# Patient Record
Sex: Female | Born: 1955 | Race: White | Hispanic: No | Marital: Married | Smoking: Former smoker
Health system: Southern US, Academic
[De-identification: ages and names within clinical notes are randomized; demographics above are authoritative.]

## PROBLEM LIST (undated history)

## (undated) DIAGNOSIS — J45909 Unspecified asthma, uncomplicated: Secondary | ICD-10-CM

## (undated) DIAGNOSIS — E785 Hyperlipidemia, unspecified: Secondary | ICD-10-CM

## (undated) DIAGNOSIS — E119 Type 2 diabetes mellitus without complications: Secondary | ICD-10-CM

## (undated) DIAGNOSIS — I10 Essential (primary) hypertension: Secondary | ICD-10-CM

## (undated) DIAGNOSIS — K760 Fatty (change of) liver, not elsewhere classified: Secondary | ICD-10-CM

## (undated) HISTORY — DX: Fatty (change of) liver, not elsewhere classified: K76.0

## (undated) HISTORY — DX: Hyperlipidemia, unspecified: E78.5

---
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
## 1992-12-15 ENCOUNTER — Emergency Department (HOSPITAL_COMMUNITY): Payer: Self-pay

## 2016-07-06 ENCOUNTER — Ambulatory Visit: Payer: No Typology Code available for payment source | Attending: Orthopaedic Surgery | Admitting: Orthopaedic Surgery

## 2016-07-06 ENCOUNTER — Ambulatory Visit (HOSPITAL_BASED_OUTPATIENT_CLINIC_OR_DEPARTMENT_OTHER): Payer: Self-pay | Admitting: Orthopaedic Surgery

## 2016-07-06 VITALS — BP 152/78 | HR 73 | Ht 63.0 in | Wt 195.3 lb

## 2016-07-06 DIAGNOSIS — I10 Essential (primary) hypertension: Secondary | ICD-10-CM | POA: Insufficient documentation

## 2016-07-06 DIAGNOSIS — M898X9 Other specified disorders of bone, unspecified site: Secondary | ICD-10-CM

## 2016-07-06 DIAGNOSIS — Z7984 Long term (current) use of oral hypoglycemic drugs: Secondary | ICD-10-CM | POA: Insufficient documentation

## 2016-07-06 DIAGNOSIS — Z79899 Other long term (current) drug therapy: Secondary | ICD-10-CM | POA: Insufficient documentation

## 2016-07-06 DIAGNOSIS — M7061 Trochanteric bursitis, right hip: Secondary | ICD-10-CM | POA: Insufficient documentation

## 2016-07-06 DIAGNOSIS — E1140 Type 2 diabetes mellitus with diabetic neuropathy, unspecified: Secondary | ICD-10-CM | POA: Insufficient documentation

## 2016-07-06 DIAGNOSIS — Z7982 Long term (current) use of aspirin: Secondary | ICD-10-CM | POA: Insufficient documentation

## 2016-07-06 DIAGNOSIS — Z809 Family history of malignant neoplasm, unspecified: Secondary | ICD-10-CM | POA: Insufficient documentation

## 2016-07-06 MED ORDER — DIAZEPAM 5 MG TABLET
5.0000 mg | ORAL_TABLET | Freq: Four times a day (QID) | ORAL | 0 refills | Status: DC | PRN
Start: 2016-07-06 — End: 2023-02-21

## 2016-07-06 NOTE — Addendum Note (Signed)
Addended by: Lona KettleSKIDMORE, Fedora Knisely L on: 07/06/2016 01:35 PM     Modules accepted: Orders

## 2016-07-07 NOTE — H&P (Signed)
PATIENT NAME: [name] [name], [name] [name]   HOSPITAL NUMBER:  [id number]  DATE OF SERVICE: 07/06/2016  DATE OF BIRTH:  01/09/1956    HISTORY AND PHYSICAL    CHIEF COMPLAINT:  Referral for evaluation of a right femur lesion.    HISTORY OF PRESENT ILLNESS:  [name] [name] is a 61 year old female here for evaluation of a lesion found at her right greater trochanter region.  She reports that this was identified for a workup of right lower extremity pain.  Given the finding of a lytic lesion which was read by radiology as a bone island and her strong family history of cancer, she was referred to our clinic for further evaluation.  She points to the lateral aspect of the thigh as primarily the location of pain just from the greater trochanter down to the lateral thigh.  It is worse with sleeping when she sleeps on her right side, as well as driving a car, extending her leg, and increasing her stride when walking.  It has been going on for a total of 4 months.  She does not report an injury associated with it.  It is gradually getting worse.  She denies any symptoms in the left lower extremity.  She has an associated numb patch just once that occurred yesterday for a brief 30-minute period.  It never occurred again and this was at the mid lateral aspect of the thigh.  She does have baseline diabetic neuropathy; recent A1c was 11.  She denies masses at any locations.  She denies any abnormal weight loss and no prior cancer history for herself.  She denies any bowel or bladder issues. No saddle anesthesia.  She currently, sitting in the room, does not have any pain at said location.  She has been to the chiropractor without alleviation.  She has not had any injections into her back or her hip.  The best thing to alleviate the pain is deep tissue massage to the thigh.    REVIEW OF SYSTEMS:  Positive for visual problems, hearing problems, heartburn. Mood, depression, and anxiety she answers yes to. All other systems are negative, other than  stated in HPI.    PAST MEDICAL HISTORY:  High blood pressure, diabetes, vertigo.    MEDICATIONS:  1. Invokamet.   2. Lotrel.   3. Prilosec.   4. Aspirin 81 mg daily.   5. Vitamin D.   6. Zyrtec.    PAST SURGICAL HISTORY:  C-section, tonsillectomy, left forearm D and C.    ALLERGIES:  1. DILAUDID.    2. FLU SHOT.    3. PENICILLIN for which she does not know the reaction to.    4. She is allergic to LATEX.    SOCIAL HISTORY:  She denies smoking or drinking alcohol.  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    FAMILY HISTORY:  Positive for cancer in several members of her family and also positive for bladder, balance issues, eye issues, diabetes, hypertension, and lung issues.    PHYSICAL EXAMINATION:  Blood pressure 150/78, pulse 73, weight 88.6 kg, height 5 feet 3 inches.   General: No acute distress.   HEENT:  Normocephalic, atraumatic.   Pulmonary:  Nonlabored breathing.    Cardiovascular:  Regular rate.   Abdomen: Nondistended.   Skin:  Warm and dry.   Psych: Her mood is appropriate for clinical situation.   Extremities: Evaluation of the right lower extremity does not show any abrasions, lacerations, or ecchymosis.  She is  most tender about the greater trochanter and at the distal insertion of the gluteus maximus.  She has good range of motion of her hip and is painless.  No radiation of pain into the groin.  She is able to hold a straight leg raise without pain.  She has 5/5 quad strength and 5/5 strength in tib ant, EHL, FHL, and gastroc soleus complex and has normal sensation in L3 to S1 nerve distributions, 0 beats clonus, and negative straight leg raise.  She walks with a normal gait.    IMAGING:  X-ray reviewed on [facility] with Dr. [name] show a sclerotic lesion in the greater trochanter well defined.  There may be some lucency surrounding this area, but it could be some overlap of the greater trochanter.  There are no fractures.  No cortical involvement.  It does have appearance of being a bone  island.    ASSESSMENT:  A 61 year old female with:   1. Right tochanteric bursitis.   2. 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    PLAN:  We discussed with the patient that we believe her pain is primarily due to trochanteric bursitis.  Given that she has the lesion, which does appear to be benign on imaging, though she has a strong family history, we would like to make sure that this is not anything malignant.  We are going to get an MRI of the right pelvis.  We did give her a script for Valium 5 mg to take 1 of those 3 minutes prior to the MRI and the second 1 in addition if she requires it at the time of the MRI.  We will see her back after the MRI is complete for review of the results and further development of the plan.  The patient agrees with the plan and all questions answered.        Lucianne LeiMark C Plumby, MD  Resident   [place] [facility]    Modesto CharonBrock Anthony Nadiya Pieratt, MD  Assistant Professor   [place] [facility]              DD:  07/06/2016 16:21:01  DT:  07/07/2016 08:07:30 KF  D#:  [id number]    I saw and examined the patient.  I reviewed the resident's note.  I agree with the findings and plan of care as documented in the resident's note.  Any exceptions/additions are edited/noted.    [place] CharonBrock Anthony Anastasia Tompson, MD

## 2016-07-18 ENCOUNTER — Telehealth (HOSPITAL_BASED_OUTPATIENT_CLINIC_OR_DEPARTMENT_OTHER): Payer: Self-pay | Admitting: Physician Assistant

## 2016-07-18 NOTE — Telephone Encounter (Signed)
Summary: RE: f/up to MRI     [name] [name] pt-    Pt is calling stating she has an MRI scheduled for 6.15.18 and is wanting to know if Dr. Mardella [name] could see her on 6.18.18?  She states she would have to stay [place] in [place] but is willing to do so.  Please advise if I can schedule pt.    Thanks.         Will attempt to reschedule for 07/25/16 at 9:15 am UTC  Lona KettleStacy L Jabarri Stefanelli, [place]  07/18/2016, 16:11

## 2016-07-21 ENCOUNTER — Telehealth (HOSPITAL_COMMUNITY): Payer: Self-pay | Admitting: Physician Assistant

## 2016-07-21 NOTE — Telephone Encounter (Signed)
Summary: RE: medication for MRI     [name] pt-    Pt is calling stating she thought she was given a script for 2 pills for her to take before her MRI tomorrow and she can not find the script.  She is asking if this can be called into her pharmacy below asap, so she can pick this up tonight.  Thanks.      Preferred Pharmacy   [facility] - [place] - [address]   [address] [place] [id number]   Phone: [phone] Fax: [phone]   Not a 24 hour pharmacy; exact hours not known          I called the patient and she admits she was able to find the prescription and she is taking it now to the pharmacy to have this filled in order to take for her MRI.  She thanked me for the call.  Lona KettleStacy L Iridessa Harrow, PA  07/21/2016, 16:25

## 2016-07-22 ENCOUNTER — Ambulatory Visit
Admission: RE | Admit: 2016-07-22 | Discharge: 2016-07-22 | Disposition: A | Discharge status: Home / Self Care | Payer: No Typology Code available for payment source | Source: Ambulatory Visit | Attending: Orthopaedic Surgery | Admitting: Orthopaedic Surgery

## 2016-07-22 DIAGNOSIS — S73191A Other sprain of right hip, initial encounter: Secondary | ICD-10-CM

## 2016-07-22 DIAGNOSIS — M7061 Trochanteric bursitis, right hip: Secondary | ICD-10-CM

## 2016-07-22 DIAGNOSIS — Z809 Family history of malignant neoplasm, unspecified: Secondary | ICD-10-CM

## 2016-07-22 DIAGNOSIS — M898X9 Other specified disorders of bone, unspecified site: Secondary | ICD-10-CM

## 2016-07-25 ENCOUNTER — Encounter: Payer: Self-pay | Admitting: Orthopaedic Surgery

## 2016-07-25 ENCOUNTER — Ambulatory Visit: Payer: No Typology Code available for payment source | Attending: Orthopaedic Surgery | Admitting: Orthopaedic Surgery

## 2016-07-25 VITALS — BP 123/64 | HR 78 | Temp 97.9°F | Ht 63.39 in | Wt 190.5 lb

## 2016-07-25 DIAGNOSIS — M7061 Trochanteric bursitis, right hip: Secondary | ICD-10-CM | POA: Insufficient documentation

## 2016-07-25 DIAGNOSIS — M7060 Trochanteric bursitis, unspecified hip: Secondary | ICD-10-CM

## 2016-07-25 DIAGNOSIS — M898X9 Other specified disorders of bone, unspecified site: Secondary | ICD-10-CM

## 2016-07-25 DIAGNOSIS — M898X5 Other specified disorders of bone, thigh: Secondary | ICD-10-CM | POA: Insufficient documentation

## 2016-07-25 NOTE — Progress Notes (Signed)
PATIENT NAME: Dallie DadHATFIELD, Liliann S  HOSPITAL NUMBER:  [id number]  DATE OF SERVICE: 07/25/2016  DATE OF BIRTH:  03-18-1955    PROGRESS NOTE    SUBJECTIVE:  Ms. [name] [name] is a 61 year old female who is here for followup of MRI of her right pelvis.  This was obtained due to concern for a lesion in the right greater trochanter region.  It was incidentally found due to right lower extremity pain.  It is suspicious for a bone infarct; however, given a few other abnormal findings on x-ray and a strong family history of cancer, MRI was obtained.  Regarding her right hip pain, we thought it was related to trochanteric bursitis given the location of the pain with laying on that side and tenderness to palpation at that location.  This pain has not gotten any better.  No worse.  It is irritating her and bothers her on a daily basis.    OBJECTIVE:  No acute distress.  Head and neck atraumatic mood is appropriate for the clinical situation.  Evaluation of the right lower extremity, there are no abrasions, lacerations or ecchymosis.  She does have tenderness to palpation at the greater trochanter that is reproduced just a little bit distally down the IT band in the vastus lateralis.  She is neurovascularly intact distally.    IMAGING:  MRI was reviewed on PACS with Dr. [name] and shows the lesion on the greater trochanter. It is dark on T1, dark on T2 well-circumscribed.  There is some enhancement at the greater troch at the location of the troch bursitis.      ASSESSMENT:  A 61 year old female with:  1. Bone island, right greater trochanter.  2. Right trochanteric bursitis.    PLAN:  We discussed with the patient that the lesion is benign.  There is nothing to do for it.  We will see her back in 3 months.  We will get an x-ray of the right hip just to monitor it.  For the trochanteric bursitis, we gave her physical therapy script to work on stretching the IT band and general lower extremity stretching.  We will see her back in 3  months to see how she is doing and get the x-ray.  The patient agrees with the plan and all questions answered.        Lucianne LeiMark C Plumby, MD  Resident   [facility]    [place] CharonBrock Anthony Kaeya Schiffer, MD  Assistant Professor   [place] [facility]              DD:  07/25/2016 09:49:05  DT:  07/25/2016 19:13:46 JB  D#:  [id number]    I saw and examined the patient.  I reviewed the resident's note.  I agree with the findings and plan of care as documented in the resident's note.  Any exceptions/additions are edited/noted.    [place] CharonBrock Anthony Floriene Jeschke, MD

## 2016-07-28 ENCOUNTER — Other Ambulatory Visit (HOSPITAL_BASED_OUTPATIENT_CLINIC_OR_DEPARTMENT_OTHER): Payer: Self-pay

## 2016-08-03 ENCOUNTER — Other Ambulatory Visit (INDEPENDENT_AMBULATORY_CARE_PROVIDER_SITE_OTHER): Payer: Self-pay | Admitting: Radiology

## 2016-08-03 ENCOUNTER — Encounter (INDEPENDENT_AMBULATORY_CARE_PROVIDER_SITE_OTHER): Payer: No Typology Code available for payment source | Admitting: Orthopaedic Surgery

## 2016-10-31 ENCOUNTER — Encounter (HOSPITAL_BASED_OUTPATIENT_CLINIC_OR_DEPARTMENT_OTHER): Payer: Self-pay | Admitting: Orthopaedic Surgery

## 2016-10-31 ENCOUNTER — Ambulatory Visit (HOSPITAL_BASED_OUTPATIENT_CLINIC_OR_DEPARTMENT_OTHER): Payer: Self-pay | Admitting: Orthopaedic Surgery

## 2016-10-31 DIAGNOSIS — M7060 Trochanteric bursitis, unspecified hip: Secondary | ICD-10-CM

## 2016-10-31 NOTE — Telephone Encounter (Signed)
-----   Message from Risa Grill sent at 10/31/2016  8:37 AM EDT -----  Mardella Layman pt    Pt is asking for a new PT order.    Fax: [phone] / attnShanda Bumps @ [employer]    thanks

## 2016-11-01 ENCOUNTER — Telehealth (INDEPENDENT_AMBULATORY_CARE_PROVIDER_SITE_OTHER): Payer: Self-pay | Admitting: Physician Assistant

## 2016-11-01 NOTE — Telephone Encounter (Signed)
Message from Fredderick Erb sent at 11/01/2016 12:57 PM EDT      Summary: RE: order for PT     Mardella Layman ptShanda Bumps from [facility] is requesting an order for PT to be faxed to her.  Pt has an appt today at 2:30 and pt will need to be rescheduled if she doesn't receive it.     Fax: [phone].    Thanks.                   Call History         Type Contact Phone User     11/01/2016 12:55 PM Phone (Incoming) [facility] (Other) [phone] Fredderick Erb         PT order dated 10/31/16 faxed to above number.  Nilda Calamity, PA-C  11/01/2016, 13:06

## 2016-11-28 ENCOUNTER — Ambulatory Visit (HOSPITAL_BASED_OUTPATIENT_CLINIC_OR_DEPARTMENT_OTHER): Payer: Self-pay | Admitting: Orthopaedic Surgery

## 2016-11-28 ENCOUNTER — Ambulatory Visit
Admission: RE | Admit: 2016-11-28 | Discharge: 2016-11-28 | Disposition: A | Discharge status: Home / Self Care | Payer: No Typology Code available for payment source | Source: Ambulatory Visit | Attending: Orthopaedic Surgery | Admitting: Orthopaedic Surgery

## 2016-11-28 ENCOUNTER — Ambulatory Visit (HOSPITAL_BASED_OUTPATIENT_CLINIC_OR_DEPARTMENT_OTHER): Payer: No Typology Code available for payment source | Admitting: Orthopaedic Surgery

## 2016-11-28 ENCOUNTER — Ambulatory Visit (HOSPITAL_BASED_OUTPATIENT_CLINIC_OR_DEPARTMENT_OTHER): Payer: No Typology Code available for payment source

## 2016-11-28 ENCOUNTER — Encounter (HOSPITAL_BASED_OUTPATIENT_CLINIC_OR_DEPARTMENT_OTHER): Payer: Self-pay

## 2016-11-28 VITALS — BP 142/62 | HR 63 | Temp 97.3°F | Ht 64.29 in | Wt 184.3 lb

## 2016-11-28 DIAGNOSIS — M7061 Trochanteric bursitis, right hip: Secondary | ICD-10-CM | POA: Insufficient documentation

## 2016-11-28 DIAGNOSIS — M899 Disorder of bone, unspecified: Secondary | ICD-10-CM | POA: Insufficient documentation

## 2016-11-28 DIAGNOSIS — M25551 Pain in right hip: Secondary | ICD-10-CM

## 2016-11-28 DIAGNOSIS — M898X9 Other specified disorders of bone, unspecified site: Secondary | ICD-10-CM

## 2016-11-28 NOTE — Addendum Note (Signed)
Addended by: [place] CharonLINDSEY, Aveline Daus ANTHONY on: 11/28/2016 09:31 AM     Modules accepted: Orders

## 2016-11-28 NOTE — Progress Notes (Signed)
PATIENT NAME: [name] [name], [name] [name]  HOSPITAL NUMBER:  [id number]  DATE OF SERVICE: 11/28/2016  DATE OF BIRTH:  [birth year]    PROGRESS NOTE    CHIEF COMPLAINT:  Follow up right bone lesion.    SUBJECTIVE:  This is a 61 year old female, who is here for followup of her right bone lesion.  We had diagnosed her with a bone island of her right greater trochanter last visit.  We also diagnosed her with some right trochanteric bursitis, as well as IT band irritation.  She has been doing PT for these issues.  It has helped some, but she continues to have pain in her back and down the lateral side of her leg.  She has no pain in her hip.  No paresthesias down the leg.  No numbness or tingling.    OBJECTIVE:  Well-appearing female, in no acute distress.  In regards to right lower extremity, she is neurovascularly intact distally.  She has negative straight leg raise.  She has good internal and external rotation of her hip.  This does not really cause her much pain.  She is not really tender over her trochanteric bursa.  She does have some low back tenderness. BCR foot. SILT distal foot. Able to dorsiflex and plantar flex foot.    IMAGING:  X-rays were obtained today and reviewed in PACS with Dr. [name].  Does show that this bone lesion is stable and appears to be a bone island.    ASSESSMENT:  This is a 61 year old female with a bone island of her right greater trochanter and some low back hamstring issues.    PLAN:  The lesion is stable and benign.  There is nothing to do for it.  We will have her follow up on an as needed basis.  In regards to her pain, we did give her a physical therapy script to work on core strengthening, back strengthening, and stretching and hamstring stretching to hopefully help out with some of her low back issues and her hamstring pain.  We will have the patient follow up on an as-needed basis.  The patient is agreeable with the plan.  All questions were answered.        Gray BernhardtLunden Ryan, MD      [place] CharonBrock  Anthony Ervine Witucki, MD  Assistant Professor   [facility]              DD:  11/28/2016 10:07:28  DT:  11/28/2016 16:41:21 LB  D#:  [id number]    I saw and examined the patient.  I reviewed the resident'[name] note.  I agree with the findings and plan of care as documented in the resident'[name] note.  Any exceptions/additions are edited/noted.    [place] CharonBrock Anthony Andreya Lacks, MD

## 2016-11-28 NOTE — Addendum Note (Signed)
Addended by: Gray BernhardtYAN, Emilyanne Mcgough on: 11/28/2016 09:06 AM     Modules accepted: Orders

## 2020-01-15 ENCOUNTER — Other Ambulatory Visit (HOSPITAL_COMMUNITY): Payer: Self-pay

## 2020-01-15 LAB — EXTERNAL COVID-19 MOLECULAR RESULT: External 2019-n-CoV/SARS-CoV-2: POSITIVE — AB

## 2020-10-15 ENCOUNTER — Ambulatory Visit (HOSPITAL_COMMUNITY)
Admission: RE | Admit: 2020-10-15 | Discharge: 2020-10-15 | Disposition: A | Discharge status: Home / Self Care | Payer: Self-pay | Source: Ambulatory Visit | Admitting: Radiology

## 2021-01-25 ENCOUNTER — Ambulatory Visit (HOSPITAL_COMMUNITY)
Admission: RE | Admit: 2021-01-25 | Discharge: 2021-01-25 | Disposition: A | Discharge status: Home / Self Care | Payer: Self-pay | Source: Ambulatory Visit | Admitting: Radiology

## 2021-02-16 ENCOUNTER — Inpatient Hospital Stay
Admission: RE | Admit: 2021-02-16 | Discharge: 2021-02-16 | Disposition: A | Discharge status: Home / Self Care | Payer: Medicare Other | Source: Ambulatory Visit | Attending: Surgery | Admitting: Surgery

## 2021-02-16 ENCOUNTER — Ambulatory Visit (HOSPITAL_BASED_OUTPATIENT_CLINIC_OR_DEPARTMENT_OTHER): Payer: Self-pay | Admitting: Surgery

## 2021-02-16 DIAGNOSIS — R928 Other abnormal and inconclusive findings on diagnostic imaging of breast: Secondary | ICD-10-CM

## 2021-02-23 ENCOUNTER — Other Ambulatory Visit: Payer: Self-pay

## 2021-02-23 ENCOUNTER — Encounter (HOSPITAL_BASED_OUTPATIENT_CLINIC_OR_DEPARTMENT_OTHER): Payer: Self-pay | Admitting: Surgery

## 2021-02-23 ENCOUNTER — Ambulatory Visit: Payer: Medicare Other | Attending: Surgery | Admitting: Surgery

## 2021-02-23 VITALS — BP 141/94 | HR 74 | Temp 96.3°F | Resp 18 | Ht 63.0 in | Wt 195.9 lb

## 2021-02-23 DIAGNOSIS — R079 Chest pain, unspecified: Secondary | ICD-10-CM | POA: Insufficient documentation

## 2021-02-23 DIAGNOSIS — Z6834 Body mass index (BMI) 34.0-34.9, adult: Secondary | ICD-10-CM

## 2021-02-23 DIAGNOSIS — Z803 Family history of malignant neoplasm of breast: Secondary | ICD-10-CM | POA: Insufficient documentation

## 2021-02-23 DIAGNOSIS — N644 Mastodynia: Secondary | ICD-10-CM | POA: Insufficient documentation

## 2021-02-23 DIAGNOSIS — R0789 Other chest pain: Secondary | ICD-10-CM | POA: Insufficient documentation

## 2021-02-23 MED ORDER — FLAXSEED ORAL POWDER
25.0000 g | Freq: Every day | ORAL | 1 refills | Status: DC
Start: 2021-02-23 — End: 2023-02-21

## 2021-02-23 NOTE — Cancer Center Note (Unsigned)
BREAST SURGICAL ONCOLOGY CONSULTATION (H&P)    NAME:  [name] [name]   DOB:  1955-07-17  MRN:  [id number]  Date of service:   02/23/21    PCP: [facility]  Address: [address] [place] [address]    Chief Complaint: pain, left breast greater than right    HPI: This is a very pleasant 66 y.o. woman who presents to the [facility] for evaluation today of her self-detected, bilateral breast pain.  She presents with her daughter who provides minimal history.  The patient does obtain regular screening mammograms about every 1-2 years. She denies symptoms prior to her most recent [facility] MGM in September 2022, specifically she reports, no pain, no skin changes, no contralateral breast masses, no nipple discharge.  She denies a personal history of breast cancer and denies prior radiation treatment to the chest.  She denies prior breast core biopsy and denies prior breast surgery.    She presents today for pain and discomfort in the bilateral breasts.  This began on the left side in November 2022 (two months after screening mammogram showing only bilateral nonsuspicious intramammary lymph nodes) when she first noticed discomfort.  Discomfort started mostly on the left side, then later on she noticed on the right side as well.    Burning and pain.  Pain described as pain-pain and sometimes tenderness to touch.  Upon further questioning, she describes the pain as moderate.  Dull ache.  Has pain right now.  Pain not present all the time. Sometimes has no pain for 1-2 days but then returns.  At the time of onset, pain was present all the time, so overall improving. Reports holding breast did not help much.  Pain was sometimes worse than others.  No pattern noticed, and she could not pinpoint anything that would be associated with it.  Sometimes during housecleaning and sometimes at rest.  Mostly pain lasts for hours, sometimes will hurt only 20 minutes and then stop.  Pain reaches from sternum across top of  breast into upper outer quadrant and into armpit area and up towards shoulder at times.  Does not radiate down arm. Does not radiate to nipple, although she had some of the burning sensation to the nipple but no shooting pains.  Burning sensation is now only rarely.      Has not tried any remedies other than occasional aleve for severe pain that interferred with sleeping.    Had a fall in 2005 with C5-C6 injury, received PT and was off of work for a year.  Occasionally gets a massage for intermittent neck pain.  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     Her diagnostic workup and biopsy details are below.     Breast imaging and pathology summary:  LAST SCR MGM with tomosynethesis: 10/15/2020, findings include scattered fibroglandular densities (breast tissue density B). Bilateral breast - no mammographic abnormality in either breast.  Benign focal asymmetric density identified in LEFT breast, apparently stable. BI-RADS 2.      01/25/2021 Left breast US - few ovoid masses in UOW up to 11 mm in diameter, c/w intramammary lymph nodes.  No masses or cysts appreciated.  BI-RADS 2.    01/25/2021 Right breast US - 8 x 29 mm isoechoic to hyperechoic mass in right axillary tail, suggestive of lymph  node.  Otherwise no masses or cysts.  BI-RADS 0.    Pertinent family cancer history (based on three-generation family history obtained today):  MATERNAL FAMILY HISTORY  Breast:  Yes: mother, dx 58, deceased 70; maternal aunt, dx 63's, deceased 33's. Mother had 3 sisters and 9 brothers.    Ovarian: No  Pancreatic: No  Prostate: No  Melanoma: No  Patient reports she had nine (9) uncles, most of whom had cancer but she is unaware of type or ages.     PATERNAL FAMILY HISTORY  Breast:  No  Ovarian: No  Pancreatic: No  Prostate: No  Melanoma: No    Ashkenazi Jewish ancestry: none  Prior genetic testing:  none  Patient's son diagnosed with melanoma-in-situ    Breast Cancer Risk Assessment:  Menarche: 33, G2P2, first live birth at 39  postmenopausal (natural at age 74)  Hormone replacement therapy: No  Prior radiation to the chest: No        ROS: 13-system review is reported by patient, please see scanned New Patient Questionnaire.  She admits to night sweats for 20 years, weight fluctuation (up and down) of about 20 lbs, vertigo, chronic shortness of breath, chronic bronchitis, likely sleep apnea (no evaluation), nausea, GERD, h/o stomach ulcers, h/o colonoscopy, arthritis, rheumatism, back pain, weakness in the arms or legs, diabetes, anxiety/depression, and difficulty sleeping.  Remainder of review is negative.     PMH:  has no past medical history on file.  PSHx: No past surgical history on file.  SocHx:  reports that she has never smoked. She has never used smokeless tobacco. Retired and divorced.  Smoked 1 ppd until March 1999.   Allergies: is allergic to dilaudid [hydromorphone] and penicillins.  Current Outpatient Medications   Medication Sig   . Amlodipine-Benazepril 10-40 mg Oral Capsule Take 1 Cap by mouth Once a day   . aspirin (ECOTRIN) 81 mg Oral Tablet, Delayed Release (E.C.) Take 81 mg by mouth Once a day   . canagliflozin-metformin (INVOKAMET XR) 50-1,000 mg Oral tablet, IR & ER, biphasic 24hr Take by mouth Once a day   . ciprofloxacin HCl (CIPRO) 500 mg Oral Tablet Take 500 mg by mouth Twice daily   . diazePAM (VALIUM) 5 mg Oral Tablet Take 1 Tab (5 mg total) by mouth Every 6 hours as needed for Anxiety for up to 1 dose Take one pill 30 minutes prior to MRI  Take second pill at time of MRI if needed   . loratadine (CLARITIN) 10 mg Oral Tablet Take 10 mg by mouth Once a day   . omeprazole (PRILOSEC) 40 mg Oral Capsule, Delayed Release(E.C.) Take 1 Capsule (40 mg total) by mouth Once a day   . OZEMPIC 0.25 mg or 0.5 mg(2 mg/1.5 mL) Subcutaneous Pen Injector    . SYNJARDY 12.5-1,000 mg Oral Tablet    .  TRULICITY 1.5 99991111 mL Subcutaneous Pen Injector      Physical Exam:  Vital signs: BP (!) 141/94   Pulse 74   Temp 35.7 C (96.3 F) (Thermal Scan)   Resp 18   Ht 1.6 m (5\' 3" )   Wt 88.9 kg (195 lb 14.4 oz)   SpO2 96%   BMI 34.70 kg/m   GEN: Awake, alert, and oriented.  No apparent distress.  Climbs on and off the examining table without assistance.    HEENT: Normocephalic, atraumatic. EOMI. She is wearing a mask.    CV:  RRR, pedal pulses are palpable. Lower extremity edema absent.  PULM: normal inspiratory effort.   GI: Soft, not tender to palpation, no hepatomegaly.    M/S: Range of motion of bilateral appears normal.  No obvious extremity retraction or unilateral swelling.  She does not require mobility assistance and is able to ambulate independently. She has no C-spine tenderness nor stepoffs.  She has mild TTP along the thoracic spine, more so along the paraspinous muscles bilaterally.  Her lower trapezius and latissimus muscles are tender extending into the rhomboid area.  The deltoid muscles are not tender.   PSYCH: Appearance is well-groomed and calm.  Judgement appears intact.  Affect is appropriate to context.      LYMPHATICS: Cervical and supraclavicular lymph node basins palpation without adenopathy bilaterally. Right axilla palpates with no palpable nodes or concern for adenopathy. Left axilla palpates with no palpable nodes or concern for adenopathy.  She is tender to palpation in the deeper axillary tissues, along the lateral and posterior aspect of the pectoralis, the latissimus, and serratus muscles.      BREAST EXAM: She is examined seated, supine, and with motion of the pectoralis major muscles bilaterally.  On gross inspection, she has mild asymmetry.  The left breast is larger than the right minimally.  She has flattened C cup volume and grade 2/3 ptosis.  No retraction, dimpling, or bulge noted.  The nipple-areolar complexes are normal without flaking or rash.  I am unable to elicit  drainage from the bilateral nipples.    On palpation of the left breast, there is no palpable abnormality in the breast parenchyma, and isolation off the pectoralis results in very minimal tenderness while palpating the parenchyma.       On palpation of the right breast, similarly there is no palpable abnormality in the breast parenchyma, and isolation off the pectoralis results in very minimal tenderness while palpating the parenchyma.       IMAGING:  Outside review 02/25/2021:  Review of outside mammogram and breast ultrasound  images performed on October 15, 2020 and January 25, 2021.  Please note that CAD is not available for the review of these outside images.  FILMS COMPARED:  No comparisons were made when reading this study.  FINDINGS:  Mammogram:  The breasts have scattered areas of fibroglandular density.    Left  Asymmetry: There are 2 asymmetries seen in the central region of the left breast in the posterior depth on the CC view.  No tomosynthesis images are available and there are no prior examinations available for comparison.  Spot compression is recommended.    Right  There is no evidence of suspicious masses, calcifications, or other abnormal findings.    Ultrasound:  Ultrasound images of both breast demonstrate no mass or discrete lesion.  There are normal-appearing lymph nodes in both axilla.    IMPRESSION:  Special Views: Spot Compression Mammography with Tomosynthesis is recommended for the left breast.  Follow up mammogram in 1 year is recommended for the right breast.  Any further work up should be based on clinical findings is recommended for both breasts.      BI-RADS ATLAS category (left): 0 - Incomplete: Needs Additional Imaging Evaluation  BI-RADS ATLAS category (right): 1 - Negative    Tyrer-Cuzick Risk estimate:  10-year risk 4.7% (population 3.6%)  Lifetime risk 9.7% (population 7.4%)    ASSESSMENT/PLAN:    ICD-10-CM    1. Left-sided chest wall pain  R07.89 Refer to Physical  Therapy at [facility]     AMB CONSULT/REFERRAL MASSAGE  THERAPY-EXT      2. Breast pain, left  N64.4       3. Breast pain, right  N64.4       4. Family history of breast cancer  Z80.3            This is a pleasant 66 y.o. woman with family history of postmenopausal breast cancer who presents for bilateral breast pain (mastalgia).  Clinical breast exam shows no findings of concern, and her diagnostic mammogram likewise showed no breast or axillary findings suggestive of malignancy.  She is at mildly elevated risk for breast cancer based on her maternal family history; her lifetime risk for breast cancer based on the Tyrer-Cuzick risk model does not meet the 20% threshold to consider adding breast MRI to her annual screening mammograms.  Clinical breast exams should be considered every 6-12 months along with annual bilateral screening mammograms as per NCCN guidelines.      I discussed with the patient that I suspect her breast pain is related more to her underlying chest wall musculature and not so much her breast parenchyma.  I recommend she try symptomatic therapy for her breast pain; she was encouraged to continue the measures already in place (supportive sports bra, ice or heat as is comfortable, sleeping in a sports bra, and OTC medications such as tylenol and ibuprofen).  I recommend adding oral flaxseed powder added to her diet.  I explained that flaxseed powder has been shown in small clinical trials to reduce breast pain significantly over 1-2 months and to not expect a rapid response.  I also reviewed that breast pain is uncommonly associated with breast cancer and so my suspicion for malignancy is very low.  I reviewed that mastalgia is often idiopathic, and often spontaneously resolves after 4-6 months.  I strongly encouraged her to pursue these options and return in 8 weeks for a repeat exam to ensure stability and assess response to symptomatic therapy.  She will be contacted and asked to  return sooner should our radiologists' review of the referring institution's imaging prompt the need for additional diagnostic workup.  Furthermore, I suspect an element of her pain is related to her history of neck trauma and possible shoulder issues, both of which can cause pain into the ipsilateral breast.  I recommend she consider physical therapy and I provided her a prescription/referral for both PT and massage therapy.    She and her daughter were given the opportunity to ask questions and voiced understanding of the above.  She is comfortable with the plan discussed.  She will be contacted to schedule followup diagnostic mammogram based on our radiologist's secondary review of her breast imaging.  Otherwise, she will return in about 8 weeks for followup exam, sooner if changes occur.      Greater than 50% of the face-to-face 30-minute visit was spent in counseling and coordination of care.      PLAN:    - patient to investigate maternal family history as she is comfortable   - Physical therapy for neck/shoulder pain as likely source of breast pain   - Massage therapy for same   - Return in 8 weeks for followup (short interval) visit    Domenick Gong, MD 02/23/2021 15:40  Assistant Professor, Division of Surgical Oncology (Breast Surgery)  [facility] Department of Surgery  Pager [phone], cell [phone]    10 minutes spent on independent review of imaging and reports, pathology results, and other provider notes prior to visit on  same day   30 minutes spent in face-to-face patient evaluation and counseling  0 minutes spent in communication with other cancer care physician team members  15 minutes spent in documentation on the day of this visit   Total time in counseling and coordination of care on day of visit: 55 minutes

## 2021-02-25 ENCOUNTER — Other Ambulatory Visit (HOSPITAL_COMMUNITY): Payer: Self-pay

## 2021-02-26 ENCOUNTER — Other Ambulatory Visit (HOSPITAL_BASED_OUTPATIENT_CLINIC_OR_DEPARTMENT_OTHER): Payer: Self-pay | Admitting: Surgery

## 2021-02-26 DIAGNOSIS — R928 Other abnormal and inconclusive findings on diagnostic imaging of breast: Secondary | ICD-10-CM

## 2021-03-02 ENCOUNTER — Ambulatory Visit (HOSPITAL_BASED_OUTPATIENT_CLINIC_OR_DEPARTMENT_OTHER): Payer: Self-pay | Admitting: Surgery

## 2021-04-20 ENCOUNTER — Ambulatory Visit (INDEPENDENT_AMBULATORY_CARE_PROVIDER_SITE_OTHER): Payer: Self-pay | Admitting: Rehabilitative and Restorative Service Providers"

## 2021-04-20 ENCOUNTER — Other Ambulatory Visit: Payer: Self-pay

## 2021-04-20 ENCOUNTER — Encounter (HOSPITAL_BASED_OUTPATIENT_CLINIC_OR_DEPARTMENT_OTHER): Payer: Self-pay | Admitting: Surgery

## 2021-04-20 ENCOUNTER — Inpatient Hospital Stay (HOSPITAL_BASED_OUTPATIENT_CLINIC_OR_DEPARTMENT_OTHER)
Admission: RE | Admit: 2021-04-20 | Discharge: 2021-04-20 | Disposition: A | Discharge status: Home / Self Care | Payer: Medicare Other | Source: Ambulatory Visit

## 2021-04-20 ENCOUNTER — Ambulatory Visit: Payer: Medicare Other | Attending: Surgery | Admitting: Surgery

## 2021-04-20 ENCOUNTER — Ambulatory Visit (HOSPITAL_BASED_OUTPATIENT_CLINIC_OR_DEPARTMENT_OTHER): Payer: Medicare Other | Admitting: Surgery

## 2021-04-20 VITALS — BP 148/56 | HR 68 | Temp 96.2°F | Resp 18 | Ht 63.0 in | Wt 190.7 lb

## 2021-04-20 DIAGNOSIS — N644 Mastodynia: Secondary | ICD-10-CM | POA: Insufficient documentation

## 2021-04-20 DIAGNOSIS — N6489 Other specified disorders of breast: Secondary | ICD-10-CM

## 2021-04-20 DIAGNOSIS — Z803 Family history of malignant neoplasm of breast: Secondary | ICD-10-CM | POA: Insufficient documentation

## 2021-04-20 DIAGNOSIS — R928 Other abnormal and inconclusive findings on diagnostic imaging of breast: Secondary | ICD-10-CM

## 2021-04-20 NOTE — Progress Notes (Signed)
BREAST CLINIC, [address] [place]  [place] [id number]  Operated by [facility]  History and Physical     Name: [name] [name] MRN:  [id number]   Date: 04/20/2021 Age: 66 y.o.       Chief Complaint: No chief complaint on file.    History of Present Illness   Ms. Treminio is a 66 y.o. year old female who comes to clinic 8 weeks after initial visit for bilateral mastalgia, left pain worse than right. From her prior clinic visit,     This is a very pleasant 65 y.o. woman who presents to the [facility] for evaluation today of her self-detected, bilateral breast pain.  She presents with her daughter who provides minimal history.  The patient does obtain regular screening mammograms about every 1-2 years. She denies symptoms prior to her most recent [facility] MGM in September 2022, specifically she reports, no pain, no skin changes, no contralateral breast masses, no nipple discharge.  She denies a personal history of breast cancer and denies prior radiation treatment to the chest.  She denies prior breast core biopsy and denies prior breast surgery.    She presents today for pain and discomfort in the bilateral breasts.  This began on the left side in November 2022 (two months after screening mammogram showing only bilateral nonsuspicious intramammary lymph nodes) when she first noticed discomfort.  Discomfort started mostly on the left side, then later on she noticed on the right side as well.    Burning and pain.  Pain described as pain-pain and sometimes tenderness to touch.  Upon further questioning, she describes the pain as moderate.  Dull ache.  Has pain right now.  Pain not present all the time. Sometimes has no pain for 1-2 days but then returns.  At the time of onset, pain was present all the time, so overall improving. Reports holding breast did not help much.  Pain was sometimes worse than others.  No pattern noticed, and she could not pinpoint  anything that would be associated with it.  Sometimes during housecleaning and sometimes at rest.  Mostly pain lasts for hours, sometimes will hurt only 20 minutes and then stop.  Pain reaches from sternum across top of breast into upper outer quadrant and into armpit area and up towards shoulder at times.  Does not radiate down arm. Does not radiate to nipple, although she had some of the burning sensation to the nipple but no shooting pains.  Burning sensation is now only rarely.      Has not tried any remedies other than occasional aleve for severe pain that interferred with sleeping.    Had a fall in 2005 with C5-C6 injury, received PT and was off of work for a year.  Occasionally gets a massage for intermittent neck pain.  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     Today, she reports that 3 weeks after her prior visit, her breast pain subsided. She reported migratory dull pain in varying locations. She also reported isolated right wrist swelling and erythema while out of town. She received a steroid shot which resolved those symptoms. She recently underwent cervical spine and lumbar spine XRays, which revealed some mild degenerative changes. She denies upper extremity neuropathy. She reports that massage has  helped alleviate pain.     Patient Active Problem List    Diagnosis   . Left-sided chest wall pain   . Breast pain, left   . Breast pain, right   . Family history of breast cancer       Current Outpatient Medications   Medication Sig   . Amlodipine-Benazepril 10-40 mg Oral Capsule Take 1 Cap by mouth Once a day   . aspirin (ECOTRIN) 81 mg Oral Tablet, Delayed Release (E.C.) Take 81 mg by mouth Once a day   . canagliflozin-metformin (INVOKAMET XR) 50-1,000 mg Oral tablet, IR & ER, biphasic 24hr Take by mouth Once a day   . diazePAM (VALIUM) 5 mg Oral Tablet Take 1 Tab (5 mg  total) by mouth Every 6 hours as needed for Anxiety for up to 1 dose Take one pill 30 minutes prior to MRI  Take second pill at time of MRI if needed   . Flaxseed Oral Powder Take 25 g by mouth Once a day   . loratadine (CLARITIN) 10 mg Oral Tablet Take 10 mg by mouth Once a day   . omeprazole (PRILOSEC) 40 mg Oral Capsule, Delayed Release(E.C.) Take 1 Capsule (40 mg total) by mouth Once a day   . SYNJARDY 12.5-1,000 mg Oral Tablet    . TRULICITY 1.5 99991111 mL Subcutaneous Pen Injector      Allergies   Allergen Reactions   . Dilaudid [Hydromorphone]    . Penicillins      Family Medical History:    None         Social History     Tobacco Use   . Smoking status: Never   . Smokeless tobacco: Never   Substance Use Topics   . Alcohol use: Not on file      Review of Systems  A 12 point review of systems was conducted and was negative except as listed in HPI    Examination:  BP (!) 148/56   Pulse 68   Temp 35.7 C (96.2 F) (Thermal Scan)   Resp 18   Ht 1.6 m (5\' 3" )   Wt 86.5 kg (190 lb 11.2 oz)   SpO2 94%   BMI 33.78 kg/m     General: Alert, oriented, no acute distress  HEENT: Normocephalic, atraumatic, cervical spine non-tender, full range of motion, no step-offs or deformities  CV: Regular rate and rhythm  Pulm: No accessory muscle use or dyspnea  LEFT breast: no masses, no dimpling, no skin changes, no adenopathy, no nipple inversion  RIGHT breast: palpable areas of normal breast tissue, no skin changes, adenopathy, or nipple inversion  MSK: Full ROM in upper and lower extremities    Data reviewed:  Left diagnostic mammogram:    FINDINGS:  The left breast has scattered areas of fibroglandular density.    Left  Asymmetry: There are 2 similar asymmetries seen in the central region of the left breast in the posterior depth on the CC view.  Prior films are now available for comparison, and these two asymmetries are stable back to at least 2019.    IMPRESSION:  Return to regular screening mammogram schedule is  recommended for the left breast.    BI-RADS ATLAS category (left): 2 - Benign  Assessment and Plan  Problem List Items Addressed This Visit    None    This is a 67 year old female presenting who had a 2 month history of bilateral mastalgia. Since her prior visit, her mastalgia has resolved.  She denies any other concerning breast symptoms. From her history and examination, primary breast pathology is unlikely. Her symptoms seem more consistent with musculoskeletal pain. She will discuss this further with her PCP who is recommending MRI per patient report. We recommend continuing annual screening mammograms and watching for any concerning breast changes.     Lucile Crater, MD     Breast surgery attending note  I was physically present and examined the patient with the resident present.  I have reviewed their note as documented above and concur with the assessment and plan as noted. Any exceptions/additions are edited/noted below.    Briefly, this is a pleasant, 66 y.o. woman returning for short interval followup of bilateral mastalgia.  She started oral flaxseed powder briefly.  She reports her breast pain is completely resolved; however, she has varying pains in multiple other areas of her body.  She underwent spine X-rays ordered by another physician which showed no fracture and possible stenosis; patient reports MRI is planned.  She denies new breast-related complaints and her breast imaging in December was negative.      Clinical breast examination shows normal fibroglandular tissue with no change since her prior exam, specifically, no skin lesions, palpable masses, nipple discharge, or pain on either side.       ICD-10-CM    1. Breast pain, left  N64.4       2. Breast pain, right  N64.4         This is a pleasant 66 y.o. woman returning for short interval followup.  She has a family history of postmenopausal breast cancer and presented in January 2023 for bilateral breast pain (mastalgia).  Repeat clinical  breast exam today shows no findings of concern, and her breast imaging in September and December likewise showed no breast or axillary findings suggestive of malignancy.  She is at mildly elevated risk for breast cancer based on her maternal family history; her lifetime risk for breast cancer based on the Tyrer-Cuzick risk model does not meet the 20% threshold to consider adding breast MRI to her annual screening mammograms.  Clinical breast exams should be considered every 6-12 months along with annual bilateral screening mammograms as per current NCCN guidelines.       She was given the opportunity to ask questions and these were answered to the best of my ability.  I recommend she followup with her primary care provider to address her other areas of pain and her spine workup.  She should resume annual bilateral screening mammograms and her next screen will be due in September 2023.  She may return to the breast center on a prn basis.  She agrees with this plan.     Domenick Gong, MD 04/21/2021 07:40  Assistant Professor, Division of Surgical Oncology (Breast Surgery)  [facility] Department of Surgery  Pager [phone], cell [phone]

## 2021-04-21 ENCOUNTER — Encounter (HOSPITAL_BASED_OUTPATIENT_CLINIC_OR_DEPARTMENT_OTHER): Payer: Self-pay | Admitting: Surgery

## 2021-04-30 IMAGING — MR MRI CERVICAL SPINE WITHOUT CONTRAST
4 of 5 series · 24 of 48 positions shown · IV contrast (gadolinium)
Comparison: None available.

﻿EXAM:  90888   MRI CERVICAL SPINE WITHOUT CONTRAST
INDICATION: Neck pain with bilateral upper extremity radiculopathy.
TECHNIQUE: Multiplanar multisequential MRI of the cervical spine was performed without gadolinium contrast.

[Series 5: T2 · sagittal · 3.0mm · 0.62mm/px · 8 of 15 slices shown (1 of 2)]
[im 1/15]
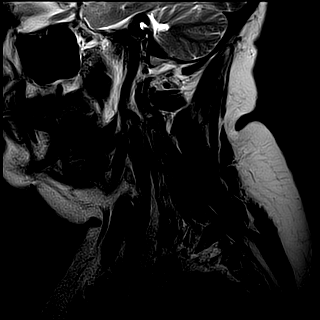
[im 3/15]
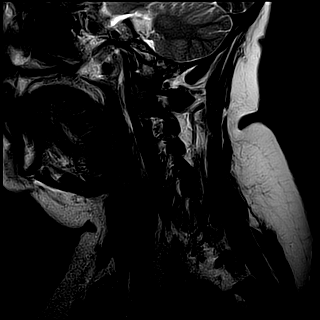
[im 5/15]
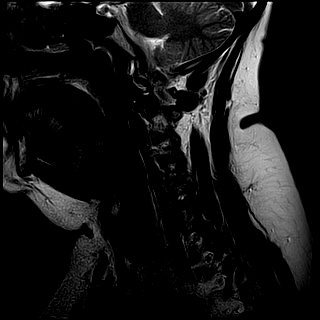
[im 7/15]
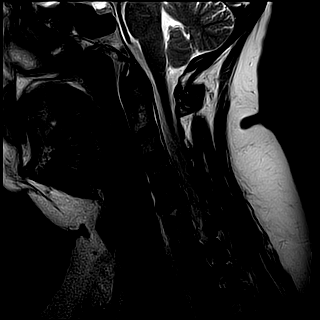
[im 9/15]
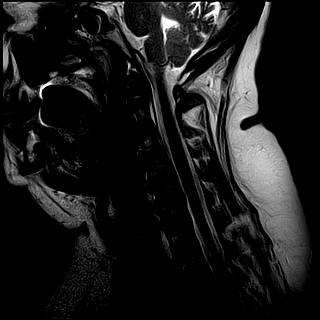
[im 11/15]
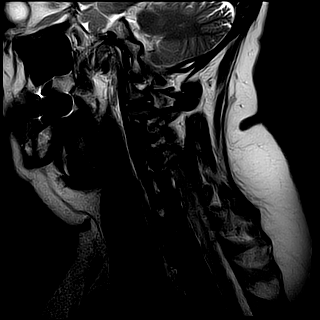
[im 13/15]
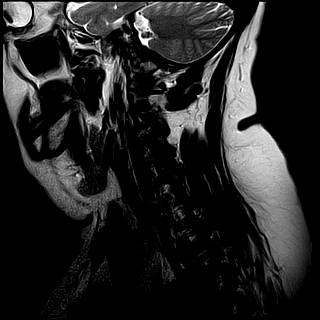
[im 15/15]
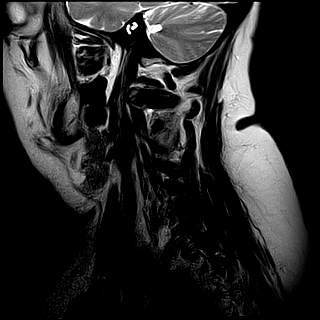

[Series 7: T1 · sagittal · 3.0mm · 0.47mm/px · 3 of 15 slices shown]
[im 2/15]
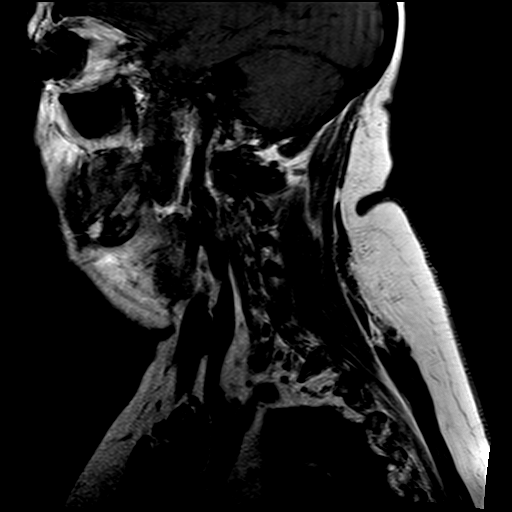
[im 8/15]
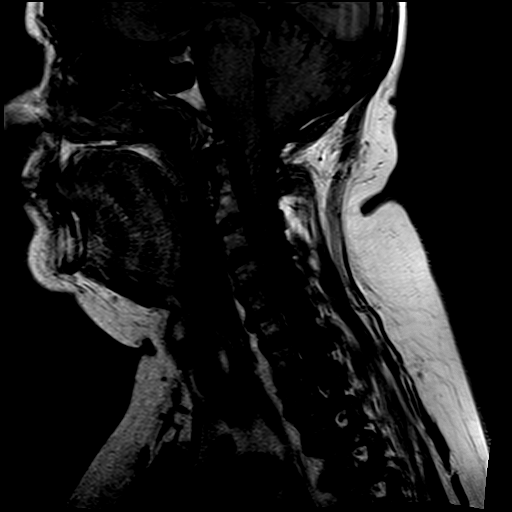
[im 13/15]
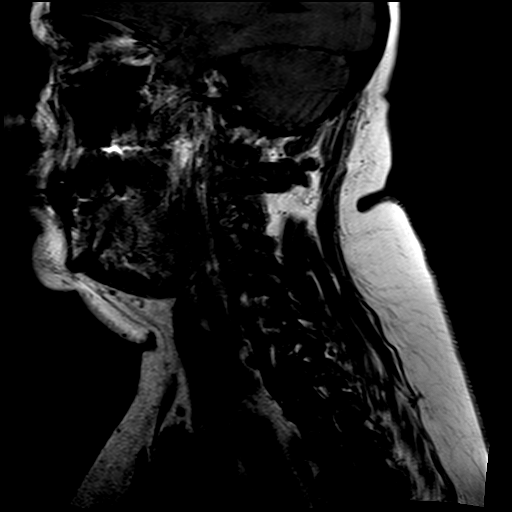

[Series 8: STIR · sagittal · 3.0mm · 0.39mm/px · 3 of 15 slices shown]
[im 2/15]
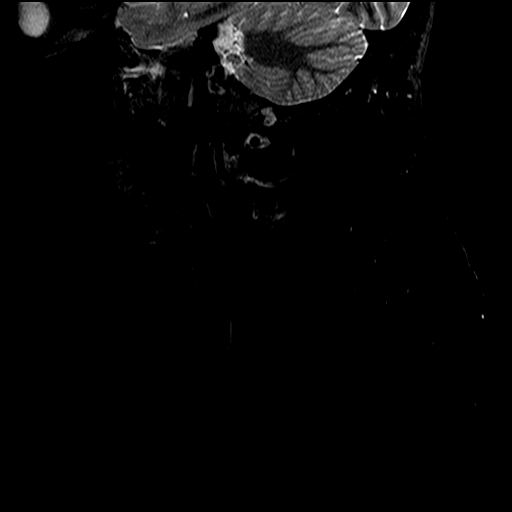
[im 8/15]
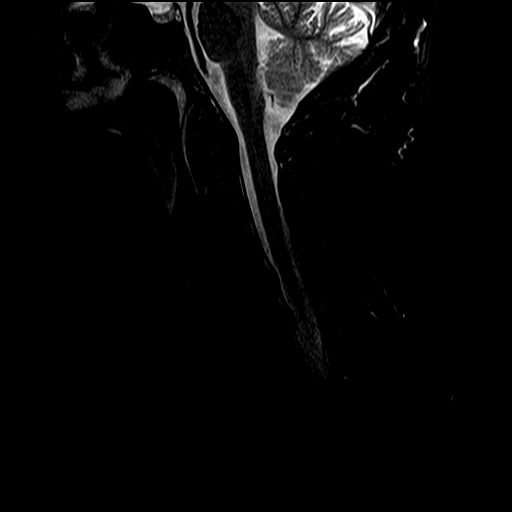
[im 13/15]
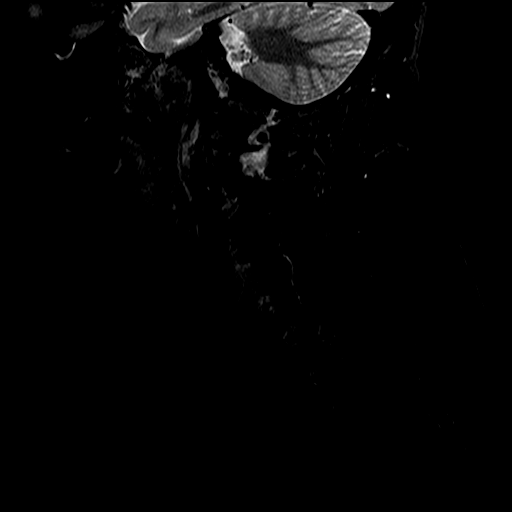

[Series 9: T2 · axial · 3.0mm · 0.39mm/px · z∈[-128,-54]mm · 10 of 18 slices shown (2 of 2)]
[im 1/18]
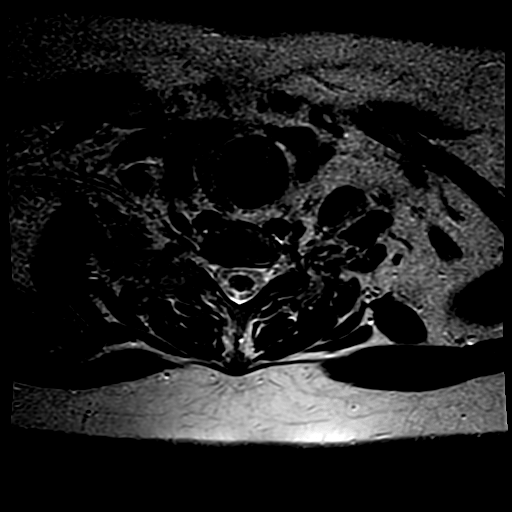
[im 2/18]
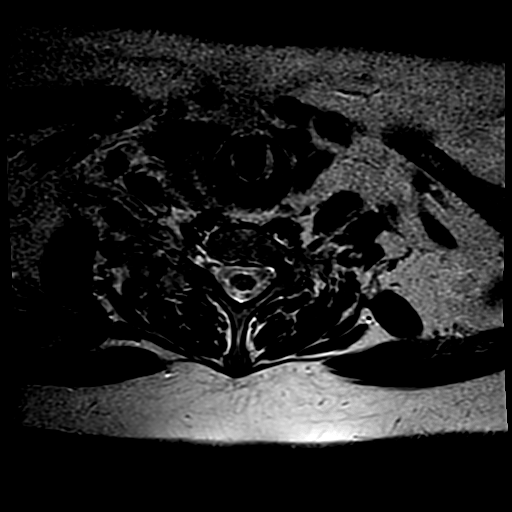
[im 4/18]
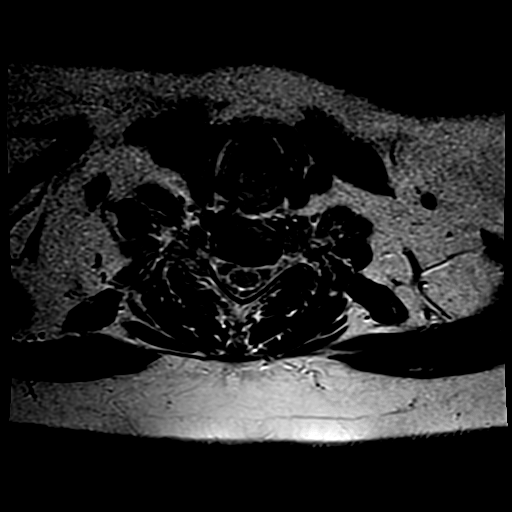
[im 6/18]
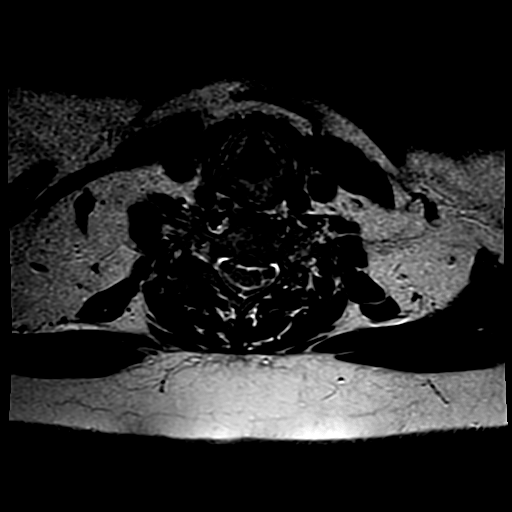
[im 7/18]
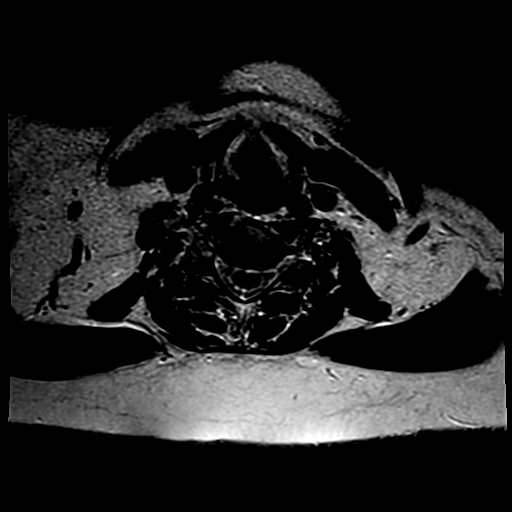
[im 9/18]
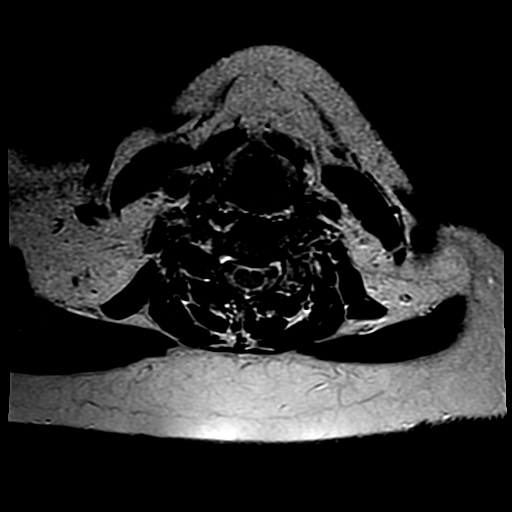
[im 11/18]
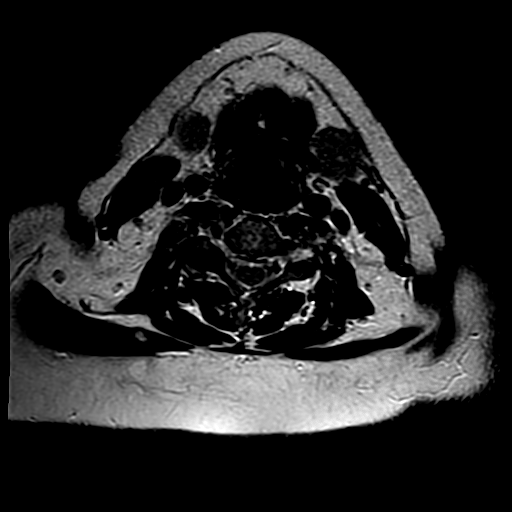
[im 12/18]
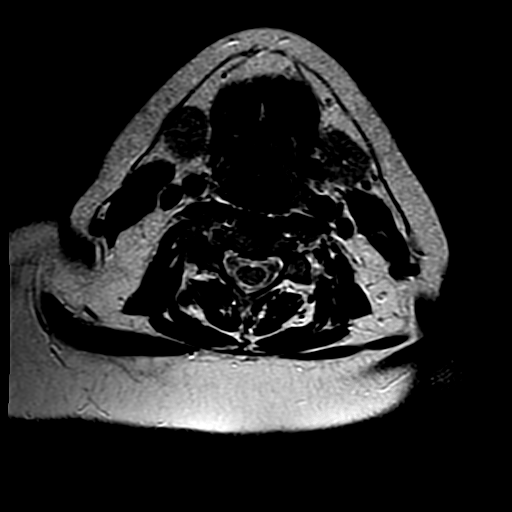
[im 14/18]
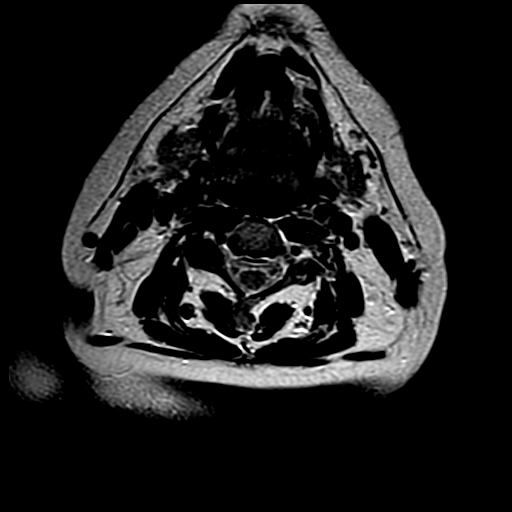
[im 16/18]
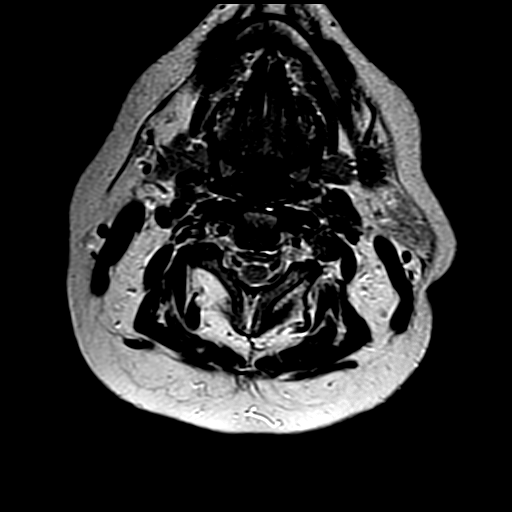

[24 of 48 positions shown; findings below may reference images not displayed]

FINDINGS: Straightening of cervical lordosis is likely positional. Bone marrow signal intensity is normal. There is no acute fracture or subluxation. Visualized spinal cord is normal in signal intensity without evidence of compression at any level. 

C2-3 and C3-4 levels are unremarkable. 

At C4-5 level, there is a minimal bulging annulus, minimally effacing the ventral CSF.  There is no significant neural foraminal stenosis. 

At C5-6 level, there is a small broad-based central disc bulge mildly effacing the ventral CSF. There is no significant neural foraminal stenosis. 

At C6-7 level, there is a small broad-based central disc bulge mildly effacing the ventral CSF. There is no significant neural foraminal stenosis. 

C7-T1 level and paraspinal soft tissues are also unremarkable.
IMPRESSION: No significant disc herniation, spinal canal or neural foraminal stenosis at any level.

## 2021-10-19 IMAGING — MR MRI LUMBAR SPINE WITHOUT CONTRAST
4 of 7 series · 26 of 48 positions shown · IV contrast (gadolinium)
Comparison: None available.

﻿EXAM:  [id number]   MRI LUMBAR SPINE WITHOUT CONTRAST
INDICATION: 66-year-old with persistent low back pain and radicular symptoms to the right hip.  No history of malignancy or back surgery.
TECHNIQUE: Multiplanar, multisequential MRI of the lumbar spine was performed without gadolinium contrast.

[Series 5: T2 · sagittal · 4.0mm · 0.94mm/px · 4 of 13 slices shown (1 of 4)]
[im 1/13]
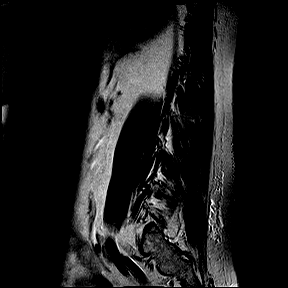
[im 5/13]
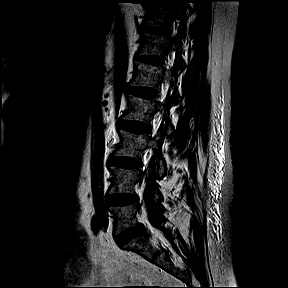
[im 9/13]
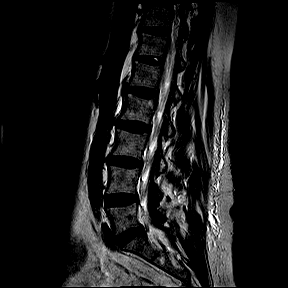
[im 13/13]
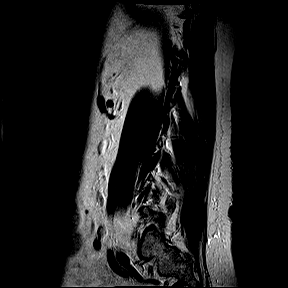

[Series 8: T2 · axial · 4.0mm · 0.52mm/px · z∈[-122,+89]mm · 9 of 23 slices shown (2 of 4)]
[im 1/23]
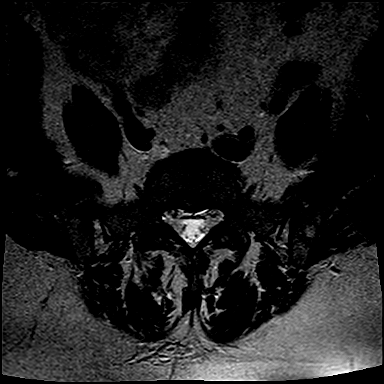
[im 3/23]
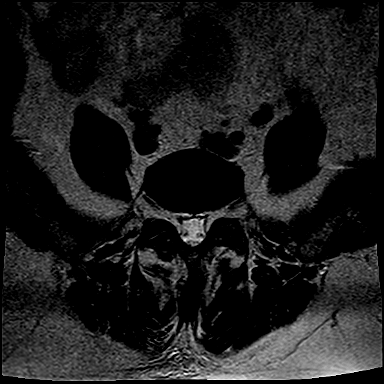
[im 6/23]
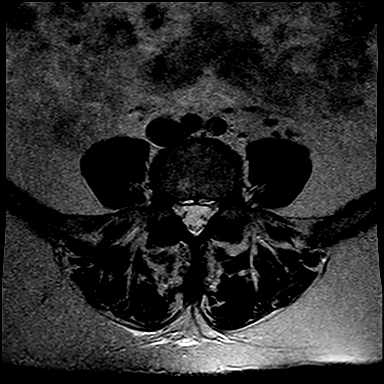
[im 9/23]
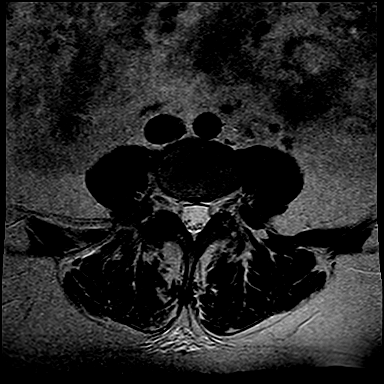
[im 12/23]
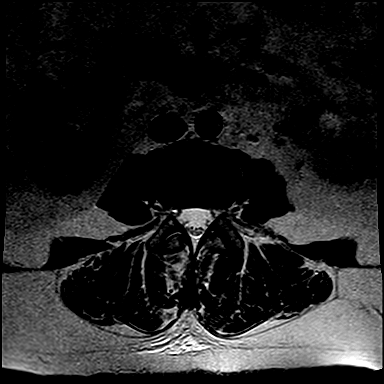
[im 14/23]
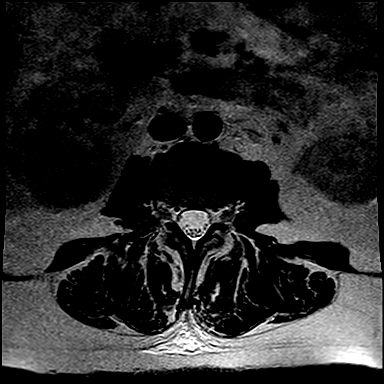
[im 17/23]
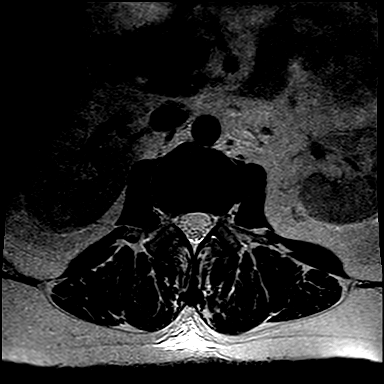
[im 20/23]
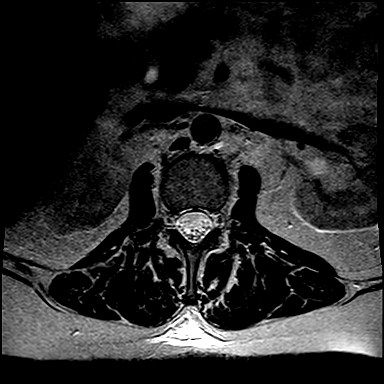
[im 23/23]
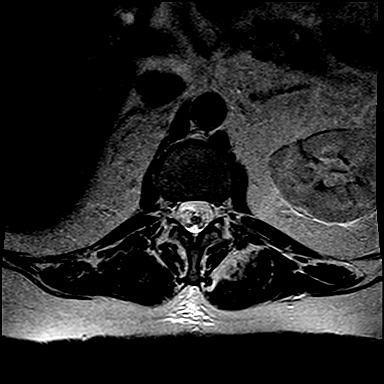

[Series 9: T2 · coronal · 6.0mm · 0.82mm/px · 8 of 20 slices shown (3 of 4)]
[im 1/20]
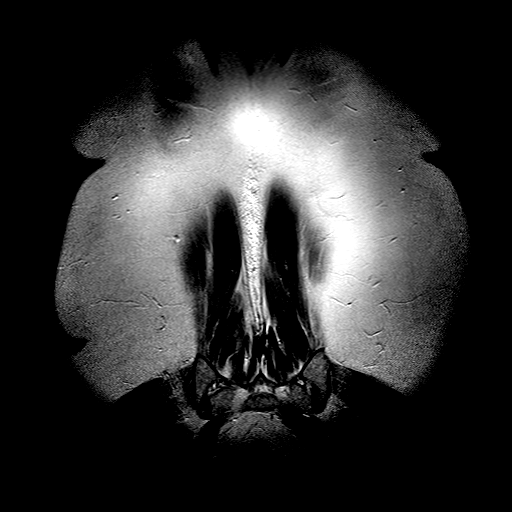
[im 3/20]
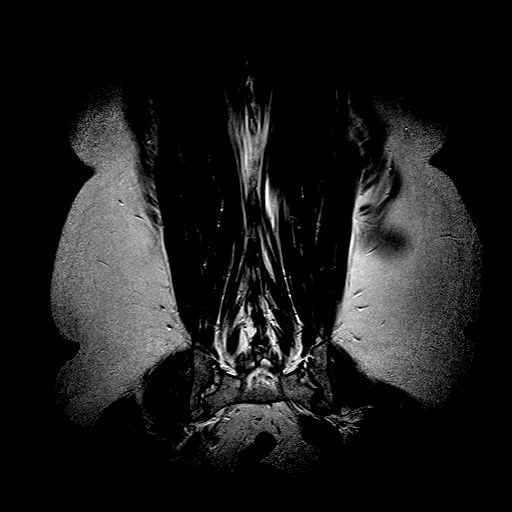
[im 6/20]
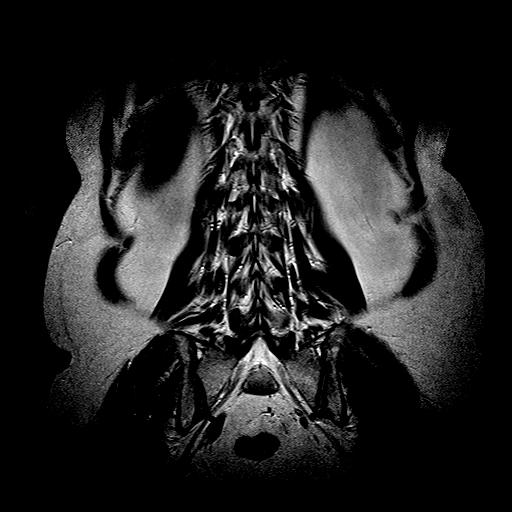
[im 9/20]
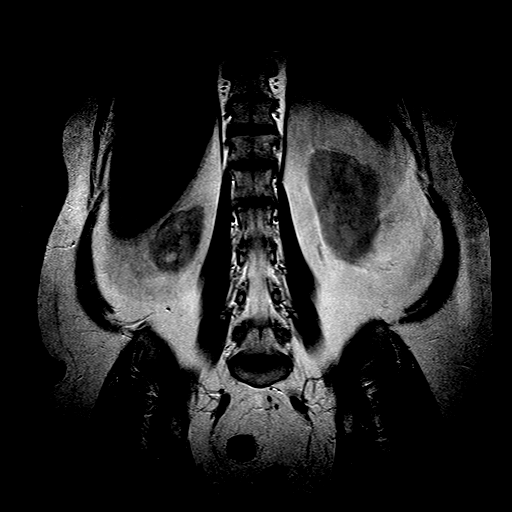
[im 11/20]
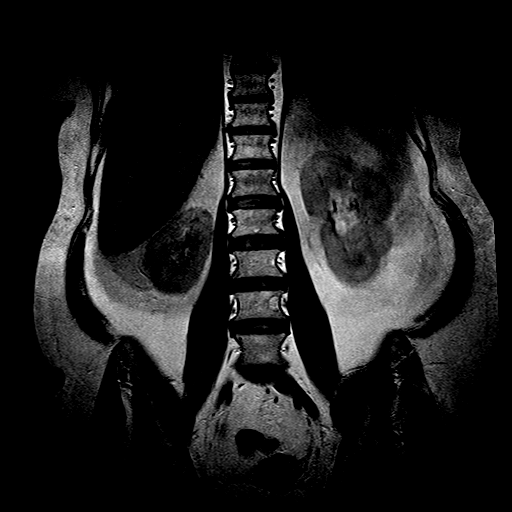
[im 14/20]
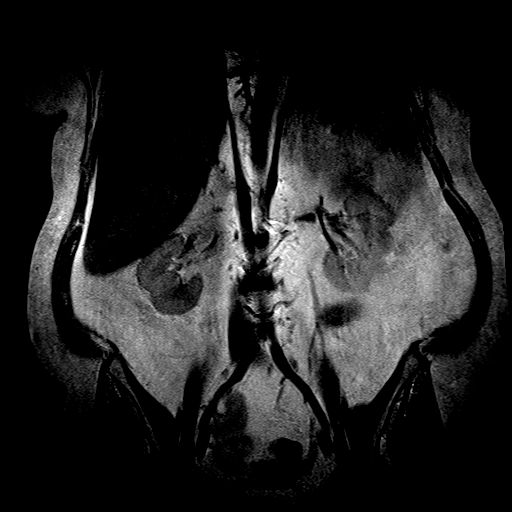
[im 17/20]
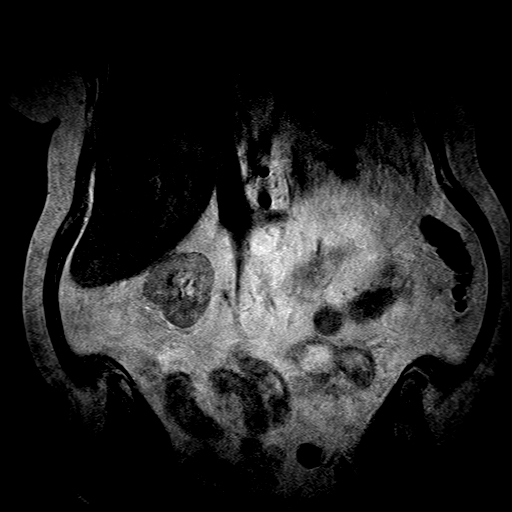
[im 20/20]
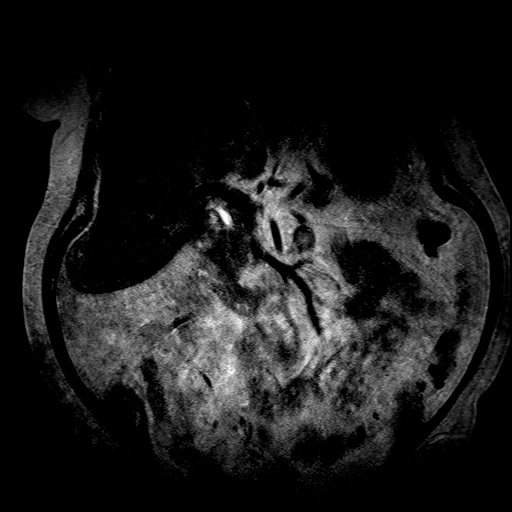

[Series 12: T2 · axial · 6.0mm · 0.82mm/px · z∈[-76,+36]mm · 5 of 20 slices shown (4 of 4)]
[im 1/20]
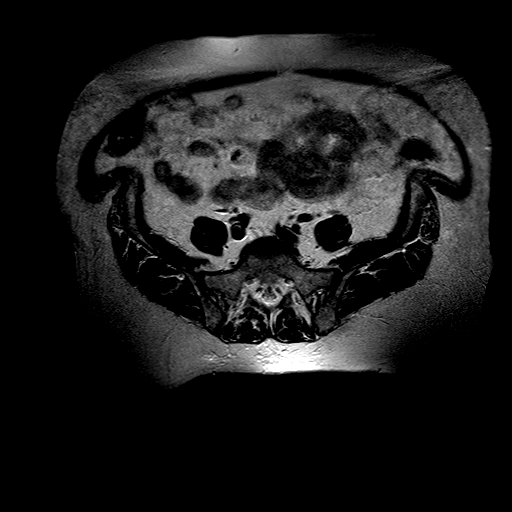
[im 3/20]
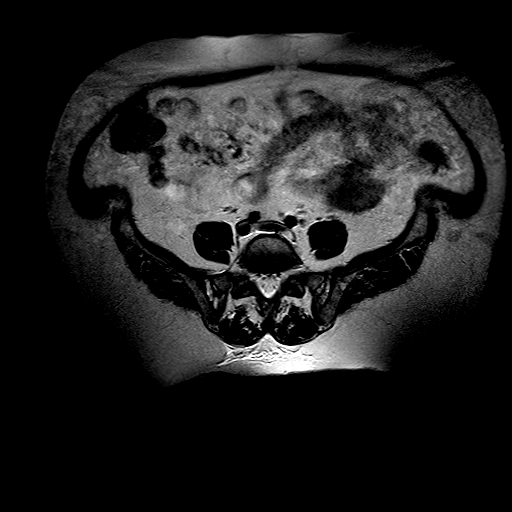
[im 6/20]
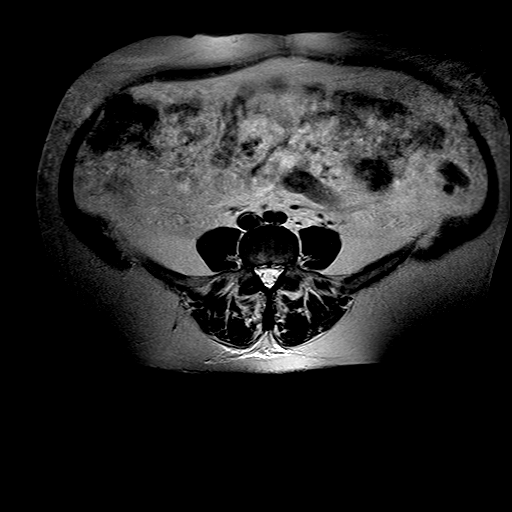
[im 11/20]
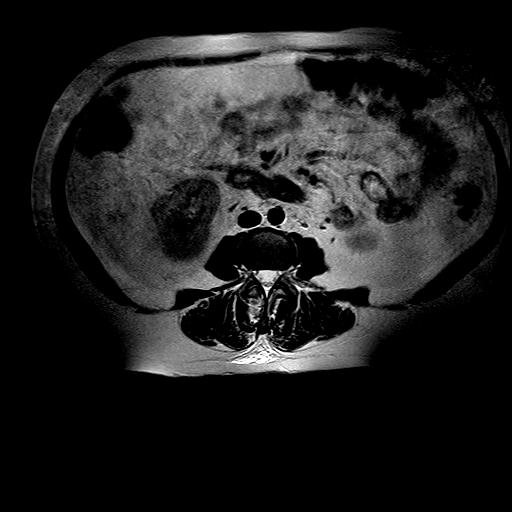
[im 17/20]
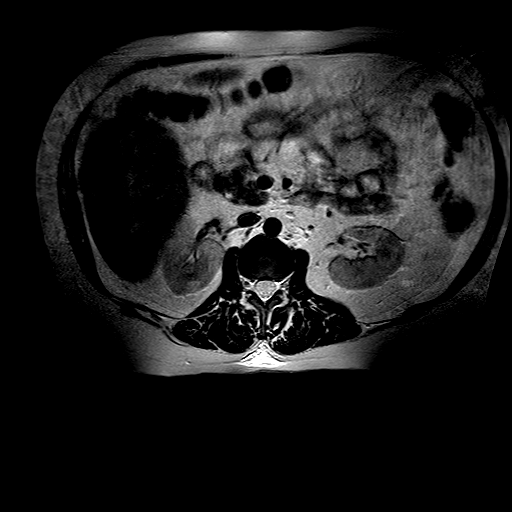

[26 of 48 positions shown; findings below may reference images not displayed]

FINDINGS: No acute or focal bone changes of lumbar vertebrae are seen. Lower cord and cauda equina are normal.

At L1-2 level, no focal disc lesions are seen. 

At L2-3 level, no focal disc lesions are seen.

At L3-4 level, minimal degenerative changes and facet arthropathy are causing minimal compromise of both lateral recess.  

At L4-5 level, bilateral facet arthropathy and degenerative disc changes are causing mild compromise of both lateral recess.

At L5-S1 level, no significant focal disc lesions are seen.

Paravertebral soft tissues show suggestion of hepatomegaly measuring 20 cm in craniocaudal span.
IMPRESSION: 1. No acute or focal bone changes of lumbar vertebrae.

2. Findings at each disc level are described above in detail.  

3. Hepatomegaly is suggested.

## 2022-07-18 ENCOUNTER — Other Ambulatory Visit (HOSPITAL_COMMUNITY): Payer: Self-pay

## 2022-07-18 DIAGNOSIS — Z1239 Encounter for other screening for malignant neoplasm of breast: Secondary | ICD-10-CM

## 2022-07-22 ENCOUNTER — Inpatient Hospital Stay
Admission: RE | Admit: 2022-07-22 | Discharge: 2022-07-22 | Disposition: A | Discharge status: Home / Self Care | Payer: Medicare Other | Source: Ambulatory Visit

## 2022-07-22 ENCOUNTER — Other Ambulatory Visit: Payer: Self-pay

## 2022-07-22 ENCOUNTER — Encounter (HOSPITAL_COMMUNITY): Payer: Self-pay

## 2022-07-22 DIAGNOSIS — Z1231 Encounter for screening mammogram for malignant neoplasm of breast: Secondary | ICD-10-CM | POA: Insufficient documentation

## 2022-07-22 DIAGNOSIS — Z1239 Encounter for other screening for malignant neoplasm of breast: Secondary | ICD-10-CM

## 2022-10-26 ENCOUNTER — Other Ambulatory Visit (HOSPITAL_COMMUNITY): Payer: Self-pay

## 2022-10-26 DIAGNOSIS — R109 Unspecified abdominal pain: Secondary | ICD-10-CM

## 2022-12-26 ENCOUNTER — Ambulatory Visit (HOSPITAL_COMMUNITY): Payer: Self-pay

## 2022-12-26 ENCOUNTER — Other Ambulatory Visit: Payer: Self-pay

## 2022-12-26 ENCOUNTER — Inpatient Hospital Stay
Admission: RE | Admit: 2022-12-26 | Discharge: 2022-12-26 | Disposition: A | Discharge status: Home / Self Care | Payer: Medicare Other | Source: Ambulatory Visit

## 2022-12-26 DIAGNOSIS — K838 Other specified diseases of biliary tract: Secondary | ICD-10-CM

## 2022-12-26 DIAGNOSIS — R109 Unspecified abdominal pain: Secondary | ICD-10-CM | POA: Insufficient documentation

## 2023-02-03 ENCOUNTER — Other Ambulatory Visit (HOSPITAL_COMMUNITY): Payer: Self-pay | Admitting: Nurse Practitioner

## 2023-02-03 ENCOUNTER — Ambulatory Visit
Admission: RE | Admit: 2023-02-03 | Discharge: 2023-02-03 | Disposition: A | Discharge status: Home / Self Care | Payer: Medicare Other | Source: Ambulatory Visit | Attending: Nurse Practitioner | Admitting: Nurse Practitioner

## 2023-02-03 ENCOUNTER — Other Ambulatory Visit: Payer: Self-pay

## 2023-02-03 DIAGNOSIS — R059 Cough, unspecified: Secondary | ICD-10-CM

## 2023-02-03 DIAGNOSIS — R0989 Other specified symptoms and signs involving the circulatory and respiratory systems: Secondary | ICD-10-CM | POA: Insufficient documentation

## 2023-02-20 ENCOUNTER — Emergency Department (HOSPITAL_COMMUNITY): Payer: Medicare Other

## 2023-02-20 ENCOUNTER — Other Ambulatory Visit: Payer: Self-pay

## 2023-02-20 ENCOUNTER — Encounter (HOSPITAL_COMMUNITY): Payer: Self-pay

## 2023-02-20 ENCOUNTER — Observation Stay
Admission: EM | Admit: 2023-02-20 | Discharge: 2023-02-23 | Disposition: A | Discharge status: Home / Self Care | Payer: Medicare Other | Attending: Internal Medicine | Admitting: Internal Medicine

## 2023-02-20 DIAGNOSIS — R0600 Dyspnea, unspecified: Secondary | ICD-10-CM

## 2023-02-20 DIAGNOSIS — I10 Essential (primary) hypertension: Secondary | ICD-10-CM | POA: Insufficient documentation

## 2023-02-20 DIAGNOSIS — Z792 Long term (current) use of antibiotics: Secondary | ICD-10-CM | POA: Insufficient documentation

## 2023-02-20 DIAGNOSIS — E119 Type 2 diabetes mellitus without complications: Secondary | ICD-10-CM | POA: Insufficient documentation

## 2023-02-20 DIAGNOSIS — J45909 Unspecified asthma, uncomplicated: Secondary | ICD-10-CM | POA: Insufficient documentation

## 2023-02-20 DIAGNOSIS — K760 Fatty (change of) liver, not elsewhere classified: Secondary | ICD-10-CM | POA: Insufficient documentation

## 2023-02-20 DIAGNOSIS — U071 COVID-19: Secondary | ICD-10-CM | POA: Insufficient documentation

## 2023-02-20 DIAGNOSIS — Z79899 Other long term (current) drug therapy: Secondary | ICD-10-CM | POA: Insufficient documentation

## 2023-02-20 DIAGNOSIS — Z7984 Long term (current) use of oral hypoglycemic drugs: Secondary | ICD-10-CM | POA: Insufficient documentation

## 2023-02-20 DIAGNOSIS — J219 Acute bronchiolitis, unspecified: Principal | ICD-10-CM | POA: Insufficient documentation

## 2023-02-20 DIAGNOSIS — A419 Sepsis, unspecified organism: Secondary | ICD-10-CM | POA: Diagnosis present

## 2023-02-20 DIAGNOSIS — R0602 Shortness of breath: Secondary | ICD-10-CM | POA: Insufficient documentation

## 2023-02-20 DIAGNOSIS — R0789 Other chest pain: Secondary | ICD-10-CM

## 2023-02-20 DIAGNOSIS — J9601 Acute respiratory failure with hypoxia: Secondary | ICD-10-CM | POA: Insufficient documentation

## 2023-02-20 DIAGNOSIS — Z7985 Long-term (current) use of injectable non-insulin antidiabetic drugs: Secondary | ICD-10-CM | POA: Insufficient documentation

## 2023-02-20 DIAGNOSIS — E785 Hyperlipidemia, unspecified: Secondary | ICD-10-CM | POA: Insufficient documentation

## 2023-02-20 DIAGNOSIS — E872 Acidosis, unspecified: Principal | ICD-10-CM | POA: Insufficient documentation

## 2023-02-20 DIAGNOSIS — R079 Chest pain, unspecified: Secondary | ICD-10-CM | POA: Diagnosis present

## 2023-02-20 DIAGNOSIS — Z7982 Long term (current) use of aspirin: Secondary | ICD-10-CM | POA: Insufficient documentation

## 2023-02-20 DIAGNOSIS — Z87891 Personal history of nicotine dependence: Secondary | ICD-10-CM | POA: Insufficient documentation

## 2023-02-20 LAB — COMPREHENSIVE METABOLIC PANEL, NON-FASTING
ALBUMIN/GLOBULIN RATIO: 1.2 (ref 0.8–1.4)
ALBUMIN: 4.1 g/dL (ref 3.5–5.7)
ALKALINE PHOSPHATASE: 83 U/L (ref 34–104)
ALT (SGPT): 17 U/L (ref 7–52)
ANION GAP: 11 mmol/L (ref 4–13)
AST (SGOT): 13 U/L (ref 13–39)
BILIRUBIN TOTAL: 0.2 mg/dL — ABNORMAL LOW (ref 0.3–1.0)
BUN/CREA RATIO: 22 (ref 6–22)
BUN: 18 mg/dL (ref 7–25)
CALCIUM, CORRECTED: 9.1 mg/dL (ref 8.9–10.8)
CALCIUM: 9.2 mg/dL (ref 8.6–10.3)
CHLORIDE: 102 mmol/L (ref 98–107)
CO2 TOTAL: 28 mmol/L (ref 21–31)
CREATININE: 0.83 mg/dL (ref 0.60–1.30)
ESTIMATED GFR: 77 mL/min/{1.73_m2} (ref >59)
GLOBULIN: 3.3 (ref 2.0–3.5)
GLUCOSE: 216 mg/dL — ABNORMAL HIGH (ref 74–109)
OSMOLALITY, CALCULATED: 290 mosm/kg (ref 270–290)
POTASSIUM: 3.9 mmol/L (ref 3.5–5.1)
PROTEIN TOTAL: 7.4 g/dL (ref 6.4–8.9)
SODIUM: 141 mmol/L (ref 136–145)

## 2023-02-20 LAB — LACTIC ACID - FIRST REFLEX
LACTIC ACID: 2.2 mmol/L (ref 0.5–2.2)
LACTIC ACID: 1.8 mmol/L (ref 0.5–2.2)
LACTIC ACID: 3.4 mmol/L — ABNORMAL HIGH (ref 0.5–2.2)

## 2023-02-20 LAB — ECG 12 LEAD
Atrial Rate: 66 {beats}/min
Calculated P Axis: 74 degrees
Calculated R Axis: 85 degrees
Calculated T Axis: 38 degrees
PR Interval: 162 ms
QRS Duration: 78 ms
QT Interval: 412 ms
QTC Calculation: 431 ms
Ventricular rate: 66 {beats}/min

## 2023-02-20 LAB — ARTERIAL BLOOD GAS/LACTATE
%FIO2 (ARTERIAL): 21 %
%FIO2 (ARTERIAL): 21 %
BASE EXCESS (ARTERIAL): 1.7 mmol/L (ref 0.0–2.0)
BASE EXCESS (ARTERIAL): 0.3 mmol/L (ref 0.0–2.0)
BICARBONATE (ARTERIAL): 25.8 mmol/L (ref 20.0–26.0)
BICARBONATE (ARTERIAL): 24.7 mmol/L (ref 20.0–26.0)
CARBOXYHEMOGLOBIN: 1.2 % (ref <=1.5)
CARBOXYHEMOGLOBIN: 1.4 % (ref <=1.5)
LACTATE: 2.6 mmol/L — ABNORMAL HIGH (ref <=2.0)
LACTATE: 3.9 mmol/L — ABNORMAL HIGH (ref <=2.0)
MET-HEMOGLOBIN: 0.4 % (ref <=2.0)
MET-HEMOGLOBIN: 0.5 % (ref <=2.0)
O2CT: 20.9 %
O2CT: 17.8 %
OXYHEMOGLOBIN: 93.1 % (ref 88.0–100.0)
OXYHEMOGLOBIN: 94.3 % (ref 88.0–100.0)
PCO2 (ARTERIAL): 38 mm[Hg] (ref 35–45)
PCO2 (ARTERIAL): 39 mm[Hg] (ref 35–45)
PH (ARTERIAL): 7.44 (ref 7.35–7.45)
PH (ARTERIAL): 7.41 (ref 7.35–7.45)
PO2 (ARTERIAL): 67 mm[Hg] — ABNORMAL LOW (ref 80–100)
PO2 (ARTERIAL): 75 mm[Hg] — ABNORMAL LOW (ref 80–100)

## 2023-02-20 LAB — CBC WITH DIFF
BASOPHIL #: 0.1 10*3/uL (ref 0.00–0.10)
BASOPHIL %: 1 % (ref 0–1)
EOSINOPHIL #: 0.1 10*3/uL (ref 0.00–0.50)
EOSINOPHIL %: 1 % (ref 1–7)
HCT: 43.2 % — ABNORMAL HIGH (ref 31.2–41.9)
HGB: 14.2 g/dL (ref 10.9–14.3)
LYMPHOCYTE #: 3.1 10*3/uL — ABNORMAL HIGH (ref 1.00–3.00)
LYMPHOCYTE %: 27 % (ref 16–44)
MCH: 28 pg (ref 24.7–32.8)
MCHC: 32.8 g/dL (ref 32.3–35.6)
MCV: 85.4 fL (ref 75.5–95.3)
MONOCYTE #: 0.8 10*3/uL (ref 0.30–1.00)
MONOCYTE %: 7 % (ref 5–13)
MPV: 8.7 fL (ref 7.9–10.8)
NEUTROPHIL #: 7.5 10*3/uL (ref 1.85–7.80)
NEUTROPHIL %: 65 % (ref 43–77)
PLATELETS: 314 10*3/uL (ref 140–440)
RBC: 5.06 10*6/uL — ABNORMAL HIGH (ref 3.63–4.92)
RDW: 14.6 % (ref 12.3–17.7)
WBC: 11.6 10*3/uL (ref 3.8–11.8)

## 2023-02-20 LAB — TROPONIN-I
TROPONIN I: 4 ng/L (ref <15)
TROPONIN I: 5 ng/L (ref <15)

## 2023-02-20 LAB — B-TYPE NATRIURETIC PEPTIDE (BNP),PLASMA: BNP: 28 pg/mL (ref 1–100)

## 2023-02-20 LAB — LACTIC ACID LEVEL W/ REFLEX FOR LEVEL >2.0: LACTIC ACID: 2.4 mmol/L — ABNORMAL HIGH (ref 0.5–2.2)

## 2023-02-20 MED ORDER — SODIUM CHLORIDE 0.9 % INTRAVENOUS PIGGYBACK
INJECTION | INTRAVENOUS | Status: AC
Start: 2023-02-20 — End: 2023-02-20
  Filled 2023-02-20: qty 100

## 2023-02-20 MED ORDER — SODIUM CHLORIDE 0.9 % (FLUSH) INJECTION SYRINGE
3.0000 mL | INJECTION | Freq: Three times a day (TID) | INTRAMUSCULAR | Status: DC
Start: 2023-02-20 — End: 2023-02-23
  Administered 2023-02-20: 0 mL
  Administered 2023-02-21: 3 mL
  Administered 2023-02-21: 0 mL
  Administered 2023-02-21 – 2023-02-22 (×3): 3 mL
  Administered 2023-02-22 – 2023-02-23 (×2): 0 mL
  Administered 2023-02-23: 3 mL

## 2023-02-20 MED ORDER — ELECTROLYTE-R BOLUS
30.0000 mL/kg | INJECTION | INTRAVENOUS | Status: AC
Start: 2023-02-20 — End: 2023-02-20
  Administered 2023-02-20: 0 mL via INTRAVENOUS
  Administered 2023-02-20: 1614 mL via INTRAVENOUS

## 2023-02-20 MED ORDER — IOHEXOL 350 MG IODINE/ML INTRAVENOUS SOLUTION
50.0000 mL | INTRAVENOUS | Status: AC
Start: 2023-02-20 — End: 2023-02-20
  Administered 2023-02-20: 100 mL via INTRAVENOUS

## 2023-02-20 MED ORDER — IPRATROPIUM 0.5 MG-ALBUTEROL 3 MG (2.5 MG BASE)/3 ML NEBULIZATION SOLN
3.0000 mL | INHALATION_SOLUTION | RESPIRATORY_TRACT | Status: AC
Start: 2023-02-20 — End: 2023-02-20
  Administered 2023-02-20: 3 mL via RESPIRATORY_TRACT

## 2023-02-20 MED ORDER — SODIUM CHLORIDE 0.9 % INTRAVENOUS PIGGYBACK
INJECTION | INTRAVENOUS | Status: AC
Start: 2023-02-20 — End: 2023-02-20
  Filled 2023-02-20: qty 50

## 2023-02-20 MED ORDER — CEFTRIAXONE 2 GRAM SOLUTION FOR INJECTION
INTRAMUSCULAR | Status: AC
Start: 2023-02-20 — End: 2023-02-20
  Filled 2023-02-20: qty 20

## 2023-02-20 MED ORDER — SODIUM CHLORIDE 0.9 % (FLUSH) INJECTION SYRINGE
3.0000 mL | INJECTION | INTRAMUSCULAR | Status: DC | PRN
Start: 2023-02-20 — End: 2023-02-23

## 2023-02-20 MED ORDER — SODIUM CHLORIDE 0.9 % INTRAVENOUS PIGGYBACK
2.0000 g | INTRAVENOUS | Status: DC
Start: 2023-02-20 — End: 2023-02-20
  Administered 2023-02-20: 0 g via INTRAVENOUS

## 2023-02-20 MED ORDER — DOXYCYCLINE HYCLATE 100 MG INTRAVENOUS POWDER FOR SOLUTION
INTRAVENOUS | Status: AC
Start: 2023-02-20 — End: 2023-02-20
  Filled 2023-02-20: qty 10

## 2023-02-20 MED ORDER — SODIUM CHLORIDE 0.9 % INTRAVENOUS PIGGYBACK
100.0000 mg | INTRAVENOUS | Status: AC
Start: 2023-02-20 — End: 2023-02-20
  Administered 2023-02-20: 0 mg via INTRAVENOUS
  Administered 2023-02-20: 100 mg via INTRAVENOUS

## 2023-02-20 NOTE — ED Provider Notes (Signed)
CHIEF COMPLAINT  Chief Complaint   Patient presents with    Shortness of Breath     HISTORY OF PRESENT ILLNESS  [name] [name], date of birth 1956/01/01, is a 68 y.o. female who presented to the Emergency Department complain of shortness a breath.  She has had has been seen by her primary care doctor with no improvement with the p.o. medications.  Worse with exertion.  She also describes some chest discomfort she says not a pain though.  Chest discomfort is not associated with the exertion.    PAST MEDICAL/SURGICAL/FAMILY/SOCIAL HISTORY  History reviewed. No pertinent past medical history.  {Medical History Negative:25850}  {Past Surgical History Negative:25851}    Family Medical History:    None       Social History     Socioeconomic History    Marital status: Married   Tobacco Use    Smoking status: Never    Smokeless tobacco: Never      ALLERGIES  Allergies   Allergen Reactions    Dilaudid [Hydromorphone]     Penicillins        PHYSICAL EXAM  VITAL SIGNS:  Filed Vitals:    02/20/23 1745 02/20/23 2145 02/20/23 2200   BP: (!) 158/66 (!) 161/62    Pulse: 78 75    Resp: 20 15    Temp: 37.1 C (98.8 F)     SpO2: 99% 99% 99%     GENERAL: PATIENT IS ALERT AND ORIENTED TO PERSON, PLACE, AND TIME.  IN NO DISTRESS  HEAD: NORMOCEPHALIC AND ATRAUMATIC.  EYES: PUPILS EQUALLY ROUND AND REACT TO LIGHT. EXTRAOCULAR MOVEMENTS INTACT.  EARS: GROSS HEARING INTACT. EXTERNAL EARS WITHIN NORMAL LIMITS.  THROAT: MOIST ORAL MUCOSA. NO ERYTHEMA OR EXUDATE OF THE PHARYNX.  NECK: SUPPLE. TRACHEA MIDLINE.  NO LYMPHADENOPATHY  CARDIOVASCULAR: REGULAR, RATE, AND RHYTHM. NO MURMUR.  LUNGS: CLEAR TO AUSCULTATION BILATERAL.  ABDOMEN: SOFT, NON-TENDER, NON-DISTENDED, AND BOWEL SOUNDS ARE PRESENT.  GENITOURINARY: DEFERRED.  RECTAL: DEFERRED.  EXTREMITIES: NO CYANOSIS, CLUBBING, OR EDEMA.  NO GROSS DEFORMITIES, MOVES ALL 4 EXTREMITIES  SKIN: WARM AND DRY.  NEUROLOGIC: CRANIAL NERVES II THROUGH XII ARE GROSSLY INTACT ALTHOUGH NOT  INDIVIDUALLY TESTED.  No gross motor deficits  PSYCHIATRIC: JUDGMENT AND INSIGHT ARE SEEMINGLY INTACT. MOOD AND AFFECT ARE APPROPRIATE FOR THE SITUATION.    PROCEDURES  ***  DIAGNOSTICS  Labs:  Labs listed below were reviewed and interpreted by me.  Results for orders placed or performed during the hospital encounter of 02/20/23   ARTERIAL BLOOD GAS/LACTATE   Result Value Ref Range    PH (ARTERIAL) 7.44 7.35 - 7.45    PCO2 (ARTERIAL) 38 35 - 45 mm/Hg    BICARBONATE (ARTERIAL) 25.8 20.0 - 26.0 mmol/L    BASE EXCESS (ARTERIAL) 1.7 0.0 - 2.0 mmol/L    MET-HEMOGLOBIN 0.4 <=2.0 %    LACTATE 2.6 (H) <=2.0 mmol/L    CARBOXYHEMOGLOBIN 1.2 <=1.5 %    O2CT 20.9 %    %FIO2 (ARTERIAL) 21 %    PO2 (ARTERIAL) 67 (L) 80 - 100 mm/Hg    OXYHEMOGLOBIN 93.1 88.0 - 100.0 %    ALLEN TEST Y     DRAW SITE RRAD    B-TYPE NATRIURETIC PEPTIDE   Result Value Ref Range    BNP 28 1 - 100 pg/mL   COMPREHENSIVE METABOLIC PANEL, NON-FASTING   Result Value Ref Range    SODIUM 141 136 - 145 mmol/L    POTASSIUM 3.9 3.5 - 5.1 mmol/L  CHLORIDE 102 98 - 107 mmol/L    CO2 TOTAL 28 21 - 31 mmol/L    ANION GAP 11 4 - 13 mmol/L    BUN 18 7 - 25 mg/dL    CREATININE 9.62 9.52 - 1.30 mg/dL    BUN/CREA RATIO 22 6 - 22    ESTIMATED GFR 77 >59 mL/min/1.53m^2    ALBUMIN 4.1 3.5 - 5.7 g/dL    CALCIUM 9.2 8.6 - 84.1 mg/dL    GLUCOSE 324 (H) 74 - 109 mg/dL    ALKALINE PHOSPHATASE 83 34 - 104 U/L    ALT (SGPT) 17 7 - 52 U/L    AST (SGOT) 13 13 - 39 U/L    BILIRUBIN TOTAL 0.2 (L) 0.3 - 1.0 mg/dL    PROTEIN TOTAL 7.4 6.4 - 8.9 g/dL    ALBUMIN/GLOBULIN RATIO 1.2 0.8 - 1.4    OSMOLALITY, CALCULATED 290 270 - 290 mOsm/kg    CALCIUM, CORRECTED 9.1 8.9 - 10.8 mg/dL    GLOBULIN 3.3 2.0 - 3.5   LACTIC ACID LEVEL W/ REFLEX FOR LEVEL >2.0   Result Value Ref Range    LACTIC ACID 2.4 (H) 0.5 - 2.2 mmol/L   CBC WITH DIFF   Result Value Ref Range    WBC 11.6 3.8 - 11.8 x10^3/uL    RBC 5.06 (H) 3.63 - 4.92 x10^6/uL    HGB 14.2 10.9 - 14.3 g/dL    HCT 40.1 (H) 02.7 - 41.9 %    MCV  85.4 75.5 - 95.3 fL    MCH 28.0 24.7 - 32.8 pg    MCHC 32.8 32.3 - 35.6 g/dL    RDW 25.3 66.4 - 40.3 %    PLATELETS 314 140 - 440 x10^3/uL    MPV 8.7 7.9 - 10.8 fL    NEUTROPHIL % 65 43 - 77 %    LYMPHOCYTE % 27 16 - 44 %    MONOCYTE % 7 5 - 13 %    EOSINOPHIL % 1 1 - 7 %    BASOPHIL % 1 0 - 1 %    NEUTROPHIL # 7.50 1.85 - 7.80 x10^3/uL    LYMPHOCYTE # 3.10 (H) 1.00 - 3.00 x10^3/uL    MONOCYTE # 0.80 0.30 - 1.00 x10^3/uL    EOSINOPHIL # 0.10 0.00 - 0.50 x10^3/uL    BASOPHIL # 0.10 0.00 - 0.10 x10^3/uL   LACTIC ACID - FIRST REFLEX   Result Value Ref Range    LACTIC ACID 2.2 0.5 - 2.2 mmol/L   LACTIC ACID - FIRST REFLEX   Result Value Ref Range    LACTIC ACID 1.8 0.5 - 2.2 mmol/L   ECG 12 LEAD   Result Value Ref Range    Ventricular rate 66 BPM    Atrial Rate 66 BPM    PR Interval 162 ms    QRS Duration 78 ms    QT Interval 412 ms    QTC Calculation 431 ms    Calculated P Axis 74 degrees    Calculated R Axis 85 degrees    Calculated T Axis 38 degrees     Radiology:  Results for orders placed or performed during the hospital encounter of 02/20/23   XR CHEST PA AND LATERAL     Status: None    Narrative    Bibi S Manganaro    RADIOLOGIST: Elisha Headland, MD    XR CHEST PA AND LATERAL performed on 02/20/2023 6:11 PM    CLINICAL HISTORY: sob.  shortness of breath; former smoker    TECHNIQUE: Frontal and lateral views of the chest.    COMPARISON:  12/05/2013    FINDINGS:    The heart size is normal.  The mediastinal contour is unremarkable.  The lungs are clear.   Degenerative changes are identified within the thoracic spine.          Impression    NO ACUTE FINDINGS.      Radiologist location ID: [id number]         ED COURSE/MEDICAL DECISION MAKING  Medications Administered in the ED   ipratropium-albuterol 0.5 mg-3 mg(2.5 mg base)/3 mL Solution for Nebulization (has no administration in time range)   NS flush syringe (0 mL Intracatheter Not Given 02/20/23 2200)   NS flush syringe (has no administration in time range)    electrolyte-R (NORMOSOL-R) bolus infusion 30 mL/kg (Ideal) = 1,614 mL (has no administration in time range)   cefTRIAXone (ROCEPHIN) 2 g in NS 50 mL IVPB minibag (has no administration in time range)   doxycycline hyclate 100 mg in NS 100 mL IVPB minibag (has no administration in time range)   iohexol (OMNIPAQUE 350) infusion (100 mL Intravenous Given 02/20/23 2215)          Medical Decision Making    CRITICAL CARE  ***  CLINICAL IMPRESSION  Clinical Impression   None     DISPOSITION  Data Unavailable       DISCHARGE MEDICATIONS  Current Discharge Medication List          //Doug Kinnie Scales M.D.   02/20/2023, 21:50   [facility]  Department of Emergency Medicine  [facility]    This note was partially generated using MModal Fluency Direct system, and there may be some incorrect words, spellings, and punctuation that were not noted in checking the note before saving.    -----

## 2023-02-20 NOTE — ED APP Handoff Note (Signed)
[facility] - Emergency Department  Emergency Department  Provider in Triage Note    Name: [name] [name]  Age: 68 y.o.  Gender: female     Subjective:   [name] [name] is a 68 y.o. female who presents with complaint of Shortness of Breath  .  Patient presents with increased shortness a breath.  Dyspnea is worse on exertion.  She has associated dry cough.  She has recently been diagnosed with both COVID and strep.  She is currently on an antibiotic for.  She does use albuterol via nebulizer and inhaler.  She does report improvement with use.  No history of congestive heart failure, chronic obstructive pulmonary disease.  She does have history of hypertension for the last 10 years well controlled.    Objective:   Filed Vitals:    02/20/23 1745   BP: (!) 158/66   Pulse: 78   Resp: 20   Temp: 37.1 C (98.8 F)   SpO2: 99%      Focused Physical Exam shows adult female upright alert appears short of breath.  TMs clear bilaterally.  No oropharyngeal erythema.  No cervical lymphadenopathy.  No sinus tenderness.  Heart regular rate and rhythm.  Lungs clear to auscultation bilaterally.    Assessment:  A medical screening exam was completed.  This patient is a 68 y.o. female with initial findings showing increased shortness breath cough.    Plan:  Please see initial orders and work-up below.  This is to be continued with full evaluation in the main Emergency Department.     No current facility-administered medications for this encounter.     Results for orders placed or performed during the hospital encounter of 02/20/23 (from the past 24 hour(s))   CBC/DIFF    Narrative    The following orders were created for panel order CBC/DIFF.  Procedure                               Abnormality         Status                     ---------                               -----------         ------                     CBC WITH IONG[[id number]]                                                                 Please  view results for these tests on the individual orders.        Senaida Lange, PA-C  02/20/2023, 17:45

## 2023-02-20 NOTE — ED Nurses Note (Signed)
Blood cultures drawn prior to antibiotic administration, per policy.

## 2023-02-20 NOTE — ED Triage Notes (Signed)
Increase sob recent covid and strept . Follow up with PCP refer to er .

## 2023-02-21 DIAGNOSIS — Z8616 Personal history of COVID-19: Secondary | ICD-10-CM

## 2023-02-21 DIAGNOSIS — K760 Fatty (change of) liver, not elsewhere classified: Secondary | ICD-10-CM

## 2023-02-21 DIAGNOSIS — Z7984 Long term (current) use of oral hypoglycemic drugs: Secondary | ICD-10-CM

## 2023-02-21 DIAGNOSIS — A419 Sepsis, unspecified organism: Secondary | ICD-10-CM

## 2023-02-21 DIAGNOSIS — I10 Essential (primary) hypertension: Secondary | ICD-10-CM

## 2023-02-21 DIAGNOSIS — E119 Type 2 diabetes mellitus without complications: Secondary | ICD-10-CM

## 2023-02-21 DIAGNOSIS — R195 Other fecal abnormalities: Secondary | ICD-10-CM

## 2023-02-21 DIAGNOSIS — J219 Acute bronchiolitis, unspecified: Principal | ICD-10-CM

## 2023-02-21 DIAGNOSIS — E785 Hyperlipidemia, unspecified: Secondary | ICD-10-CM

## 2023-02-21 LAB — EXTENDED RESPIRATORY VIRUS PANEL
ADENOVIRUS ARRAY: NOT DETECTED
BORDETELLA HOLMESII: NOT DETECTED
BORDETELLA PARAPERTUSSIS/BRONCHISEPTICA: NOT DETECTED
BORDETELLA PERTUSSIS ARRAY: NOT DETECTED
METAPNEUMOVIRUS ARRAY: NOT DETECTED
PARAINFLUENZA 1 ARRAY: NOT DETECTED
PARAINFLUENZA 2 ARRAY: NOT DETECTED
PARAINFLUENZA 3 ARRAY: NOT DETECTED
PARAINFLUENZA 4 ARRAY: NOT DETECTED
RHINOVIRUS ARRAY: NOT DETECTED

## 2023-02-21 LAB — CBC WITH DIFF
BASOPHIL #: 0 10*3/uL (ref 0.00–0.10)
BASOPHIL %: 0 % (ref 0–1)
EOSINOPHIL #: 0.1 10*3/uL (ref 0.00–0.50)
EOSINOPHIL %: 1 % (ref 1–7)
HCT: 40.9 % (ref 31.2–41.9)
HGB: 13.5 g/dL (ref 10.9–14.3)
LYMPHOCYTE #: 2 10*3/uL (ref 1.00–3.00)
LYMPHOCYTE %: 29 % (ref 16–44)
MCH: 28.2 pg (ref 24.7–32.8)
MCHC: 33.1 g/dL (ref 32.3–35.6)
MCV: 85.2 fL (ref 75.5–95.3)
MONOCYTE #: 0.6 10*3/uL (ref 0.30–1.00)
MONOCYTE %: 8 % (ref 5–13)
MPV: 8.6 fL (ref 7.9–10.8)
NEUTROPHIL #: 4.5 10*3/uL (ref 1.85–7.80)
NEUTROPHIL %: 62 % (ref 43–77)
PLATELETS: 280 10*3/uL (ref 140–440)
RBC: 4.79 10*6/uL (ref 3.63–4.92)
RDW: 14.7 % (ref 12.3–17.7)
WBC: 7.2 10*3/uL (ref 3.8–11.8)

## 2023-02-21 LAB — COMPREHENSIVE METABOLIC PANEL, NON-FASTING
ALBUMIN/GLOBULIN RATIO: 1.2 (ref 0.8–1.4)
ALBUMIN: 3.7 g/dL (ref 3.5–5.7)
ALKALINE PHOSPHATASE: 74 U/L (ref 34–104)
ALT (SGPT): 15 U/L (ref 7–52)
ANION GAP: 8 mmol/L (ref 4–13)
AST (SGOT): 13 U/L (ref 13–39)
BILIRUBIN TOTAL: 0.3 mg/dL (ref 0.3–1.0)
BUN/CREA RATIO: 23 — ABNORMAL HIGH (ref 6–22)
BUN: 14 mg/dL (ref 7–25)
CALCIUM, CORRECTED: 9.4 mg/dL (ref 8.9–10.8)
CALCIUM: 9.2 mg/dL (ref 8.6–10.3)
CHLORIDE: 103 mmol/L (ref 98–107)
CO2 TOTAL: 29 mmol/L (ref 21–31)
CREATININE: 0.62 mg/dL (ref 0.60–1.30)
ESTIMATED GFR: 98 mL/min/{1.73_m2} (ref >59)
GLOBULIN: 3 (ref 2.0–3.5)
GLUCOSE: 182 mg/dL — ABNORMAL HIGH (ref 74–109)
OSMOLALITY, CALCULATED: 285 mosm/kg (ref 270–290)
POTASSIUM: 4 mmol/L (ref 3.5–5.1)
PROTEIN TOTAL: 6.7 g/dL (ref 6.4–8.9)
SODIUM: 140 mmol/L (ref 136–145)

## 2023-02-21 LAB — URINALYSIS, MACROSCOPIC
BILIRUBIN: NEGATIVE mg/dL
BLOOD: NEGATIVE mg/dL
GLUCOSE: >1000 mg/dL — AB
KETONES: NEGATIVE mg/dL
LEUKOCYTES: NEGATIVE WBCs/uL
NITRITE: NEGATIVE
PH: 6.5 (ref 5.0–9.0)
PROTEIN: NEGATIVE mg/dL
SPECIFIC GRAVITY: 1.021 (ref 1.002–1.030)
UROBILINOGEN: NORMAL mg/dL

## 2023-02-21 LAB — GRAY TOP TUBE

## 2023-02-21 LAB — URINALYSIS, MICROSCOPIC
SQUAMOUS EPITHELIAL: <1 /[HPF] (ref <28)
WBCS: <1 /[HPF] (ref <6)

## 2023-02-21 LAB — POC BLOOD GLUCOSE (RESULTS)
GLUCOSE, POC: 148 mg/dL — ABNORMAL HIGH (ref 70–100)
GLUCOSE, POC: 169 mg/dL — ABNORMAL HIGH (ref 70–100)
GLUCOSE, POC: 204 mg/dL — ABNORMAL HIGH (ref 70–100)
GLUCOSE, POC: 192 mg/dL — ABNORMAL HIGH (ref 70–100)

## 2023-02-21 LAB — COVID-19, FLU A/B, RSV RAPID BY PCR
INFLUENZA VIRUS TYPE A: NOT DETECTED
INFLUENZA VIRUS TYPE B: NOT DETECTED
RESPIRATORY SYNCTIAL VIRUS (RSV): NOT DETECTED
SARS-CoV-2: DETECTED — AB

## 2023-02-21 LAB — MAGNESIUM
MAGNESIUM: 1.8 mg/dL — ABNORMAL LOW (ref 1.9–2.7)
MAGNESIUM: 1.7 mg/dL — ABNORMAL LOW (ref 1.9–2.7)

## 2023-02-21 LAB — TROPONIN-I: TROPONIN I: 5 ng/L (ref <15)

## 2023-02-21 LAB — GOLD TOP TUBE

## 2023-02-21 LAB — LACTIC ACID LEVEL W/ REFLEX FOR LEVEL >2.0: LACTIC ACID: 2 mmol/L (ref 0.5–2.2)

## 2023-02-21 LAB — LACTIC ACID - SECOND REFLEX: LACTIC ACID: 2.6 mmol/L — ABNORMAL HIGH (ref 0.5–2.2)

## 2023-02-21 LAB — BLUE TOP TUBE

## 2023-02-21 MED ORDER — SODIUM CHLORIDE 0.9 % INTRAVENOUS PIGGYBACK
INJECTION | INTRAVENOUS | Status: AC
Start: 2023-02-21 — End: 2023-02-21
  Filled 2023-02-21: qty 50

## 2023-02-21 MED ORDER — DOCUSATE SODIUM 100 MG CAPSULE
100.0000 mg | ORAL_CAPSULE | Freq: Two times a day (BID) | ORAL | Status: DC | PRN
Start: 2023-02-21 — End: 2023-02-23

## 2023-02-21 MED ORDER — FOLIC ACID 1 MG TABLET
ORAL_TABLET | ORAL | Status: AC
Start: 2023-02-21 — End: 2023-02-21
  Filled 2023-02-21: qty 1

## 2023-02-21 MED ORDER — SODIUM CHLORIDE 0.9 % INTRAVENOUS PIGGYBACK
2.0000 g | INTRAVENOUS | Status: DC
Start: 2023-02-21 — End: 2023-02-21
  Administered 2023-02-21: 2 g via INTRAVENOUS
  Administered 2023-02-21: 0 g via INTRAVENOUS

## 2023-02-21 MED ORDER — LISINOPRIL 20 MG TABLET
40.0000 mg | ORAL_TABLET | Freq: Every day | ORAL | Status: DC
Start: 2023-02-21 — End: 2023-02-23
  Administered 2023-02-21 – 2023-02-23 (×3): 40 mg via ORAL
  Filled 2023-02-21 (×2): qty 2

## 2023-02-21 MED ORDER — ONDANSETRON HCL (PF) 4 MG/2 ML INJECTION SOLUTION
4.0000 mg | Freq: Four times a day (QID) | INTRAMUSCULAR | Status: DC | PRN
Start: 2023-02-21 — End: 2023-02-23

## 2023-02-21 MED ORDER — INSULIN LISPRO 100 UNIT/ML SUB-Q - CHARGE BY DOSE
SUBCUTANEOUS | Status: AC
Start: 2023-02-21 — End: 2023-02-21
  Filled 2023-02-21: qty 3

## 2023-02-21 MED ORDER — GUAIFENESIN ER 600 MG TABLET, EXTENDED RELEASE 12 HR
EXTENDED_RELEASE_TABLET | ORAL | Status: AC
Start: 2023-02-21 — End: 2023-02-21
  Filled 2023-02-21: qty 1

## 2023-02-21 MED ORDER — ASPIRIN 81 MG CHEWABLE TABLET
81.0000 mg | CHEWABLE_TABLET | Freq: Every day | ORAL | Status: DC
Start: 2023-02-21 — End: 2023-02-23
  Administered 2023-02-21 – 2023-02-23 (×3): 81 mg via ORAL
  Filled 2023-02-21 (×2): qty 1

## 2023-02-21 MED ORDER — GLUCAGON 1 MG/ML SOLUTION FOR INJECTION
1.0000 mg | Freq: Once | INTRAMUSCULAR | Status: DC | PRN
Start: 2023-02-21 — End: 2023-02-23

## 2023-02-21 MED ORDER — AMLODIPINE 5 MG TABLET
10.0000 mg | ORAL_TABLET | Freq: Every day | ORAL | Status: DC
Start: 2023-02-21 — End: 2023-02-23
  Administered 2023-02-21 – 2023-02-23 (×3): 10 mg via ORAL
  Filled 2023-02-21 (×2): qty 2

## 2023-02-21 MED ORDER — ENOXAPARIN 40 MG/0.4 ML SUBCUTANEOUS SYRINGE
40.0000 mg | INJECTION | SUBCUTANEOUS | Status: DC
Start: 2023-02-21 — End: 2023-02-23
  Administered 2023-02-21 – 2023-02-23 (×3): 40 mg via SUBCUTANEOUS
  Filled 2023-02-21 (×2): qty 0.4

## 2023-02-21 MED ORDER — MAGNESIUM SULFATE 1 GRAM/100 ML IN DEXTROSE 5 % INTRAVENOUS PIGGYBACK
INJECTION | INTRAVENOUS | Status: AC
Start: 2023-02-21 — End: 2023-02-21
  Filled 2023-02-21: qty 100

## 2023-02-21 MED ORDER — DEXTROSE 50 % IN WATER (D50W) INTRAVENOUS SYRINGE
12.5000 g | INJECTION | INTRAVENOUS | Status: DC | PRN
Start: 2023-02-21 — End: 2023-02-23

## 2023-02-21 MED ORDER — MULTIVITAMIN WITH FOLIC ACID 400 MCG TABLET
ORAL_TABLET | ORAL | Status: AC
Start: 2023-02-21 — End: 2023-02-21
  Filled 2023-02-21: qty 1

## 2023-02-21 MED ORDER — CEFTRIAXONE 2 GRAM SOLUTION FOR INJECTION
INTRAMUSCULAR | Status: AC
Start: 2023-02-21 — End: 2023-02-21
  Filled 2023-02-21: qty 20

## 2023-02-21 MED ORDER — MAGNESIUM SULFATE 1 GRAM/100 ML IN DEXTROSE 5 % INTRAVENOUS PIGGYBACK
1.0000 g | INJECTION | Freq: Once | INTRAVENOUS | Status: AC
Start: 2023-02-21 — End: 2023-02-21
  Administered 2023-02-21: 0 g via INTRAVENOUS
  Administered 2023-02-21: 1 g via INTRAVENOUS

## 2023-02-21 MED ORDER — LEVOFLOXACIN 500 MG TABLET
ORAL_TABLET | ORAL | Status: AC
Start: 2023-02-21 — End: 2023-02-21
  Filled 2023-02-21: qty 1

## 2023-02-21 MED ORDER — ENOXAPARIN 40 MG/0.4 ML SUBCUTANEOUS SYRINGE
INJECTION | SUBCUTANEOUS | Status: AC
Start: 2023-02-21 — End: 2023-02-21
  Filled 2023-02-21: qty 0.4

## 2023-02-21 MED ORDER — FOLIC ACID 1 MG TABLET
1.0000 mg | ORAL_TABLET | Freq: Every day | ORAL | Status: DC
Start: 2023-02-21 — End: 2023-02-23
  Administered 2023-02-21 – 2023-02-23 (×3): 1 mg via ORAL
  Filled 2023-02-21 (×2): qty 1

## 2023-02-21 MED ORDER — INSULIN LISPRO 100 UNIT/ML SUB-Q - CHARGE BY DOSE
SUBCUTANEOUS | Status: AC
Start: 2023-02-21 — End: 2023-02-21
  Filled 2023-02-21: qty 5

## 2023-02-21 MED ORDER — ALUMINUM-MAG HYDROXIDE-SIMETHICONE 200 MG-200 MG-20 MG/5 ML ORAL SUSP
30.0000 mL | ORAL | Status: DC | PRN
Start: 2023-02-21 — End: 2023-02-23

## 2023-02-21 MED ORDER — DOXYCYCLINE HYCLATE 100 MG TABLET
100.0000 mg | ORAL_TABLET | Freq: Two times a day (BID) | ORAL | Status: DC
Start: 2023-02-21 — End: 2023-02-22
  Administered 2023-02-21 (×2): 100 mg via ORAL

## 2023-02-21 MED ORDER — HYDRALAZINE 10 MG TABLET
10.0000 mg | ORAL_TABLET | Freq: Four times a day (QID) | ORAL | Status: DC | PRN
Start: 2023-02-21 — End: 2023-02-21

## 2023-02-21 MED ORDER — ASPIRIN 81 MG CHEWABLE TABLET
CHEWABLE_TABLET | ORAL | Status: AC
Start: 2023-02-21 — End: 2023-02-21
  Filled 2023-02-21: qty 1

## 2023-02-21 MED ORDER — DOXYCYCLINE HYCLATE 100 MG TABLET
ORAL_TABLET | ORAL | Status: AC
Start: 2023-02-21 — End: 2023-02-21
  Filled 2023-02-21: qty 1

## 2023-02-21 MED ORDER — AMLODIPINE 10 MG-BENAZEPRIL 40 MG CAPSULE
1.0000 | ORAL_CAPSULE | Freq: Every day | ORAL | Status: DC
Start: 2023-02-21 — End: 2023-02-21

## 2023-02-21 MED ORDER — ALBUTEROL SULFATE 2.5 MG/3 ML (0.083 %) SOLUTION FOR NEBULIZATION
2.5000 mg | INHALATION_SOLUTION | RESPIRATORY_TRACT | Status: DC | PRN
Start: 2023-02-21 — End: 2023-02-22

## 2023-02-21 MED ORDER — AMLODIPINE 5 MG TABLET
ORAL_TABLET | ORAL | Status: AC
Start: 2023-02-21 — End: 2023-02-21
  Filled 2023-02-21: qty 2

## 2023-02-21 MED ORDER — PANTOPRAZOLE 40 MG TABLET,DELAYED RELEASE
40.0000 mg | DELAYED_RELEASE_TABLET | Freq: Every day | ORAL | Status: DC
Start: 2023-02-21 — End: 2023-02-23
  Administered 2023-02-21 – 2023-02-23 (×3): 40 mg via ORAL
  Filled 2023-02-21 (×2): qty 1

## 2023-02-21 MED ORDER — LISINOPRIL 20 MG TABLET
ORAL_TABLET | ORAL | Status: AC
Start: 2023-02-21 — End: 2023-02-21
  Filled 2023-02-21: qty 2

## 2023-02-21 MED ORDER — LEVOFLOXACIN 500 MG TABLET
750.0000 mg | ORAL_TABLET | ORAL | Status: DC
Start: 2023-02-21 — End: 2023-02-23
  Administered 2023-02-21 – 2023-02-22 (×2): 750 mg via ORAL
  Filled 2023-02-21: qty 1

## 2023-02-21 MED ORDER — GUAIFENESIN ER 600 MG TABLET, EXTENDED RELEASE 12 HR
600.0000 mg | EXTENDED_RELEASE_TABLET | Freq: Two times a day (BID) | ORAL | Status: DC
Start: 2023-02-21 — End: 2023-02-23
  Administered 2023-02-21 – 2023-02-23 (×5): 600 mg via ORAL
  Filled 2023-02-21 (×3): qty 1

## 2023-02-21 MED ORDER — INSULIN LISPRO 100 UNIT/ML SUB-Q SSIP VIAL
3.0000 [IU] | INJECTION | Freq: Four times a day (QID) | SUBCUTANEOUS | Status: DC
Start: 2023-02-21 — End: 2023-02-23
  Administered 2023-02-21: 5 [IU] via SUBCUTANEOUS
  Administered 2023-02-21: 3 [IU] via SUBCUTANEOUS
  Administered 2023-02-21 (×2): 0 [IU] via SUBCUTANEOUS
  Administered 2023-02-22: 11 [IU] via SUBCUTANEOUS
  Administered 2023-02-22: 3 [IU] via SUBCUTANEOUS
  Administered 2023-02-22: 11 [IU] via SUBCUTANEOUS
  Administered 2023-02-22: 3 [IU] via SUBCUTANEOUS
  Administered 2023-02-23: 5 [IU] via SUBCUTANEOUS
  Administered 2023-02-23: 11 [IU] via SUBCUTANEOUS
  Administered 2023-02-23: 3 [IU] via SUBCUTANEOUS
  Filled 2023-02-21: qty 5
  Filled 2023-02-21 (×3): qty 3
  Filled 2023-02-21 (×3): qty 11

## 2023-02-21 MED ORDER — PANTOPRAZOLE 40 MG TABLET,DELAYED RELEASE
DELAYED_RELEASE_TABLET | ORAL | Status: AC
Start: 2023-02-21 — End: 2023-02-21
  Filled 2023-02-21: qty 1

## 2023-02-21 MED ORDER — LEVOFLOXACIN 250 MG TABLET
ORAL_TABLET | ORAL | Status: AC
Start: 2023-02-21 — End: 2023-02-21
  Filled 2023-02-21: qty 1

## 2023-02-21 MED ORDER — DEXTROSE 40 % ORAL GEL
15.0000 g | ORAL | Status: DC | PRN
Start: 2023-02-21 — End: 2023-02-23

## 2023-02-21 MED ORDER — ACETAMINOPHEN 325 MG TABLET
ORAL_TABLET | ORAL | Status: AC
Start: 2023-02-21 — End: 2023-02-21
  Filled 2023-02-21: qty 2

## 2023-02-21 MED ORDER — ACETAMINOPHEN 325 MG TABLET
650.0000 mg | ORAL_TABLET | ORAL | Status: DC | PRN
Start: 2023-02-21 — End: 2023-02-23
  Administered 2023-02-22: 650 mg via ORAL
  Filled 2023-02-21: qty 2

## 2023-02-21 MED ORDER — MULTIVITAMIN WITH FOLIC ACID 400 MCG TABLET
1.0000 | ORAL_TABLET | Freq: Every day | ORAL | Status: DC
Start: 2023-02-21 — End: 2023-02-23
  Administered 2023-02-21 – 2023-02-23 (×3): 1 via ORAL
  Filled 2023-02-21 (×2): qty 1

## 2023-02-21 NOTE — ED Nurses Note (Signed)
Report called to Susa Raring at this time.

## 2023-02-21 NOTE — ED Nurses Note (Signed)
Report

## 2023-02-21 NOTE — Progress Notes (Signed)
[facility]               IP PROGRESS NOTE      Margary, Attalla  Date of Admission:  02/20/2023  Date of Birth:  [birth year]  Date of Service:  02/21/2023    Hospital Day:  LOS: 0 days     Subjective:   [name] [name] is a 68 y/o female who is being seen in f/u for suspected sepsis, acute hypoxic respiratory failure, bronchiolitis, DM .   Patient seen and examined in ER with hospitalist. Patient states she feels better this am.  States the O2 has helped greatly.    States she is tired of feeling bad.  Had strept pneumo last year and states it took a long time to get over it.       Vital Signs:  Temp (24hrs) Max:37.2 C (98.9 F)      Temperature: 37.2 C (98.9 F)  BP (Non-Invasive): (!) 148/60  MAP (Non-Invasive): 86 mmHG  Heart Rate: 64  Respiratory Rate: 12  SpO2: 98 %    Current Medications:  acetaminophen (TYLENOL) tablet, 650 mg, Oral, Q4H PRN  albuterol (PROVENTIL) 2.5 mg / 3 mL (0.083%) neb solution, 2.5 mg, Nebulization, Q4H PRN  aluminum-magnesium hydroxide-simethicone (MAG-AL PLUS) 200-200-20 mg per 5 mL oral liquid, 30 mL, Oral, Q4H PRN  amLODIPine (NORVASC) tablet, 10 mg, Oral, Daily   And  lisinopril (PRINIVIL) tablet, 40 mg, Oral, Daily  aspirin chewable tablet 81 mg, 81 mg, Oral, Daily  cefTRIAXone (ROCEPHIN) 2 g in NS 50 mL IVPB minibag, 2 g, Intravenous, Q24H  Correction/SSIP insulin lispro 100 units/mL injection, 3-14 Units, Subcutaneous, 4x/day AC  dextrose (GLUTOSE) 40% oral gel, 15 g, Oral, Q15 Min PRN  dextrose 50% (0.5 g/mL) injection - syringe, 12.5 g, Intravenous, Q15 Min PRN  docusate sodium (COLACE) capsule, 100 mg, Oral, 2x/day PRN  doxycycline tablet, 100 mg, Oral, 2x/day  enoxaparin PF (LOVENOX) 40 mg/0.4 mL SubQ injection, 40 mg, Subcutaneous, Q24H  folic acid (FOLVITE) tablet, 1 mg, Oral, Daily  glucagon (GLUCAGEN DIAGNOSTIC KIT) injection 1 mg, 1 mg, IntraMUSCULAR, Once PRN  guaiFENesin (MUCINEX) extended release tablet - for cough (expectorant), 600 mg, Oral,  2x/day  multivitamin (THERA) tablet, 1 Tablet, Oral, Daily  NS flush syringe, 3 mL, Intracatheter, Q8HRS  NS flush syringe, 3 mL, Intracatheter, Q1H PRN  ondansetron (ZOFRAN) 2 mg/mL injection, 4 mg, Intravenous, Q6H PRN  pantoprazole (PROTONIX) delayed release tablet, 40 mg, Oral, Daily        Current Orders:  Active Orders   Microbiology    RESPIRATORY CULTURE AND GRAM STAIN, AEROBIC - SPUTUM     Frequency: ONE TIME     Number of Occurrences: 1 Occurrences    SPUTUM SCREEN     Frequency: Once     Number of Occurrences: 1 Occurrences   Lab    BASIC METABOLIC PANEL, NON-FASTING     Frequency: 0530 - AM DRAW     Number of Occurrences: 1 Occurrences    CBC     Frequency: 0530 - AM DRAW     Number of Occurrences: 1 Occurrences    CBC/DIFF     Frequency: 0530 - AM DRAW     Number of Occurrences: 1 Occurrences    COMPREHENSIVE METABOLIC PANEL, NON-FASTING     Frequency: 0530 - AM DRAW     Number of Occurrences: 1 Occurrences    HEPATIC FUNCTION PANEL     Frequency: 0530 - AM DRAW  Number of Occurrences: 1 Occurrences    MAGNESIUM     Frequency: 0530 - AM DRAW     Number of Occurrences: 1 Occurrences    MAGNESIUM - AM ONCE     Frequency: 0530 - AM DRAW     Number of Occurrences: 1 Occurrences    PHOSPHORUS     Frequency: 0530 - AM DRAW     Number of Occurrences: 1 Occurrences    PT/INR     Frequency: 0530 - AM DRAW     Number of Occurrences: 1 Occurrences    PTT (PARTIAL THROMBOPLASTIN TIME)     Frequency: 0530 - AM DRAW     Number of Occurrences: 1 Occurrences   Diet    DIET DIABETIC Calorie amount: CC 1800; Do you want to initiate MNT Protocol? Yes     Frequency: All Meals     Number of Occurrences: 1 Occurrences   Nursing    ACTIVITY     Frequency: UNTIL DISCONTINUED     Number of Occurrences: Until Specified    APPLY SEQUENTIAL COMPRESSION DEVICE     Frequency: ONE TIME     Number of Occurrences: 1 Occurrences    CONTINUOUS CARDIAC MONITORING (ED USE ONLY)     Frequency: ONE TIME     Number of Occurrences: 1  Occurrences    HYPOGLYCEMIA MANAGEMENT - CONSCIOUS PATIENT W/DIET ORDER     Frequency: UNTIL DISCONTINUED     Number of Occurrences: Until Specified    HYPOGLYCEMIA MANAGEMENT - UNCONSCIOUS/ALTERED/NPO PATIENT     Frequency: UNTIL DISCONTINUED     Number of Occurrences: Until Specified    HYPOGLYCEMIA TREATMENT ALGORITHM     Frequency: UNTIL DISCONTINUED     Number of Occurrences: Until Specified    INTAKE AND OUTPUT Q4H     Frequency: Q4H     Number of Occurrences: Until Specified    MAINTAIN SEQUENTIAL COMPRESSION DEVICE     Frequency: CONTINUOUS     Number of Occurrences: Until Specified    MD TO NURSE:  IF YOU ANTICIPATE DIFFICULTY OR ARE UNBLE TO OBTAIN CULTURES WITHIN 30 MIN OF THIS ORDER, PLEASE CONTACT THE ORDERING PROVIDER FOR FURTHER DIRECTION.  TIMELY ANTIBIOTIC ADMINISTRATION IS IMPERATIVE TO TREATING A PATIENT WITH SEPSIS.     Frequency: UNTIL DISCONTINUED     Number of Occurrences: Until Specified    MD TO NURSE: IF UNABLE TO READILY OBTAIN ADEQUATE IV ACCESS WITHIN 15 MINUTES OF THIS ORDER, PLEASE NOTIFY PROVIDER IMMEDIATELY FOR FURTHER DIRECTION.     Frequency: UNTIL DISCONTINUED     Number of Occurrences: Until Specified    Notify MD Vital Signs     Frequency: PRN     Number of Occurrences: Until Specified    NURSE TO ENTER SECONDARY ORDER Other - (specify in comments) (CHEST PAIN AND/OR ARRYTHMIA)     Frequency: UNTIL DISCONTINUED     Number of Occurrences: Until Specified     Order Comments: Cardiac Evaluation      PT IS INTERMEDIATE RISK FOR VENOUS THROMBOEMBOLISM     Frequency: CONTINUOUS     Number of Occurrences: Until Specified    PULSE OXIMETRY CONTINUOUS     Frequency: CONTINUOUS     Number of Occurrences: Until Specified    PULSE OXIMETRY Q4H     Frequency: Q4H     Number of Occurrences: Until Specified    TELEMETRY MONITORING X 48H     Frequency: CONTINUOUS X 48 HRS     Number  of Occurrences: 48 Hours    VITAL SIGNS  Q2H     Frequency: Q2H     Number of Occurrences: 250 Occurrences     VITAL SIGNS  Q4H     Frequency: Q4H     Number of Occurrences: Until Specified    WEIGH PATIENT     Frequency: UPON ADMISSION     Number of Occurrences: 1 Occurrences    WEIGH PATIENT     Frequency: DAILY (0600)     Number of Occurrences: Until Specified    WEIGH PATIENT     Frequency: UPON ADMISSION     Number of Occurrences: 1 Occurrences   Code Status    FULL CODE: ATTEMPT RESUSCITATION / CPR     Frequency: CONTINUOUS     Number of Occurrences: Until Specified     Order Comments: Patient wishes for full ICU level care including advanced airway interventions / mechanical ventilation.     In the event of pulseless cardiac arrest, patient consents to ACLS (advanced cardiac life support) to attempt resuscitation.  IE - Consents to chest compressions, life support including intubation, mechanical ventilation, defibrillation/cardioversion as indicated.         Isolation    ENHANCED DROPLET ISOLATION     Frequency: CONTINUOUS     Number of Occurrences: Until Specified     Order Comments: Private room  Wear a fitted N95/MSA/CAPR when entering room  Wear gown, gloves, and eye protection as indicated  Prior to transport, notify receiving department of precautions  Prior to transport, ensure patient is wearing a mask and follows Respiratory Hygiene/Cough Etiquette         Respiratory Care    OXYGEN - NASAL CANNULA     Frequency: PRN     Number of Occurrences: Until Specified     Order Comments: For sats less than 88%      OXYGEN - NASAL CANNULA     Frequency: QHS PRN     Number of Occurrences: Until Specified     Order Comments: Flowrate should not exeed 6L/min except [facility] facility.          Point of Care Testing    PERFORM POC WHOLE BLOOD GLUCOSE     Frequency: TID AC & HS     Number of Occurrences: Until Specified   IV    INSERT & MAINTAIN PERIPHERAL IV ACCESS     Frequency: UNTIL DISCONTINUED     Number of Occurrences: Until Specified    PERIPHERAL IV DRESSING CHANGE     Frequency: PRN     Number of Occurrences: Until  Specified   Admission    PATIENT CLASS/LEVEL OF CARE DESIGNATION - PRN     Frequency: ONE TIME     Number of Occurrences: 1 Occurrences   Medications    acetaminophen (TYLENOL) tablet     Frequency: Q4H PRN     Dose: 650 mg     Route: Oral    albuterol (PROVENTIL) 2.5 mg / 3 mL (0.083%) neb solution     Frequency: Q4H PRN     Dose: 2.5 mg     Route: Nebulization    aluminum-magnesium hydroxide-simethicone (MAG-AL PLUS) 200-200-20 mg per 5 mL oral liquid     Frequency: Q4H PRN     Dose: 30 mL     Route: Oral    amLODIPine (NORVASC) tablet     Linked Order: And     Frequency: Daily     Dose: 10 mg  Route: Oral    aspirin chewable tablet 81 mg     Frequency: Daily     Dose: 81 mg     Route: Oral    cefTRIAXone (ROCEPHIN) 2 g in NS 50 mL IVPB minibag     Frequency: Q24H     Dose: 2 g     Route: Intravenous    Correction/SSIP insulin lispro 100 units/mL injection     Frequency: 4x/day AC     Dose: 3-14 Units     Route: Subcutaneous    dextrose (GLUTOSE) 40% oral gel     Frequency: Q15 Min PRN     Dose: 15 g     Route: Oral    dextrose 50% (0.5 g/mL) injection - syringe     Frequency: Q15 Min PRN     Dose: 12.5 g     Route: Intravenous    docusate sodium (COLACE) capsule     Frequency: 2x/day PRN     Dose: 100 mg     Route: Oral    doxycycline tablet     Frequency: 2x/day     Dose: 100 mg     Route: Oral    enoxaparin PF (LOVENOX) 40 mg/0.4 mL SubQ injection     Frequency: Q24H     Dose: 40 mg     Route: Subcutaneous    folic acid (FOLVITE) tablet     Frequency: Daily     Dose: 1 mg     Route: Oral    glucagon (GLUCAGEN DIAGNOSTIC KIT) injection 1 mg     Frequency: Once PRN     Dose: 1 mg     Route: IntraMUSCULAR    guaiFENesin (MUCINEX) extended release tablet - for cough (expectorant)     Frequency: 2x/day     Dose: 600 mg     Route: Oral    lisinopril (PRINIVIL) tablet     Linked Order: And     Frequency: Daily     Dose: 40 mg     Route: Oral    multivitamin (THERA) tablet     Frequency: Daily     Dose: 1 Tablet      Route: Oral    NS flush syringe     Frequency: Q8HRS     Dose: 3 mL     Route: Intracatheter    NS flush syringe     Frequency: Q1H PRN     Dose: 3 mL     Route: Intracatheter    ondansetron (ZOFRAN) 2 mg/mL injection     Frequency: Q6H PRN     Dose: 4 mg     Route: Intravenous    pantoprazole (PROTONIX) delayed release tablet     Frequency: Daily     Dose: 40 mg     Route: Oral        Review of Systems:  Focused review of system was completed. Refer to the HPI for ROS details.     Today's Physical Exam:  Physical Exam  Constitutional:       General: She is not in acute distress.     Appearance: She is not ill-appearing.   HENT:      Head: Normocephalic and atraumatic.   Cardiovascular:      Rate and Rhythm: Normal rate and regular rhythm.   Pulmonary:      Effort: Pulmonary effort is normal. No respiratory distress.   Abdominal:      General: Bowel sounds are normal. There is no  distension.      Palpations: Abdomen is soft.      Tenderness: There is no abdominal tenderness.   Musculoskeletal:      Right lower leg: No edema.      Left lower leg: No edema.   Skin:     General: Skin is warm.      Coloration: Skin is not jaundiced.   Neurological:      General: No focal deficit present.      Mental Status: She is alert and oriented to person, place, and time.   Psychiatric:         Mood and Affect: Mood normal.         Thought Content: Thought content normal.         Judgment: Judgment normal.        I/O:  I/O last 24 hours:    Intake/Output Summary (Last 24 hours) at 02/21/2023 1305  Last data filed at 02/21/2023 0849  Gross per 24 hour   Intake 1739 ml   Output --   Net 1739 ml     I/O current shift:  01/14 0700 - 01/14 1859  In: 25   Out: -   Labs:  Reviewed: I have reviewed all lab results.  Lab Results Today:    Results for orders placed or performed during the hospital encounter of 02/20/23 (from the past 24 hour(s))   B-TYPE NATRIURETIC PEPTIDE   Result Value Ref Range    BNP 28 1 - 100 pg/mL   COMPREHENSIVE  METABOLIC PANEL, NON-FASTING   Result Value Ref Range    SODIUM 141 136 - 145 mmol/L    POTASSIUM 3.9 3.5 - 5.1 mmol/L    CHLORIDE 102 98 - 107 mmol/L    CO2 TOTAL 28 21 - 31 mmol/L    ANION GAP 11 4 - 13 mmol/L    BUN 18 7 - 25 mg/dL    CREATININE 3.47 4.25 - 1.30 mg/dL    BUN/CREA RATIO 22 6 - 22    ESTIMATED GFR 77 >59 mL/min/1.79m^2    ALBUMIN 4.1 3.5 - 5.7 g/dL    CALCIUM 9.2 8.6 - 95.6 mg/dL    GLUCOSE 387 (H) 74 - 109 mg/dL    ALKALINE PHOSPHATASE 83 34 - 104 U/L    ALT (SGPT) 17 7 - 52 U/L    AST (SGOT) 13 13 - 39 U/L    BILIRUBIN TOTAL 0.2 (L) 0.3 - 1.0 mg/dL    PROTEIN TOTAL 7.4 6.4 - 8.9 g/dL    ALBUMIN/GLOBULIN RATIO 1.2 0.8 - 1.4    OSMOLALITY, CALCULATED 290 270 - 290 mOsm/kg    CALCIUM, CORRECTED 9.1 8.9 - 10.8 mg/dL    GLOBULIN 3.3 2.0 - 3.5   LACTIC ACID LEVEL W/ REFLEX FOR LEVEL >2.0   Result Value Ref Range    LACTIC ACID 2.4 (H) 0.5 - 2.2 mmol/L   CBC WITH DIFF   Result Value Ref Range    WBC 11.6 3.8 - 11.8 x10^3/uL    RBC 5.06 (H) 3.63 - 4.92 x10^6/uL    HGB 14.2 10.9 - 14.3 g/dL    HCT 56.4 (H) 33.2 - 41.9 %    MCV 85.4 75.5 - 95.3 fL    MCH 28.0 24.7 - 32.8 pg    MCHC 32.8 32.3 - 35.6 g/dL    RDW 95.1 88.4 - 16.6 %    PLATELETS 314 140 - 440 x10^3/uL    MPV 8.7 7.9 - 10.8  fL    NEUTROPHIL % 65 43 - 77 %    LYMPHOCYTE % 27 16 - 44 %    MONOCYTE % 7 5 - 13 %    EOSINOPHIL % 1 1 - 7 %    BASOPHIL % 1 0 - 1 %    NEUTROPHIL # 7.50 1.85 - 7.80 x10^3/uL    LYMPHOCYTE # 3.10 (H) 1.00 - 3.00 x10^3/uL    MONOCYTE # 0.80 0.30 - 1.00 x10^3/uL    EOSINOPHIL # 0.10 0.00 - 0.50 x10^3/uL    BASOPHIL # 0.10 0.00 - 0.10 x10^3/uL   TROPONIN NOW   Result Value Ref Range    TROPONIN I 4 <15 ng/L   ARTERIAL BLOOD GAS/LACTATE   Result Value Ref Range    PH (ARTERIAL) 7.44 7.35 - 7.45    PCO2 (ARTERIAL) 38 35 - 45 mm/Hg    BICARBONATE (ARTERIAL) 25.8 20.0 - 26.0 mmol/L    BASE EXCESS (ARTERIAL) 1.7 0.0 - 2.0 mmol/L    MET-HEMOGLOBIN 0.4 <=2.0 %    LACTATE 2.6 (H) <=2.0 mmol/L    CARBOXYHEMOGLOBIN 1.2 <=1.5 %    O2CT  20.9 %    %FIO2 (ARTERIAL) 21 %    PO2 (ARTERIAL) 67 (L) 80 - 100 mm/Hg    OXYHEMOGLOBIN 93.1 88.0 - 100.0 %    ALLEN TEST Y     DRAW SITE RRAD    LACTIC ACID - FIRST REFLEX   Result Value Ref Range    LACTIC ACID 2.2 0.5 - 2.2 mmol/L   ECG 12 LEAD   Result Value Ref Range    Ventricular rate 66 BPM    Atrial Rate 66 BPM    PR Interval 162 ms    QRS Duration 78 ms    QT Interval 412 ms    QTC Calculation 431 ms    Calculated P Axis 74 degrees    Calculated R Axis 85 degrees    Calculated T Axis 38 degrees   LACTIC ACID - FIRST REFLEX   Result Value Ref Range    LACTIC ACID 1.8 0.5 - 2.2 mmol/L   GOLD TOP TUBE   Result Value Ref Range    RAINBOW/EXTRA TUBE AUTO RESULT Yes    ARTERIAL BLOOD GAS/LACTATE   Result Value Ref Range    PH (ARTERIAL) 7.41 7.35 - 7.45    PCO2 (ARTERIAL) 39 35 - 45 mm/Hg    BICARBONATE (ARTERIAL) 24.7 20.0 - 26.0 mmol/L    BASE EXCESS (ARTERIAL) 0.3 0.0 - 2.0 mmol/L    MET-HEMOGLOBIN 0.5 <=2.0 %    LACTATE 3.9 (H) <=2.0 mmol/L    CARBOXYHEMOGLOBIN 1.4 <=1.5 %    O2CT 17.8 %    %FIO2 (ARTERIAL) 21 %    PO2 (ARTERIAL) 75 (L) 80 - 100 mm/Hg    OXYHEMOGLOBIN 94.3 88.0 - 100.0 %    ALLEN TEST Yes     DRAW SITE right radial    TROPONIN IN ONE HOUR   Result Value Ref Range    TROPONIN I 5 <15 ng/L   LACTIC ACID - FIRST REFLEX   Result Value Ref Range    LACTIC ACID 3.4 (H) 0.5 - 2.2 mmol/L   MAGNESIUM   Result Value Ref Range    MAGNESIUM 1.7 (L) 1.9 - 2.7 mg/dL   TROPONIN IN THREE HOURS   Result Value Ref Range    TROPONIN I 5 <15 ng/L   LACTIC ACID - SECOND REFLEX   Result Value Ref  Range    LACTIC ACID 2.6 (H) 0.5 - 2.2 mmol/L   MAGNESIUM - ONCE   Result Value Ref Range    MAGNESIUM 1.8 (L) 1.9 - 2.7 mg/dL   URINALYSIS, MACROSCOPIC   Result Value Ref Range    COLOR Yellow Colorless, Light Yellow, Yellow    APPEARANCE Clear Clear    SPECIFIC GRAVITY 1.021 1.002 - 1.030    PH 6.5 5.0 - 9.0    LEUKOCYTES Negative Negative, 100  WBCs/uL    NITRITE Negative Negative    PROTEIN Negative Negative, 10 ,  20  mg/dL    GLUCOSE >5784 (A) Negative, 30  mg/dL    KETONES Negative Negative, Trace mg/dL    BILIRUBIN Negative Negative, 0.5 mg/dL    BLOOD Negative Negative, 0.03 mg/dL    UROBILINOGEN Normal Normal mg/dL   URINALYSIS, MICROSCOPIC   Result Value Ref Range    RBCS      WBCS <1 <6 /hpf    SQUAMOUS EPITHELIAL <1 <28 /hpf   LACTIC ACID LEVEL W/ REFLEX FOR LEVEL >2.0   Result Value Ref Range    LACTIC ACID 2.0 0.5 - 2.2 mmol/L   COVID-19, FLU A/B, RSV RAPID BY PCR   Result Value Ref Range    SARS-CoV-2 Detected (A) Not Detected    INFLUENZA VIRUS TYPE A Not Detected Not Detected    INFLUENZA VIRUS TYPE B Not Detected Not Detected    RESPIRATORY SYNCTIAL VIRUS (RSV) Not Detected Not Detected   POC BLOOD GLUCOSE (RESULTS)   Result Value Ref Range    GLUCOSE, POC 148 (H) 70 - 100 mg/dl   COMPREHENSIVE METABOLIC PANEL, NON-FASTING   Result Value Ref Range    SODIUM 140 136 - 145 mmol/L    POTASSIUM 4.0 3.5 - 5.1 mmol/L    CHLORIDE 103 98 - 107 mmol/L    CO2 TOTAL 29 21 - 31 mmol/L    ANION GAP 8 4 - 13 mmol/L    BUN 14 7 - 25 mg/dL    CREATININE 6.96 2.95 - 1.30 mg/dL    BUN/CREA RATIO 23 (H) 6 - 22    ESTIMATED GFR 98 >59 mL/min/1.62m^2    ALBUMIN 3.7 3.5 - 5.7 g/dL    CALCIUM 9.2 8.6 - 28.4 mg/dL    GLUCOSE 132 (H) 74 - 109 mg/dL    ALKALINE PHOSPHATASE 74 34 - 104 U/L    ALT (SGPT) 15 7 - 52 U/L    AST (SGOT) 13 13 - 39 U/L    BILIRUBIN TOTAL 0.3 0.3 - 1.0 mg/dL    PROTEIN TOTAL 6.7 6.4 - 8.9 g/dL    ALBUMIN/GLOBULIN RATIO 1.2 0.8 - 1.4    OSMOLALITY, CALCULATED 285 270 - 290 mOsm/kg    CALCIUM, CORRECTED 9.4 8.9 - 10.8 mg/dL    GLOBULIN 3.0 2.0 - 3.5   CBC WITH DIFF   Result Value Ref Range    WBC 7.2 3.8 - 11.8 x10^3/uL    RBC 4.79 3.63 - 4.92 x10^6/uL    HGB 13.5 10.9 - 14.3 g/dL    HCT 44.0 10.2 - 72.5 %    MCV 85.2 75.5 - 95.3 fL    MCH 28.2 24.7 - 32.8 pg    MCHC 33.1 32.3 - 35.6 g/dL    RDW 36.6 44.0 - 34.7 %    PLATELETS 280 140 - 440 x10^3/uL    MPV 8.6 7.9 - 10.8 fL    NEUTROPHIL % 62 43 - 77 %  LYMPHOCYTE % 29 16 - 44 %    MONOCYTE % 8 5 - 13 %    EOSINOPHIL % 1 1 - 7 %    BASOPHIL % 0 0 - 1 %    NEUTROPHIL # 4.50 1.85 - 7.80 x10^3/uL    LYMPHOCYTE # 2.00 1.00 - 3.00 x10^3/uL    MONOCYTE # 0.60 0.30 - 1.00 x10^3/uL    EOSINOPHIL # 0.10 0.00 - 0.50 x10^3/uL    BASOPHIL # 0.00 0.00 - 0.10 x10^3/uL   BLUE TOP TUBE   Result Value Ref Range    RAINBOW/EXTRA TUBE AUTO RESULT Yes    GRAY TOP TUBE   Result Value Ref Range    RAINBOW/EXTRA TUBE AUTO RESULT Yes    RESPIRATORY VIRUS PANEL    Specimen: Nasopharyngeal Swab   Result Value Ref Range    RHINOVIRUS ARRAY Not Detected Not Detected    ADENOVIRUS ARRAY Not Detected Not Detected    METAPNEUMOVIRUS ARRAY Not Detected Not Detected    PARAINFLUENZA 1 ARRAY Not Detected Not Detected    PARAINFLUENZA 2 ARRAY Not Detected Not Detected    PARAINFLUENZA 3 ARRAY Not Detected Not Detected    PARAINFLUENZA 4 ARRAY Not Detected Not Detected    BORDETELLA PERTUSSIS ARRAY Not Detected Not Detected    BORDETELLA PARAPERTUSSIS/BRONCHISEPTICA Not Detected Not Detected, N/A    BORDETELLA HOLMESII Not Detected Not Detected   POC BLOOD GLUCOSE (RESULTS)   Result Value Ref Range    GLUCOSE, POC 169 (H) 70 - 100 mg/dl     Micro Results:   Hospital Encounter on 02/20/23 (from the past 24 hour(s))   RESPIRATORY VIRUS PANEL    Collection Time: 02/21/23 10:06 AM    Specimen: Nasopharyngeal Swab   Culture Result Status    RHINOVIRUS ARRAY Not Detected Final    ADENOVIRUS ARRAY Not Detected Final    METAPNEUMOVIRUS ARRAY Not Detected Final    PARAINFLUENZA 1 ARRAY Not Detected Final    PARAINFLUENZA 2 ARRAY Not Detected Final    PARAINFLUENZA 3 ARRAY Not Detected Final    PARAINFLUENZA 4 ARRAY Not Detected Final    BORDETELLA PERTUSSIS ARRAY Not Detected Final    BORDETELLA PARAPERTUSSIS/BRONCHISEPTICA Not Detected Final    BORDETELLA HOLMESII Not Detected Final   RESPIRATORY CULTURE AND GRAM STAIN, AEROBIC - SPUTUM    Collection Time: 02/21/23 11:43 AM    Specimen: Sputum; Other     Narrative    The following orders were created for panel order RESPIRATORY CULTURE AND GRAM STAIN, AEROBIC - SPUTUM.  Procedure                               Abnormality         Status                     ---------                               -----------         ------                     SPUTUM [id number]]  Please view results for these tests on the individual orders.     Images:   No results found.   CT ANGIO CHEST FOR PULMONARY EMBOLUS W IV CONTRAST   Final Result   GROUNDGLASS OPACITIES IN THE UPPER LOBES SUGGESTIVE OF AN INFECTIOUS BRONCHIOLITIS. NO EVIDENCE OF ACUTE PULMONARY EMBOLISM.      6 MM RIGHT UPPER LOBE PULMONARY NODULE.         One or more dose reduction techniques were used (e.g., Automated exposure control, adjustment of the mA and/or kV according to patient size, use of iterative reconstruction technique).         Radiologist location ID: [id number]         XR CHEST PA AND LATERAL   Final Result   NO ACUTE FINDINGS.         Radiologist location ID: [id number]              Problem List:  Active Hospital Problems   (*Primary Problem)    Diagnosis    *Sepsis (CMS HCC)       Nutrition:    DIET DIABETIC Calorie amount: CC 1800; Do you want to initiate MNT Protocol? Yes    Additional clinical characteristics related to nutrition:    - monitor for weight changes   - monitor intake and output    - monitor bowel functions                   Assessment/ Plan:   Suspected sepsis ruled out   Acute hypoxic respiratory failure  Bronchiolitis   -Patient was dx with COVID two weeks ago, her COVID 19 pcr is still positive   -CT of the chest showed groundglass opacities in the upper lobes suggestive of an infectious bronchiolitis, NO PE , 6mm right upper lobe pulmonary nodule  -empirically treat with IV rocephin and doxycycline  -neb treatments as needed.   -mucinex   -will get sputum culture   -will get strep and legionella antigen as well  as mycoplasma antibodies   -titrate O2 to maintain O2 sat 89-92%    DM  -SSI     HTN  -continue home medications    -further treatment adjustments will be made based off clinical course.  See orders for further details.  -Hospitalist personally evaluated and examined the patient in conjunction with the MLP and agree with the assessments, treatment plan and disposition of the patient as recorded by the [facility].     DVT/PE Prophylaxis: Enoxaparin    Ruben Reason, PA-C

## 2023-02-21 NOTE — ED Nurses Note (Signed)
Upon coming into patient's room to take BP, patient reported to this nurse that she was "tired of waiting" and that she went ahead and took her lopressor 25 that she had. Provider contacted, see new orders.

## 2023-02-21 NOTE — ED Nurses Note (Signed)
Patient reports a headache that is "throbbing" on the crown of her head. Patient reports experiencing similar headaches when her BP is elevated. Patient expressed concerns about not taking her metoprolol twice today. Patient asked if she could take her medicine that she has with her. This nurse asked the patient to not take her medicine and explained that I would contact the provider as there could be a reason she has not had it. Patient verbalized understanding.

## 2023-02-21 NOTE — H&P (Signed)
[place] MEDICINE   [facility]     HOSPITALIST H&P    [name], [name], 68 y.o. female Date of Service: 02/21/2023       Date of Birth: Oct 15, 1955  Medical Record Number: [id number]  Date of Admission:  02/20/2023  Inpatient Admission Date:    Attending: Linus Orn, DO   Service: PRN HOSPITALIST 1  PCP: Sheralyn Boatman, NP ED13/ED13  Code: No Order  LOS 0        CHIEF COMPLAINT:   Shortness of Breath    HPI:   LAURRIE [name] 68 y.o. female  with multiple medical problems including diabetes liver disease reactive airway  Patient presents on January 13th to the [facility] ER with shortness of Breath.    Patient and daughter at bedside give history.  Patient lives with her son his fiancee, and her grandson.  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  Multiple people at home have been sick over the past month or so, and 2 weeks ago the patient was diagnosed with both strep and COVID.  She noted that she was sleeping with grandson who had flu but she was negative.  She had a fever for a couple of days and then went to her primary care doctor and was given Augmentin.  Her primary care doctor saw her after Christmas, and noted that she was not getting any better and was switched to clindamycin instead.  Over the weekend she notes that she was not feeling herself thought we had, had a headache as well.  Patient denies frank diuresis but has had episodes where she feels flushed.She also notes a pressure-like soreness or ache that is in the center of her chest bilateral back that is constant in nature and does not really ever completely go away, and has felt this for the past week  and  Patient states that she has been not feeling well for quite some time, some symptoms have been chronic some symptoms have been more noticeable mostly over the past several days..  She has been having increased shortness of breath over the past week. The shortness of breath is  general at baseline and not significantly changed with exertion.  Cough nonproductive, no vomiting and diarrhea, patient has "white coat stools".  No other significant complaints or concerns.  He denies blood in urine or stool, denies dysuria.  She saw her primary care doctor who told her that not much else could be done for her and she needs to go to the ER so she came.  Patient is not normally on oxygen but is on 2 L nasal cannula here in the ER.  CT found to be negative for PE but positive for ground-glass opacities and suggestive of infectious bronchiolitis.                                                ROS: - : Other than ROS in the HPI, all other systems were negative.      HPI Information Obtained from: patient and daughter    ED MEDICATIONS:   Medications Administered in the ED   NS flush syringe (0 mL Intracatheter Not Given 02/20/23 2200)   NS flush syringe (has no administration in time range)   ipratropium-albuterol 0.5 mg-3 mg(2.5 mg base)/3 mL Solution for  Nebulization (3 mL Nebulization Given 02/20/23 2231)   electrolyte-R (NORMOSOL-R) bolus infusion 30 mL/kg (Ideal) = 1,614 mL (0 mL Intravenous Stopped 02/20/23 2256)   doxycycline hyclate 100 mg in NS 100 mL IVPB minibag (0 mg Intravenous Stopped 02/20/23 2326)   iohexol (OMNIPAQUE 350) infusion (100 mL Intravenous Given 02/20/23 2215)          MEDICAL HISTORY:     History reviewed. No pertinent past medical history.  No past surgical history on file.    Social History     Tobacco Use    Smoking status: Never    Smokeless tobacco: Never      reports that she has never smoked. She has never used smokeless tobacco.    E-Cigarettes/Vaping       Family Medical History:    None          --      ALLERGIES:    Allergies   Allergen Reactions    Dilaudid [Hydromorphone]     Penicillins          MEDICATIONS  Prior to Admission medications    Medication Sig Start Date End Date Taking? Authorizing Provider   Amlodipine-Benazepril 10-40 mg Oral Capsule Take 1 Cap by  mouth Once a day    Provider, Historical   aspirin (ECOTRIN) 81 mg Oral Tablet, Delayed Release (E.C.) Take 81 mg by mouth Once a day    Provider, Historical   canagliflozin-metformin (INVOKAMET XR) 50-1,000 mg Oral tablet, IR & ER, biphasic 24hr Take by mouth Once a day    Provider, Historical   diazePAM (VALIUM) 5 mg Oral Tablet Take 1 Tab (5 mg total) by mouth Every 6 hours as needed for Anxiety for up to 1 dose Take one pill 30 minutes prior to MRI  Take second pill at time of MRI if needed 07/06/16   Plumby, Veverly Fells, MD   Flaxseed Oral Powder Take 25 g by mouth Once a day 02/23/21   Lowella Curb, MD   loratadine (CLARITIN) 10 mg Oral Tablet Take 10 mg by mouth Once a day    Provider, Historical   omeprazole (PRILOSEC) 40 mg Oral Capsule, Delayed Release(E.C.) Take 1 Capsule (40 mg total) by mouth Once a day    Provider, Historical   SYNJARDY 12.5-1,000 mg Oral Tablet  02/09/21   Provider, Historical   TRULICITY 1.5 mg/0.5 mL Subcutaneous Pen Injector  10/30/20   Provider, Historical     Medications Prior to Admission       Prescriptions    Amlodipine-Benazepril 10-40 mg Oral Capsule    Take 1 Cap by mouth Once a day    aspirin (ECOTRIN) 81 mg Oral Tablet, Delayed Release (E.C.)    Take 81 mg by mouth Once a day    canagliflozin-metformin (INVOKAMET XR) 50-1,000 mg Oral tablet, IR & ER, biphasic 24hr    Take by mouth Once a day    diazePAM (VALIUM) 5 mg Oral Tablet    Take 1 Tab (5 mg total) by mouth Every 6 hours as needed for Anxiety for up to 1 dose Take one pill 30 minutes prior to MRI  Take second pill at time of MRI if needed    Flaxseed Oral Powder    Take 25 g by mouth Once a day    loratadine (CLARITIN) 10 mg Oral Tablet    Take 10 mg by mouth Once a day    omeprazole (PRILOSEC) 40 mg Oral Capsule, Delayed Release(E.C.)  Take 1 Capsule (40 mg total) by mouth Once a day    SYNJARDY 12.5-1,000 mg Oral Tablet    TRULICITY 1.5 mg/0.5 mL Subcutaneous Pen Injector               She will only take some of her  medications on a regular basis.          PHYSICAL EXAM:  Filed Vitals:    02/20/23 1745 02/20/23 2145 02/20/23 2200 02/21/23 0043   BP: (!) 158/66 (!) 161/62  (!) 145/68   Pulse: 78 75  71   Resp: 20 15  20    Temp: 37.1 C (98.8 F)      SpO2: 99% 99% 99% 99%           Vitals:  BP (!) 145/68   Pulse 71   Temp 37.1 C (98.8 F)   Resp 20   Ht 1.615 m (5' 3.6")   Wt 84.8 kg (187 lb)   SpO2 99%   BMI 32.50 kg/m   General:  appears chronically ill and acutely ill  Eyes:  Pupils equal and round.   HENT:  Head atraumatic and normocephalic  Neck:  supple, symmetrical, trachea midline  Lungs:  Breathing nonlabored  Cardiovascular:  regular rate and rhythm on the monitor  Abdomen:  non-distended nontender on light and deep palpation of the epigastrium and the abdominal quadrants.  Extremities:  no edema, redness or tenderness in the calves or thighs  Skin:  Skin warm and dry  Neurologic:  Grossly normal. Alert and oriented x3  Psychiatric:  Normal affect, behavior, memory, thought content, judgement, and speech.           DIAGNOSTIC STUDIES:     Recent Labs     02/20/23  1804   WBC 11.6   HGB 14.2   HCT 43.2*   PLTCNT 314     Recent Labs     02/20/23  1804   PMNS 65   LYMPHOCYTES 27   MONOCYTES 7   EOSINOPHIL 1   BASOPHILS 1  0.10   PMNABS 7.50   LYMPHSABS 3.10*   EOSABS 0.10   MONOSABS 0.80     Recent Labs     02/20/23  1804   SODIUM 141   POTASSIUM 3.9   CHLORIDE 102   CO2 28   BUN 18   CREATININE 0.83   GLUCOSENF 216*   ANIONGAP 11   BUNCRRATIO 22   GFR 77     Recent Labs     02/20/23  1804   AST 13   ALT 17   ALKPHOS 83   TOTBILIRUBIN 0.2*   TOTALPROTEIN 7.4   ALBUMIN 4.1     No results for input(s): "PREALBUMIN", "LIPASE", "UROBILINOGEN", "GAMMAGT", "LDH", "AMYLASE", "AMMONIA" in the last 72 hours.  No results for input(s): "PROTHROMTME", "INR", "APTT", "DDIMERQUANT", "FIBRINOGEN" in the last 72 hours.  No results for input(s): "CREAPROINFLA", "ESR" in the last 72 hours.        Results for orders placed or  performed during the hospital encounter of 02/20/23 (from the past 24 hour(s))   B-TYPE NATRIURETIC PEPTIDE   Result Value Ref Range    BNP 28 1 - 100 pg/mL   COMPREHENSIVE METABOLIC PANEL, NON-FASTING   Result Value Ref Range    SODIUM 141 136 - 145 mmol/L    POTASSIUM 3.9 3.5 - 5.1 mmol/L    CHLORIDE 102 98 - 107 mmol/L    CO2 TOTAL 28 21 -  31 mmol/L    ANION GAP 11 4 - 13 mmol/L    BUN 18 7 - 25 mg/dL    CREATININE 0.98 1.19 - 1.30 mg/dL    BUN/CREA RATIO 22 6 - 22    ESTIMATED GFR 77 >59 mL/min/1.54m^2    ALBUMIN 4.1 3.5 - 5.7 g/dL    CALCIUM 9.2 8.6 - 14.7 mg/dL    GLUCOSE 829 (H) 74 - 109 mg/dL    ALKALINE PHOSPHATASE 83 34 - 104 U/L    ALT (SGPT) 17 7 - 52 U/L    AST (SGOT) 13 13 - 39 U/L    BILIRUBIN TOTAL 0.2 (L) 0.3 - 1.0 mg/dL    PROTEIN TOTAL 7.4 6.4 - 8.9 g/dL    ALBUMIN/GLOBULIN RATIO 1.2 0.8 - 1.4    OSMOLALITY, CALCULATED 290 270 - 290 mOsm/kg    CALCIUM, CORRECTED 9.1 8.9 - 10.8 mg/dL    GLOBULIN 3.3 2.0 - 3.5   LACTIC ACID LEVEL W/ REFLEX FOR LEVEL >2.0   Result Value Ref Range    LACTIC ACID 2.4 (H) 0.5 - 2.2 mmol/L   CBC WITH DIFF   Result Value Ref Range    WBC 11.6 3.8 - 11.8 x10^3/uL    RBC 5.06 (H) 3.63 - 4.92 x10^6/uL    HGB 14.2 10.9 - 14.3 g/dL    HCT 56.2 (H) 13.0 - 41.9 %    MCV 85.4 75.5 - 95.3 fL    MCH 28.0 24.7 - 32.8 pg    MCHC 32.8 32.3 - 35.6 g/dL    RDW 86.5 78.4 - 69.6 %    PLATELETS 314 140 - 440 x10^3/uL    MPV 8.7 7.9 - 10.8 fL    NEUTROPHIL % 65 43 - 77 %    LYMPHOCYTE % 27 16 - 44 %    MONOCYTE % 7 5 - 13 %    EOSINOPHIL % 1 1 - 7 %    BASOPHIL % 1 0 - 1 %    NEUTROPHIL # 7.50 1.85 - 7.80 x10^3/uL    LYMPHOCYTE # 3.10 (H) 1.00 - 3.00 x10^3/uL    MONOCYTE # 0.80 0.30 - 1.00 x10^3/uL    EOSINOPHIL # 0.10 0.00 - 0.50 x10^3/uL    BASOPHIL # 0.10 0.00 - 0.10 x10^3/uL   TROPONIN NOW   Result Value Ref Range    TROPONIN I 4 <15 ng/L   ARTERIAL BLOOD GAS/LACTATE   Result Value Ref Range    PH (ARTERIAL) 7.44 7.35 - 7.45    PCO2 (ARTERIAL) 38 35 - 45 mm/Hg    BICARBONATE (ARTERIAL)  25.8 20.0 - 26.0 mmol/L    BASE EXCESS (ARTERIAL) 1.7 0.0 - 2.0 mmol/L    MET-HEMOGLOBIN 0.4 <=2.0 %    LACTATE 2.6 (H) <=2.0 mmol/L    CARBOXYHEMOGLOBIN 1.2 <=1.5 %    O2CT 20.9 %    %FIO2 (ARTERIAL) 21 %    PO2 (ARTERIAL) 67 (L) 80 - 100 mm/Hg    OXYHEMOGLOBIN 93.1 88.0 - 100.0 %    ALLEN TEST Y     DRAW SITE RRAD    LACTIC ACID - FIRST REFLEX   Result Value Ref Range    LACTIC ACID 2.2 0.5 - 2.2 mmol/L   ECG 12 LEAD   Result Value Ref Range    Ventricular rate 66 BPM    Atrial Rate 66 BPM    PR Interval 162 ms    QRS Duration 78 ms    QT Interval  412 ms    QTC Calculation 431 ms    Calculated P Axis 74 degrees    Calculated R Axis 85 degrees    Calculated T Axis 38 degrees   LACTIC ACID - FIRST REFLEX   Result Value Ref Range    LACTIC ACID 1.8 0.5 - 2.2 mmol/L   ARTERIAL BLOOD GAS/LACTATE   Result Value Ref Range    PH (ARTERIAL) 7.41 7.35 - 7.45    PCO2 (ARTERIAL) 39 35 - 45 mm/Hg    BICARBONATE (ARTERIAL) 24.7 20.0 - 26.0 mmol/L    BASE EXCESS (ARTERIAL) 0.3 0.0 - 2.0 mmol/L    MET-HEMOGLOBIN 0.5 <=2.0 %    LACTATE 3.9 (H) <=2.0 mmol/L    CARBOXYHEMOGLOBIN 1.4 <=1.5 %    O2CT 17.8 %    %FIO2 (ARTERIAL) 21 %    PO2 (ARTERIAL) 75 (L) 80 - 100 mm/Hg    OXYHEMOGLOBIN 94.3 88.0 - 100.0 %    ALLEN TEST Yes     DRAW SITE right radial    TROPONIN IN ONE HOUR   Result Value Ref Range    TROPONIN I 5 <15 ng/L   LACTIC ACID - FIRST REFLEX   Result Value Ref Range    LACTIC ACID 3.4 (H) 0.5 - 2.2 mmol/L                  ECG Interpretation:  Bradycardia on telemetry temporarily to the 49 stain for prolonged period of time in the 50s.          Imaging Studies:    Results for orders placed or performed during the hospital encounter of 02/20/23   XR CHEST PA AND LATERAL     Status: None    Narrative    Laurabeth S Ferrera    RADIOLOGIST: Elisha Headland, MD    XR CHEST PA AND LATERAL performed on 02/20/2023 6:11 PM    CLINICAL HISTORY: sob.  shortness of breath; former smoker    TECHNIQUE: Frontal and lateral views of the  chest.    COMPARISON:  12/05/2013    FINDINGS:    The heart size is normal.  The mediastinal contour is unremarkable.  The lungs are clear.   Degenerative changes are identified within the thoracic spine.          Impression    NO ACUTE FINDINGS.      Radiologist location ID: [id number]     CT ANGIO CHEST FOR PULMONARY EMBOLUS W IV CONTRAST     Status: None    Narrative    Mikea S Haskett    RADIOLOGIST: Truman Hayward, MD    CT ANGIO CHEST FOR PULMONARY EMBOLUS W IV CONTRAST performed on 02/20/2023 10:14 PM    CLINICAL HISTORY: Dyspnea on exertion.  Dyspnea on exertion, recent COVID    TECHNIQUE: CTA imaging of the chest with intravenous contrast.  3D reconstructions.  CONTRAST:  100 ml's of Omnipaque 350    COMPARISON: None.         FINDINGS:  Hardware:  None.    Lymph nodes:   No mediastinal, hilar, or axillary lymphadenopathy.    Heart:  Normal heart size.  No pericardial effusion.        RV/LV Diameter Ratio: N/A    Thoracic Aorta:  No thoracic aortic aneurysm or dissection.    Pulmonary Vessels:  No evidence of acute pulmonary emboli through the major subsegmental branches.     Most Proximal Level Of Embolus (if embolus  present): N/A    Lungs and Airways:  There are scattered groundglass opacities in the upper lobes most notably in the left upper lobe on image 45. This favors an infectious bronchiolitis.    6 mm right upper lobe nodule seen on image 76.    Pleura: No pleural effusion.  No pneumothorax.    Upper Abdomen: Visualized portions of the upper abdominal viscera are unremarkable. There is fatty infiltration of the liver.    Bones: Degenerative changes of the thoracic spine.        Impression    GROUNDGLASS OPACITIES IN THE UPPER LOBES SUGGESTIVE OF AN INFECTIOUS BRONCHIOLITIS. NO EVIDENCE OF ACUTE PULMONARY EMBOLISM.    6 MM RIGHT UPPER LOBE PULMONARY NODULE.      One or more dose reduction techniques were used (e.g., Automated exposure control, adjustment of the mA and/or kV according to  patient size, use of iterative reconstruction technique).      Radiologist location ID: [id number]                      ASSESSMENT AND PLAN:  Mildreth, Schenck, 68 y.o. female      IPRX  No outpatient medications have been marked as taking for the 02/20/23 encounter [facility] Encounter).      NS flush syringe, 3 mL, Intracatheter, Q8HRS  NS flush syringe, 3 mL, Intracatheter, Q1H PRN           Problem List  Active Hospital Problems   (*Primary Problem)    Diagnosis    *Sepsis (CMS HCC)     ---------------------      ---------------  Patient Active Problem List   Diagnosis    Left-sided chest wall pain    Breast pain, left    Breast pain, right    Family history of breast cancer    Sepsis (CMS HCC)            AP  Sepsis - patient has signs of sepsis, including lactic acidosis as house the plan for.  Unclear etiology, consideration given to sepsis from pulmonary infection, versus from liver source, versus pharmacologic source.  Patient given fluids in the ER, and this improved.  Continue to monitor for signs of sepsis, but patient may be improving and doing better, enough to go own sometime tomorrow.   Bronchiolitis - patient has what appears to be infectious bronchiolitis on CT imaging, this is likely related to her recent COVID 19 diagnosis.  We will place patient on nebulizer treatments and continue antibiotic regimen, to complete on the 19th.  If patient does not show enough improvement may consider also place the patient on steroids or inhaled glucocorticoids.  Does have a history of reactive airway disease and allergies for which she has taken Claritin in the past, currently takes Zyrtec intermittently.  The bronchiolitis is likely the source of her aching chest pain, ACLS less likely, and troponins are negative  Diabetes - hold some of patient's home oral medications, place patient on sliding scale.  Hypertension -  monitor patient and continue her on her home medication  Hyperlipidemia - patient had reaction  to previous statins does not take them currently.  Has fatty liver disease.  Troponins are negative in the ER.  Abnormal stool - patient complain of pale colored stools over the past 6 months intermittently, but has shown up again in the past few weeks.  Suggested outpatient follow-up, may do so stool testing while patient is here if stool produced  is ill colored, we will check amylase lipase also             DIET: No diet orders on file diabetic diet  DVT PROPHYLAXIS:  Lovenox  CODE/DNR STATUS:  Full code  No Order         Disposition:    placed in observation estimated stay less or more than 48 hours based on this to obtain full medical treatment.   The patient is currently acutely ill requiring treatment on the medical floor for sepsis shortness of Breath.   Patient will be closely evaluated monitor and remove be adjusted accordingly.          Phineas Inches, MD     [place] MEDICINE HOSPITALIST     [facility]     Contents of the document, in whole or in part, are completed utilizing voice dictation technology, please forgive any typographical errors that may exist.

## 2023-02-22 DIAGNOSIS — R911 Solitary pulmonary nodule: Secondary | ICD-10-CM

## 2023-02-22 DIAGNOSIS — U071 COVID-19: Secondary | ICD-10-CM

## 2023-02-22 DIAGNOSIS — I499 Cardiac arrhythmia, unspecified: Secondary | ICD-10-CM

## 2023-02-22 DIAGNOSIS — J9601 Acute respiratory failure with hypoxia: Secondary | ICD-10-CM

## 2023-02-22 DIAGNOSIS — R001 Bradycardia, unspecified: Secondary | ICD-10-CM

## 2023-02-22 LAB — CBC WITH DIFF
BASOPHIL #: 0 10*3/uL (ref 0.00–0.10)
BASOPHIL %: 0 % (ref 0–1)
EOSINOPHIL #: 0.1 10*3/uL (ref 0.00–0.50)
EOSINOPHIL %: 2 % (ref 1–7)
HCT: 40.5 % (ref 31.2–41.9)
HGB: 13.4 g/dL (ref 10.9–14.3)
LYMPHOCYTE #: 2.5 10*3/uL (ref 1.00–3.00)
LYMPHOCYTE %: 29 % (ref 16–44)
MCH: 28.3 pg (ref 24.7–32.8)
MCHC: 33.1 g/dL (ref 32.3–35.6)
MCV: 85.4 fL (ref 75.5–95.3)
MONOCYTE #: 0.7 10*3/uL (ref 0.30–1.00)
MONOCYTE %: 9 % (ref 5–13)
MPV: 8.6 fL (ref 7.9–10.8)
NEUTROPHIL #: 5.2 10*3/uL (ref 1.85–7.80)
NEUTROPHIL %: 60 % (ref 43–77)
PLATELETS: 273 10*3/uL (ref 140–440)
RBC: 4.74 10*6/uL (ref 3.63–4.92)
RDW: 15 % (ref 12.3–17.7)
WBC: 8.5 10*3/uL (ref 3.8–11.8)

## 2023-02-22 LAB — ECG 12 LEAD
Atrial Rate: 58 {beats}/min
Calculated P Axis: 71 degrees
Calculated R Axis: 89 degrees
Calculated T Axis: 22 degrees
PR Interval: 158 ms
QRS Duration: 72 ms
QT Interval: 428 ms
QTC Calculation: 420 ms
Ventricular rate: 58 {beats}/min

## 2023-02-22 LAB — MAGNESIUM: MAGNESIUM: 2 mg/dL (ref 1.9–2.7)

## 2023-02-22 LAB — COMPREHENSIVE METABOLIC PANEL, NON-FASTING
ALBUMIN/GLOBULIN RATIO: 1.3 (ref 0.8–1.4)
ALBUMIN: 3.8 g/dL (ref 3.5–5.7)
ALKALINE PHOSPHATASE: 80 U/L (ref 34–104)
ALT (SGPT): 16 U/L (ref 7–52)
ANION GAP: 10 mmol/L (ref 4–13)
AST (SGOT): 13 U/L (ref 13–39)
BILIRUBIN TOTAL: 0.2 mg/dL — ABNORMAL LOW (ref 0.3–1.0)
BUN/CREA RATIO: 29 — ABNORMAL HIGH (ref 6–22)
BUN: 19 mg/dL (ref 7–25)
CALCIUM, CORRECTED: 9.4 mg/dL (ref 8.9–10.8)
CALCIUM: 9.2 mg/dL (ref 8.6–10.3)
CHLORIDE: 103 mmol/L (ref 98–107)
CO2 TOTAL: 24 mmol/L (ref 21–31)
CREATININE: 0.66 mg/dL (ref 0.60–1.30)
ESTIMATED GFR: 96 mL/min/{1.73_m2} (ref >59)
GLOBULIN: 3 (ref 2.0–3.5)
GLUCOSE: 275 mg/dL — ABNORMAL HIGH (ref 74–109)
OSMOLALITY, CALCULATED: 286 mosm/kg (ref 270–290)
POTASSIUM: 4.2 mmol/L (ref 3.5–5.1)
PROTEIN TOTAL: 6.8 g/dL (ref 6.4–8.9)
SODIUM: 137 mmol/L (ref 136–145)

## 2023-02-22 LAB — LEGIONELLA URINE ANTIGEN: LEGIONELLA ANTIGEN: NEGATIVE

## 2023-02-22 LAB — BILIRUBIN, CONJUGATED (DIRECT): BILIRUBIN DIRECT: 0.1 md/dL (ref 0.03–0.18)

## 2023-02-22 LAB — POC BLOOD GLUCOSE (RESULTS)
GLUCOSE, POC: 176 mg/dL — ABNORMAL HIGH (ref 70–100)
GLUCOSE, POC: 303 mg/dL — ABNORMAL HIGH (ref 70–100)
GLUCOSE, POC: 197 mg/dL — ABNORMAL HIGH (ref 70–100)
GLUCOSE, POC: 322 mg/dL — ABNORMAL HIGH (ref 70–100)

## 2023-02-22 LAB — PT/INR
INR: 0.97 (ref 0.84–1.10)
PROTHROMBIN TIME: 11 s (ref 9.8–12.7)

## 2023-02-22 LAB — STREPTOCOCCUS PNEUMONIAE ANTIGEN,URINE: S.PNEUMONIA ANTIGEN: NEGATIVE

## 2023-02-22 LAB — PTT (PARTIAL THROMBOPLASTIN TIME): APTT: 32.5 s (ref 25.0–38.0)

## 2023-02-22 LAB — PHOSPHORUS: PHOSPHORUS: 4 mg/dL (ref 3.7–7.2)

## 2023-02-22 MED ORDER — ALBUTEROL SULFATE 2.5 MG/3 ML (0.083 %) SOLUTION FOR NEBULIZATION
2.5000 mg | INHALATION_SOLUTION | Freq: Four times a day (QID) | RESPIRATORY_TRACT | Status: DC
Start: 2023-02-22 — End: 2023-02-23
  Administered 2023-02-22 – 2023-02-23 (×6): 2.5 mg via RESPIRATORY_TRACT

## 2023-02-22 MED ORDER — CYCLOBENZAPRINE 10 MG TABLET
10.0000 mg | ORAL_TABLET | Freq: Three times a day (TID) | ORAL | Status: DC | PRN
Start: 2023-02-22 — End: 2023-02-23
  Administered 2023-02-22: 10 mg via ORAL
  Filled 2023-02-22: qty 1

## 2023-02-22 MED ORDER — METOPROLOL TARTRATE 25 MG TABLET
25.0000 mg | ORAL_TABLET | Freq: Two times a day (BID) | ORAL | Status: DC
Start: 2023-02-22 — End: 2023-02-23

## 2023-02-22 MED ORDER — FLUTICASONE PROPIONATE 50 MCG/ACTUATION NASAL SPRAY,SUSPENSION
2.0000 | Freq: Every day | NASAL | Status: DC
Start: 2023-02-22 — End: 2023-02-23
  Administered 2023-02-22 – 2023-02-23 (×2): 2 via NASAL
  Filled 2023-02-22: qty 32

## 2023-02-22 MED ORDER — LORATADINE 10 MG TABLET
10.0000 mg | ORAL_TABLET | Freq: Every day | ORAL | Status: DC | PRN
Start: 2023-02-22 — End: 2023-02-23

## 2023-02-22 MED ORDER — BUDESONIDE-FORMOTEROL HFA 160 MCG-4.5 MCG/ACTUATION AEROSOL INHALER
2.0000 | INHALATION_SPRAY | Freq: Two times a day (BID) | RESPIRATORY_TRACT | Status: DC
Start: 2023-02-22 — End: 2023-02-23
  Administered 2023-02-22 – 2023-02-23 (×2): 2 via RESPIRATORY_TRACT

## 2023-02-22 NOTE — Progress Notes (Signed)
[facility]               IP PROGRESS NOTE      [name] [name], [name] [name]  Date of Admission:  02/20/2023  Date of Birth:  10-03-1955  Date of Service:  02/22/2023    Hospital Day:  LOS: 0 days     Subjective:   [name] [name] is a 68 y/o female who is being seen in f/u for suspected sepsis, acute hypoxic respiratory failure, bronchiolitis, DM .   Patient seen and examined in ER with hospitalist. Patient states she feels better this am.  States the O2 has helped greatly.    States she is tired of feeling bad.  Had strept pneumo last year and states it took a long time to get over it.     02/22/2023 Patient seen and examined at bedside with hosptialist.  Patient is currently saturating mid 90's on RA.  States she still feels winded when she moves around.   No incentive spirometry in room.   No other complaints voiced.       Vital Signs:  Temp (24hrs) Max:36.8 C (98.3 F)      Temperature: 36.7 C (98.1 F)  BP (Non-Invasive): 137/68  MAP (Non-Invasive): 89 mmHG  Heart Rate: 79  Respiratory Rate: 18  SpO2: 97 %    Current Medications:  acetaminophen (TYLENOL) tablet, 650 mg, Oral, Q4H PRN  albuterol (PROVENTIL) 2.5 mg / 3 mL (0.083%) neb solution, 2.5 mg, Nebulization, 4x/day  aluminum-magnesium hydroxide-simethicone (MAG-AL PLUS) 200-200-20 mg per 5 mL oral liquid, 30 mL, Oral, Q4H PRN  amLODIPine (NORVASC) tablet, 10 mg, Oral, Daily   And  lisinopril (PRINIVIL) tablet, 40 mg, Oral, Daily  aspirin chewable tablet 81 mg, 81 mg, Oral, Daily  budesonide-formoterol (SYMBICORT) 160 mcg-4.5 mcg per inhalation oral inhaler - "Respiratory to administer", 2 Puff, Inhalation, 2X/day  Correction/SSIP insulin lispro 100 units/mL injection, 3-14 Units, Subcutaneous, 4x/day AC  cyclobenzaprine (FLEXERIL) tablet, 10 mg, Oral, 3x/day PRN  dextrose (GLUTOSE) 40% oral gel, 15 g, Oral, Q15 Min PRN  dextrose 50% (0.5 g/mL) injection - syringe, 12.5 g, Intravenous, Q15 Min PRN  docusate sodium (COLACE) capsule, 100 mg, Oral,  2x/day PRN  enoxaparin PF (LOVENOX) 40 mg/0.4 mL SubQ injection, 40 mg, Subcutaneous, Q24H  fluticasone (FLONASE) 50 mcg per spray nasal spray, 2 Spray, Each Nostril, Daily  folic acid (FOLVITE) tablet, 1 mg, Oral, Daily  glucagon (GLUCAGEN DIAGNOSTIC KIT) injection 1 mg, 1 mg, IntraMUSCULAR, Once PRN  guaiFENesin (MUCINEX) extended release tablet - for cough (expectorant), 600 mg, Oral, 2x/day  levoFLOXacin (LEVAQUIN) tablet 750 mg, 750 mg, Oral, Q24H  loratadine (CLARITIN) tablet, 10 mg, Oral, Daily PRN  [Held by provider] metoprolol tartrate (LOPRESSOR) tablet, 25 mg, Oral, 2x/day  multivitamin (THERA) tablet, 1 Tablet, Oral, Daily  NS flush syringe, 3 mL, Intracatheter, Q8HRS  NS flush syringe, 3 mL, Intracatheter, Q1H PRN  ondansetron (ZOFRAN) 2 mg/mL injection, 4 mg, Intravenous, Q6H PRN  pantoprazole (PROTONIX) delayed release tablet, 40 mg, Oral, Daily        Current Orders:  Active Orders   Microbiology    RESPIRATORY CULTURE AND GRAM STAIN, AEROBIC - SPUTUM     Frequency: ONE TIME     Number of Occurrences: 1 Occurrences    SPUTUM SCREEN     Frequency: Once     Number of Occurrences: 1 Occurrences   Diet    DIET DIABETIC Calorie amount: CC 1800; Do you want to initiate MNT Protocol?  Yes     Frequency: All Meals     Number of Occurrences: 1 Occurrences   Nursing    ACTIVITY     Frequency: UNTIL DISCONTINUED     Number of Occurrences: Until Specified    APPLY SEQUENTIAL COMPRESSION DEVICE     Frequency: ONE TIME     Number of Occurrences: 1 Occurrences    CONTINUOUS CARDIAC MONITORING (ED USE ONLY)     Frequency: ONE TIME     Number of Occurrences: 1 Occurrences    HYPOGLYCEMIA MANAGEMENT - CONSCIOUS PATIENT W/DIET ORDER     Frequency: UNTIL DISCONTINUED     Number of Occurrences: Until Specified    HYPOGLYCEMIA MANAGEMENT - UNCONSCIOUS/ALTERED/NPO PATIENT     Frequency: UNTIL DISCONTINUED     Number of Occurrences: Until Specified    HYPOGLYCEMIA TREATMENT ALGORITHM     Frequency: UNTIL DISCONTINUED      Number of Occurrences: Until Specified    INCENTIVE SPIROMETRY NURSING     Frequency: Q1H WA     Number of Occurrences: 250 Occurrences    INTAKE AND OUTPUT Q4H     Frequency: Q4H     Number of Occurrences: Until Specified    MAINTAIN SEQUENTIAL COMPRESSION DEVICE     Frequency: CONTINUOUS     Number of Occurrences: Until Specified    MD TO NURSE:  IF YOU ANTICIPATE DIFFICULTY OR ARE UNBLE TO OBTAIN CULTURES WITHIN 30 MIN OF THIS ORDER, PLEASE CONTACT THE ORDERING PROVIDER FOR FURTHER DIRECTION.  TIMELY ANTIBIOTIC ADMINISTRATION IS IMPERATIVE TO TREATING A PATIENT WITH SEPSIS.     Frequency: UNTIL DISCONTINUED     Number of Occurrences: Until Specified    MD TO NURSE: IF UNABLE TO READILY OBTAIN ADEQUATE IV ACCESS WITHIN 15 MINUTES OF THIS ORDER, PLEASE NOTIFY PROVIDER IMMEDIATELY FOR FURTHER DIRECTION.     Frequency: UNTIL DISCONTINUED     Number of Occurrences: Until Specified    Notify MD Vital Signs     Frequency: PRN     Number of Occurrences: Until Specified    NURSE TO ENTER SECONDARY ORDER Other - (specify in comments) (CHEST PAIN AND/OR ARRYTHMIA)     Frequency: UNTIL DISCONTINUED     Number of Occurrences: Until Specified     Order Comments: Cardiac Evaluation      PT IS INTERMEDIATE RISK FOR VENOUS THROMBOEMBOLISM     Frequency: CONTINUOUS     Number of Occurrences: Until Specified    PULSE OXIMETRY CONTINUOUS     Frequency: CONTINUOUS     Number of Occurrences: Until Specified    TELEMETRY MONITORING X 48H     Frequency: CONTINUOUS X 48 HRS     Number of Occurrences: 48 Hours    VITAL SIGNS  Q4H     Frequency: Q4H     Number of Occurrences: Until Specified    WEIGH PATIENT     Frequency: UPON ADMISSION     Number of Occurrences: 1 Occurrences    WEIGH PATIENT     Frequency: DAILY (0600)     Number of Occurrences: Until Specified    WEIGH PATIENT     Frequency: UPON ADMISSION     Number of Occurrences: 1 Occurrences   Code Status    FULL CODE: ATTEMPT RESUSCITATION / CPR     Frequency: CONTINUOUS      Number of Occurrences: Until Specified     Order Comments: Patient wishes for full ICU level care including advanced airway interventions / mechanical ventilation.  In the event of pulseless cardiac arrest, patient consents to ACLS (advanced cardiac life support) to attempt resuscitation.  IE - Consents to chest compressions, life support including intubation, mechanical ventilation, defibrillation/cardioversion as indicated.         Isolation    ENHANCED DROPLET ISOLATION     Frequency: CONTINUOUS     Number of Occurrences: Until Specified     Order Comments: Private room  Wear a fitted N95/MSA/CAPR when entering room  Wear gown, gloves, and eye protection as indicated  Prior to transport, notify receiving department of precautions  Prior to transport, ensure patient is wearing a mask and follows Respiratory Hygiene/Cough Etiquette         Respiratory Care    OXYGEN - NASAL CANNULA     Frequency: PRN     Number of Occurrences: Until Specified     Order Comments: For sats less than 88%      OXYGEN - NASAL CANNULA     Frequency: QHS PRN     Number of Occurrences: Until Specified     Order Comments: Flowrate should not exeed 6L/min except [facility] facility.          Point of Care Testing    PERFORM POC WHOLE BLOOD GLUCOSE     Frequency: TID AC & HS     Number of Occurrences: Until Specified   IV    INSERT & MAINTAIN PERIPHERAL IV ACCESS     Frequency: UNTIL DISCONTINUED     Number of Occurrences: Until Specified    PERIPHERAL IV DRESSING CHANGE     Frequency: PRN     Number of Occurrences: Until Specified   Medications    acetaminophen (TYLENOL) tablet     Frequency: Q4H PRN     Dose: 650 mg     Route: Oral    albuterol (PROVENTIL) 2.5 mg / 3 mL (0.083%) neb solution     Frequency: 4x/day     Dose: 2.5 mg     Route: Nebulization    aluminum-magnesium hydroxide-simethicone (MAG-AL PLUS) 200-200-20 mg per 5 mL oral liquid     Frequency: Q4H PRN     Dose: 30 mL     Route: Oral    amLODIPine (NORVASC) tablet     Linked  Order: And     Frequency: Daily     Dose: 10 mg     Route: Oral    aspirin chewable tablet 81 mg     Frequency: Daily     Dose: 81 mg     Route: Oral    budesonide-formoterol (SYMBICORT) 160 mcg-4.5 mcg per inhalation oral inhaler - "Respiratory to administer"     Frequency: 2X/day     Dose: 2 Puff     Route: Inhalation    Correction/SSIP insulin lispro 100 units/mL injection     Frequency: 4x/day AC     Dose: 3-14 Units     Route: Subcutaneous    cyclobenzaprine (FLEXERIL) tablet     Frequency: 3x/day PRN     Dose: 10 mg     Route: Oral    dextrose (GLUTOSE) 40% oral gel     Frequency: Q15 Min PRN     Dose: 15 g     Route: Oral    dextrose 50% (0.5 g/mL) injection - syringe     Frequency: Q15 Min PRN     Dose: 12.5 g     Route: Intravenous    docusate sodium (COLACE) capsule  Frequency: 2x/day PRN     Dose: 100 mg     Route: Oral    enoxaparin PF (LOVENOX) 40 mg/0.4 mL SubQ injection     Frequency: Q24H     Dose: 40 mg     Route: Subcutaneous    fluticasone (FLONASE) 50 mcg per spray nasal spray     Frequency: Daily     Dose: 2 Spray     Route: Each Nostril    folic acid (FOLVITE) tablet     Frequency: Daily     Dose: 1 mg     Route: Oral    glucagon (GLUCAGEN DIAGNOSTIC KIT) injection 1 mg     Frequency: Once PRN     Dose: 1 mg     Route: IntraMUSCULAR    guaiFENesin (MUCINEX) extended release tablet - for cough (expectorant)     Frequency: 2x/day     Dose: 600 mg     Route: Oral    levoFLOXacin (LEVAQUIN) tablet 750 mg     Frequency: Q24H     Dose: 750 mg     Route: Oral    lisinopril (PRINIVIL) tablet     Linked Order: And     Frequency: Daily     Dose: 40 mg     Route: Oral    loratadine (CLARITIN) tablet     Frequency: Daily PRN     Dose: 10 mg     Route: Oral    metoprolol tartrate (LOPRESSOR) tablet     Frequency: 2x/day     Dose: 25 mg     Route: Oral    multivitamin (THERA) tablet     Frequency: Daily     Dose: 1 Tablet     Route: Oral    NS flush syringe     Frequency: Q8HRS     Dose: 3 mL     Route:  Intracatheter    NS flush syringe     Frequency: Q1H PRN     Dose: 3 mL     Route: Intracatheter    ondansetron (ZOFRAN) 2 mg/mL injection     Frequency: Q6H PRN     Dose: 4 mg     Route: Intravenous    pantoprazole (PROTONIX) delayed release tablet     Frequency: Daily     Dose: 40 mg     Route: Oral        Review of Systems:  Focused review of system was completed. Refer to the HPI for ROS details.     Today's Physical Exam:  Physical Exam  Constitutional:       General: She is not in acute distress.     Appearance: She is not ill-appearing.   HENT:      Head: Normocephalic and atraumatic.   Cardiovascular:      Rate and Rhythm: Normal rate and regular rhythm.   Pulmonary:      Effort: Pulmonary effort is normal. No respiratory distress.   Abdominal:      General: Bowel sounds are normal. There is no distension.      Palpations: Abdomen is soft.      Tenderness: There is no abdominal tenderness.   Musculoskeletal:      Right lower leg: No edema.      Left lower leg: No edema.   Skin:     General: Skin is warm.      Coloration: Skin is not jaundiced.   Neurological:      General: No  focal deficit present.      Mental Status: She is alert and oriented to person, place, and time.   Psychiatric:         Mood and Affect: Mood normal.         Thought Content: Thought content normal.         Judgment: Judgment normal.        I/O:  I/O last 24 hours:    Intake/Output Summary (Last 24 hours) at 02/22/2023 1705  Last data filed at 02/22/2023 1630  Gross per 24 hour   Intake 800 ml   Output --   Net 800 ml     I/O current shift:  01/15 0700 - 01/15 1859  In: 480 [P.O.:480]  Out: -   Labs:  Reviewed: I have reviewed all lab results.  Lab Results Today:    Results for orders placed or performed during the hospital encounter of 02/20/23 (from the past 24 hour(s))   POC BLOOD GLUCOSE (RESULTS)   Result Value Ref Range    GLUCOSE, POC 204 (H) 70 - 100 mg/dl   POC BLOOD GLUCOSE (RESULTS)   Result Value Ref Range    GLUCOSE, POC 192  (H) 70 - 100 mg/dl   ECG 12 LEAD   Result Value Ref Range    Ventricular rate 58 BPM    Atrial Rate 58 BPM    PR Interval 158 ms    QRS Duration 72 ms    QT Interval 428 ms    QTC Calculation 420 ms    Calculated P Axis 71 degrees    Calculated R Axis 89 degrees    Calculated T Axis 22 degrees   MAGNESIUM   Result Value Ref Range    MAGNESIUM 2.0 1.9 - 2.7 mg/dL   PHOSPHORUS   Result Value Ref Range    PHOSPHORUS 4.0 3.7 - 7.2 mg/dL   PTT (PARTIAL THROMBOPLASTIN TIME)   Result Value Ref Range    APTT 32.5 25.0 - 38.0 seconds   PT/INR   Result Value Ref Range    PROTHROMBIN TIME 11.0 9.8 - 12.7 seconds    INR 0.97 0.84 - 1.10   COMPREHENSIVE METABOLIC PANEL, NON-FASTING   Result Value Ref Range    SODIUM 137 136 - 145 mmol/L    POTASSIUM 4.2 3.5 - 5.1 mmol/L    CHLORIDE 103 98 - 107 mmol/L    CO2 TOTAL 24 21 - 31 mmol/L    ANION GAP 10 4 - 13 mmol/L    BUN 19 7 - 25 mg/dL    CREATININE 2.84 1.32 - 1.30 mg/dL    BUN/CREA RATIO 29 (H) 6 - 22    ESTIMATED GFR 96 >59 mL/min/1.23m^2    ALBUMIN 3.8 3.5 - 5.7 g/dL    CALCIUM 9.2 8.6 - 44.0 mg/dL    GLUCOSE 102 (H) 74 - 109 mg/dL    ALKALINE PHOSPHATASE 80 34 - 104 U/L    ALT (SGPT) 16 7 - 52 U/L    AST (SGOT) 13 13 - 39 U/L    BILIRUBIN TOTAL 0.2 (L) 0.3 - 1.0 mg/dL    PROTEIN TOTAL 6.8 6.4 - 8.9 g/dL    ALBUMIN/GLOBULIN RATIO 1.3 0.8 - 1.4    OSMOLALITY, CALCULATED 286 270 - 290 mOsm/kg    CALCIUM, CORRECTED 9.4 8.9 - 10.8 mg/dL    GLOBULIN 3.0 2.0 - 3.5   BILIRUBIN, CONJUGATED (DIRECT)   Result Value Ref Range    BILIRUBIN DIRECT  0.10 0.03 - 0.18 md/dL   CBC WITH DIFF   Result Value Ref Range    WBC 8.5 3.8 - 11.8 x10^3/uL    RBC 4.74 3.63 - 4.92 x10^6/uL    HGB 13.4 10.9 - 14.3 g/dL    HCT 56.2 13.0 - 86.5 %    MCV 85.4 75.5 - 95.3 fL    MCH 28.3 24.7 - 32.8 pg    MCHC 33.1 32.3 - 35.6 g/dL    RDW 78.4 69.6 - 29.5 %    PLATELETS 273 140 - 440 x10^3/uL    MPV 8.6 7.9 - 10.8 fL    NEUTROPHIL % 60 43 - 77 %    LYMPHOCYTE % 29 16 - 44 %    MONOCYTE % 9 5 - 13 %    EOSINOPHIL  % 2 1 - 7 %    BASOPHIL % 0 0 - 1 %    NEUTROPHIL # 5.20 1.85 - 7.80 x10^3/uL    LYMPHOCYTE # 2.50 1.00 - 3.00 x10^3/uL    MONOCYTE # 0.70 0.30 - 1.00 x10^3/uL    EOSINOPHIL # 0.10 0.00 - 0.50 x10^3/uL    BASOPHIL # 0.00 0.00 - 0.10 x10^3/uL   POC BLOOD GLUCOSE (RESULTS)   Result Value Ref Range    GLUCOSE, POC 176 (H) 70 - 100 mg/dl   POC BLOOD GLUCOSE (RESULTS)   Result Value Ref Range    GLUCOSE, POC 303 (H) 70 - 100 mg/dl   POC BLOOD GLUCOSE (RESULTS)   Result Value Ref Range    GLUCOSE, POC 197 (H) 70 - 100 mg/dl     Micro Results:   No results found for any visits on 02/20/23 (from the past 24 hour(s)).    Images:   CT ANGIO CHEST FOR PULMONARY EMBOLUS W IV CONTRAST    Result Date: 02/20/2023  Impression GROUNDGLASS OPACITIES IN THE UPPER LOBES SUGGESTIVE OF AN INFECTIOUS BRONCHIOLITIS. NO EVIDENCE OF ACUTE PULMONARY EMBOLISM. 6 MM RIGHT UPPER LOBE PULMONARY NODULE. One or more dose reduction techniques were used (e.g., Automated exposure control, adjustment of the mA and/or kV according to patient size, use of iterative reconstruction technique). Radiologist location ID: [id number]     XR CHEST PA AND LATERAL    Result Date: 02/20/2023  Impression NO ACUTE FINDINGS. Radiologist location ID: [id number]     CT ANGIO CHEST FOR PULMONARY EMBOLUS W IV CONTRAST   Final Result   GROUNDGLASS OPACITIES IN THE UPPER LOBES SUGGESTIVE OF AN INFECTIOUS BRONCHIOLITIS. NO EVIDENCE OF ACUTE PULMONARY EMBOLISM.      6 MM RIGHT UPPER LOBE PULMONARY NODULE.         One or more dose reduction techniques were used (e.g., Automated exposure control, adjustment of the mA and/or kV according to patient size, use of iterative reconstruction technique).         Radiologist location ID: [id number]         XR CHEST PA AND LATERAL   Final Result   NO ACUTE FINDINGS.         Radiologist location ID: [id number]              Problem List:  Active Hospital Problems   (*Primary Problem)    Diagnosis    *Sepsis (CMS HCC)    Chest pain        Nutrition:    DIET DIABETIC Calorie amount: CC 1800; Do you want to initiate MNT Protocol? Yes    Additional clinical characteristics related to nutrition:    -  monitor for weight changes   - monitor intake and output    - monitor bowel functions                   Assessment/ Plan:   Suspected sepsis ruled out   Acute hypoxic respiratory failure  Bronchiolitis   -Patient was dx with COVID two weeks ago, her COVID 19 pcr is still positive   -CT of the chest showed groundglass opacities in the upper lobes suggestive of an infectious bronchiolitis, NO PE , 6mm right upper lobe pulmonary nodule  -empirically treat with IV rocephin and doxycycline  -neb treatments scheduled .   -mucinex   -respiratory panel negative   -strep and legionella antigen negative :  mycoplasma antibodies pending   -titrate O2 to maintain O2 sat 89-92%    DM  -SSI     HTN  -continue home medications    -further treatment adjustments will be made based off clinical course.  See orders for further details.  -Hospitalist personally evaluated and examined the patient in conjunction with the MLP and agree with the assessments, treatment plan and disposition of the patient as recorded by the [facility].     DVT/PE Prophylaxis: Enoxaparin    Ruben Reason, PA-C

## 2023-02-22 NOTE — Care Plan (Signed)
Problem: Adult Inpatient Plan of Care  Goal: Plan of Care Review  Outcome: Ongoing (see interventions/notes)  Goal: Patient-Specific Goal (Individualized)  Outcome: Ongoing (see interventions/notes)  Flowsheets (Taken 02/21/2023 2221)  Individualized Care Needs: Monitor for signs of sepsis, blood glucose, labs, vitals  Anxieties, Fears or Concerns: "No"  Goal: Absence of Hospital-Acquired Illness or Injury  Outcome: Ongoing (see interventions/notes)  Intervention: Prevent and Manage VTE (Venous Thromboembolism) Risk  Recent Flowsheet Documentation  Taken 02/21/2023 2230 by Birder Robson, RN  VTE Prevention/Management: ambulation promoted  Goal: Optimal Comfort and Wellbeing  Outcome: Ongoing (see interventions/notes)  Goal: Rounds/Family Conference  Outcome: Ongoing (see interventions/notes)     Problem: Sepsis/Septic Shock  Goal: Optimal Coping  Outcome: Ongoing (see interventions/notes)  Goal: Blood Glucose Level Within Target Range  Outcome: Ongoing (see interventions/notes)  Goal: Absence of Infection Signs and Symptoms  Outcome: Ongoing (see interventions/notes

## 2023-02-23 DIAGNOSIS — Z7985 Long-term (current) use of injectable non-insulin antidiabetic drugs: Secondary | ICD-10-CM

## 2023-02-23 LAB — STREPTOCOCCUS PNEUMONIAE ANTIGEN,URINE: S.PNEUMONIA ANTIGEN: NEGATIVE

## 2023-02-23 LAB — POC BLOOD GLUCOSE (RESULTS)
GLUCOSE, POC: 209 mg/dL — ABNORMAL HIGH (ref 70–100)
GLUCOSE, POC: 334 mg/dL — ABNORMAL HIGH (ref 70–100)

## 2023-02-23 LAB — LEGIONELLA URINE ANTIGEN: LEGIONELLA ANTIGEN: NEGATIVE

## 2023-02-23 MED ORDER — BENZONATATE 100 MG CAPSULE
100.0000 mg | ORAL_CAPSULE | Freq: Three times a day (TID) | ORAL | 0 refills | Status: AC | PRN
Start: 2023-02-23 — End: 2023-03-02

## 2023-02-23 MED ORDER — INSULIN GLARGINE 100 UNITS/ML SUBQ - CHARGE BY DOSE
10.0000 [IU] | Freq: Every evening | SUBCUTANEOUS | Status: DC
Start: 2023-02-23 — End: 2023-02-23

## 2023-02-23 MED ORDER — LEVOFLOXACIN 750 MG TABLET
750.0000 mg | ORAL_TABLET | ORAL | 0 refills | Status: AC
Start: 2023-02-23 — End: 2023-02-25

## 2023-02-23 MED ORDER — PREDNISONE 20 MG TABLET
40.0000 mg | ORAL_TABLET | Freq: Every morning | ORAL | 0 refills | Status: DC
Start: 2023-02-24 — End: 2023-03-03

## 2023-02-23 MED ORDER — PREDNISONE 20 MG TABLET
40.0000 mg | ORAL_TABLET | Freq: Every morning | ORAL | Status: DC
Start: 2023-02-23 — End: 2023-02-23
  Administered 2023-02-23: 40 mg via ORAL
  Filled 2023-02-23: qty 2

## 2023-02-23 MED ORDER — ASPIRIN 81 MG TABLET,DELAYED RELEASE
81.0000 mg | DELAYED_RELEASE_TABLET | Freq: Every day | ORAL | Status: AC
Start: 2023-02-23 — End: ?

## 2023-02-23 MED ORDER — BENZONATATE 100 MG CAPSULE
100.0000 mg | ORAL_CAPSULE | Freq: Three times a day (TID) | ORAL | Status: DC | PRN
Start: 2023-02-23 — End: 2023-02-23

## 2023-02-23 NOTE — Care Plan (Signed)
Problem: Adult Inpatient Plan of Care  Goal: Plan of Care Review  Outcome: Ongoing (see interventions/notes)  Goal: Patient-Specific Goal (Individualized)  Outcome: Ongoing (see interventions/notes)  Flowsheets (Taken 02/22/2023 2100)  Individualized Care Needs: Monitor vs & labs, monitor blood glucose, respiratory status  Anxieties, Fears or Concerns: None voiced at this time  Goal: Absence of Hospital-Acquired Illness or Injury  Outcome: Ongoing (see interventions/notes)  Intervention: Identify and Manage Fall Risk  Recent Flowsheet Documentation  Taken 02/22/2023 2100 by Loleta Books, RN  Safety Promotion/Fall Prevention:   activity supervised   fall prevention program maintained   nonskid shoes/slippers when out of bed   safety round/check completed  Intervention: Prevent Skin Injury  Recent Flowsheet Documentation  Taken 02/22/2023 2100 by Loleta Books, RN  Body Position: sitting  Skin Protection:   adhesive use limited   protective footwear used   tubing/devices free from skin contact  Intervention: Prevent and Manage VTE (Venous Thromboembolism) Risk  Recent Flowsheet Documentation  Taken 02/23/2023 0011 by Loleta Books, RN  VTE Prevention/Management: ambulation promoted  Taken 02/22/2023 2116 by Loleta Books, RN  VTE Prevention/Management: ambulation promoted  Taken 02/22/2023 2100 by Loleta Books, RN  VTE Prevention/Management: ambulation promoted  Taken 02/22/2023 1954 by Loleta Books, RN  VTE Prevention/Management: ambulation promoted  Goal: Optimal Comfort and Wellbeing  Outcome: Ongoing (see interventions/notes)  Intervention: Provide Person-Centered Care  Recent Flowsheet Documentation  Taken 02/22/2023 2100 by Loleta Books, RN  Trust Relationship/Rapport:   care explained   choices provided   emotional support provided   empathic listening provided   questions answered   questions encouraged   reassurance provided   thoughts/feelings acknowledged  Goal: Rounds/Family Conference  Outcome: Ongoing (see interventions/notes)      Problem: Sepsis/Septic Shock  Goal: Optimal Coping  Outcome: Ongoing (see interventions/notes)  Intervention: Support Patient and Family Response  Recent Flowsheet Documentation  Taken 02/22/2023 2100 by Loleta Books, RN  Supportive Measures: active listening utilized  Goal: Absence of Bleeding  Outcome: Ongoing (see interventions/notes)  Goal: Blood Glucose Level Within Target Range  Outcome: Ongoing (see interventions/notes)  Goal: Absence of Infection Signs and Symptoms  Outcome: Ongoing (see interventions/notes)  Intervention: Promote Stabilization  Recent Flowsheet Documentation  Taken 02/22/2023 2100 by Loleta Books, RN  Fever Reduction/Comfort Measures: room temperature adjusted  Intervention: Promote Recovery  Recent Flowsheet Documentation  Taken 02/22/2023 2100 by Loleta Books, RN  Sleep/Rest Enhancement: awakenings minimized  Activity Management: up ad lib  Goal: Optimal Nutrition Delivery  Outcome: Ongoing (see interventions/notes)     Problem: Covid-19 confirmed or rule-out  Description: Patient requires specific care to avoid complications secondary to COVID-19  Goal: Patient will remain free of complications due to COVID-19  Description: 1. Review and update the patient's isolation status in the Isolation activity.  2. Keep the patient's door closed at all times and limit movement of the patient outside of the room to medically essential purposes. If applicable, transfer patient to a single-person room.   3. Educate and reinforce infection prevention and control practices recommended by CDC.  4. Frequently monitor for development of more severe symptoms.  5. Use appropriate PPE when providing care for patient.  6. Reinforce current visitation policy and adherence to proper hand hygiene and PPE.  7. Avoid procedures that are likely to induce coughing (e.g., sputum induction, open suctioning of airways). If required, do so in a Airborne Infection Isolation Room. The health care provider in the room should wear an  N95 or higher-level respirator,  eye protection, gloves, and a gown. The number of HCP present during the procedure should be limited to only those essential for patient care and procedure support. Visitors should not be present for the procedure. Clean and disinfect procedure room surfaces promptly.  8. Update patient, family, and patient representatives as needed.  Outcome: Ongoing (see interventions/notes)

## 2023-02-23 NOTE — PT Evaluation (Signed)
[facility] Medicine [facility]  [address]  [place], [address]  [phone]  (Fax) [phone]  Rehabilitation Services  Physical Therapy Inpatient Initial Evaluation    Patient Name: [name] [name]  Date of Birth: 1955/10/06  Height: Height: 160 cm (5\' 3" )  Weight: Weight: 85.1 kg (187 lb 11.2 oz)  Room/Bed: 304/A  Payor: [employer] MEDICARE / Plan: [employer] MEDICARE ADV [place] / Product Type: PPO /       [id number] reviewed. No pertinent past medical history.        Assessment:      (P) Patient was admitted with covid, sepsis, bronchiloitis and abnormal stool. She states she feels much better and is ready to go home which is planned for today per patient and nurse. She completed bed mobility MI, transfers independently and ambulation without an AD with SBA for 60 feet in her room due to isolation. Further skilled PT services are not indicated due to patient's high level of function. She is planned for discharge to home today.    Total Distance Ambulated: (P) 60 feet  Independence: (P) stand-by assistance  Assistive Device: (P) other (see comments) (no assistive device)      Discharge Needs:    Equipment Recommendation: (P) none anticipated      The patient DOES NOT present with mobility limitations that significantly impair/prevent patient's ability to participate in mobility-related activities of daily living (MRADLs).     Discharge Disposition: (P) home    JUSTIFICATION OF DISCHARGE RECOMMENDATION   Based on current diagnosis, functional performance prior to admission, and current functional performance, this patient DOES NOT require continued PT services at this time DURING HOSPITALIZATION. See discharge disposition above.      Plan:   Evaluation only.  Patient planned for discharge to home today per nurse.       Subjective & Objective     History reviewed. No pertinent past medical history.       02/23/23 1459   Rehab Session   Document Type evaluation   PT Visit Date 02/23/23   General  Information   Patient Profile Reviewed yes   Pertinent History of Current Functional Problem Patient to ED with SOB HA and achiness. She was admitted with Covid, sepsis, bronchiolitis and abnormal stool. Order received for PT evaluation prior to discharge to home today.   Medical Lines PIV Line;Telemetry   Respiratory Status room air   Existing Precautions/Restrictions droplet isolation   Mutuality/Individual Preferences   Anxieties, Fears or Concerns No concerns, ready to go home   Living Environment   Lives With child(ren), adult;grandchild(ren);other (see comments)  (son, his fiancee and a grandson)   Living Arrangements house  (2 story with main level and finished lower level. Patient remains on main level.)   Home Accessibility stairs to enter home   Home Main Entrance   Stair Railings, Main Entrance none   Number of Stairs, Main Entrance three   Functional Level Prior   Ambulation 0 - independent   Transferring 0 - independent   Toileting 0 - independent   Bathing 0 - independent   Dressing 0 - independent   Eating 0 - independent   Communication 0 - understands/communicates without difficulty   Prior Functional Level Comment Family provides transportation. Patient may go stay with her daughter at discharge.   Pre Treatment Status   Pre Treatment Patient Status Patient supine in bed   Support Present Pre Treatment  None   Communication Pre Treatment  Charge  Nurse   Communication Pre Treatment Comment cleared for PT eval   Cognitive Assessment/Interventions   Behavior/Mood Observations behavior appropriate to situation, WNL/WFL;alert;cooperative   Orientation Status oriented x 4   Attention WNL/WFL   Follows Commands WNL   Pre- Treatment Vital Signs   Pre-Treatment Heart Rate (beats/min) 66   Pre SpO2 (%) 93   O2 Delivery Pre Treatment room air   Pre-Treatment Pain   Pretreatment Pain Rating 0/10 - no pain   RUE Assessment   RUE Assessment WFL- Within Functional Limits   LUE Assessment   LUE Assessment WFL-  Within Functional Limits   RLE Assessment   RLE Assessment WFL- Within Functional Limits   LLE Assessment   LLE Assessment WFL- Within Functional Limits   Trunk Assessment   Trunk Assessment WFL-Within Functional Limits   Mobility Assessment/Training   Additional Documentation Bed Mobility Assessment/Treatment (Group);Transfer Assessment/Treatment (Group);Gait Assessment/Treatment (Group)   Bed Mobility   Bed Mobility, Assistive Device bed rails   Roll Left Independence independent   Roll Right Independence independent   Scoot/Bridge Independence modified independence   Sit to Supine, Independence independent   Supine-Sit Independence modified independence   Transfer Assessment/Treatment   Sit-Stand-Sit, Assist Device None   Sit-Stand Independence independent   Stand-Sit Independence independent   Gait Assessment/Treatment   Total Distance Ambulated 60   Gait Speed normal   Assistive Device  other (see comments)  (no assistive device)   Comment no unsteadiness or LOB   Distance in Feet 60 feet in room due to isolation   Independence  stand-by assistance   Post Treatment Status   Post Treatment Patient Status Patient supine in bed;Call light within reach   Support Present Post Treatment  None   Communication Post Treatement Nurse   Communication Post Treatment Comment updated on status of eval   Patient Effort good   Post-Treatment Vital Signs   Post-treatment Heart Rate (beats/min) 68   Post SpO2 (%) 94   O2 Delivery Post Treatment room air   Post-Treatment Pain   Posttreatment Pain Rating 0/10 - no pain   Physical Therapy Clinical Impression   Assessment Patient was admitted with covid, sepsis, bronchiloitis and abnormal stool. She states she feels much better and is ready to go home which is planned for today per patient and nurse. She completed bed mobility MI, transfers independently and ambulation without an AD with SBA for 60 feet in her room due to isolation. Further skilled PT services are not indicated due  to patient's high level of function. She is planned for discharge to home today.   Predicted Duration of Therapy Intervention (days/wks) evaluation only   Anticipated Equipment Needs at Discharge (PT) none anticipated   Anticipated Discharge Disposition home   Evaluation Complexity Justification   Patient History: Co-morbidity/factors that impact Plan of Care 1-2 that impact Plan of Care   Examination Components 3 or more Exam elements addressed   Presentation Stable: Uncomplicated, straight-forward, problem focused   Clinical Decision Making Low complexity   Evaluation Complexity Low complexity   Physical Therapy Time and Intention   Total PT Minutes: 16               INTERVENTION MINUTES: EVALUATION 16 minutes    EVALUATION COMPLEXITY : CLINICAL DECISION MAKING OF LOW COMPLEXITY AS INDICATED BY PMH, PHYSICAL THERAPY ASSESSMENT OF MUSCULOSKELETAL AND NEUROLOGICAL SYSTEMS AND ACTIVITY LIMITATIONS. CLINICAL PRESENTATION IS STABLE AND UNCOMPLICATED    Therapist:     Rollene Rotunda, PT  02/23/2023,  16:30

## 2023-02-23 NOTE — Nurses Notes (Signed)
Patient discharged home with family.  AVS reviewed with patient/care giver.  A written copy of the AVS and discharge instructions was given to the patient/care giver. Scripts handed to patient/care giver. Questions sufficiently answered as needed.  Patient/care giver encouraged to follow up with PCP as indicated.  In the event of an emergency, patient/care giver instructed to call 911 or go to the nearest emergency room. IV and telemetry removed. Patient transported via wheelchair.

## 2023-02-23 NOTE — Discharge Summary (Signed)
[place] MEDICINE [facility]     DISCHARGE SUMMARY      PATIENT NAME:  [name] [name]   MRN:  [id number]  DOB:  10-Jun-1955    INPATIENT ADMISSION DATE: 02/20/2023   DATE OF DISCHARGE:  02/23/23     ATTENDING PHYSICIAN: Linus Orn, DO     PRIMARY CARE PHYSICIAN: Sheralyn Boatman, NP     HOSPITAL PRESENTATION:    Please see full admission H&P for details.      As per HPI:    [name] [name] 68 y.o. female  with multiple medical problems including diabetes liver disease reactive airway  Patient presents on January 13th to the [facility] ER with shortness of Breath.     Patient and daughter at bedside give history.  Patient lives with her son his fiancee, and her grandson.  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  Multiple people at home have been sick over the past month or so, and 2 weeks ago the patient was diagnosed with both strep and COVID.  She noted that she was sleeping with grandson who had flu but she was negative.  She had a fever for a couple of days and then went to her primary care doctor and was given Augmentin.  Her primary care doctor saw her after Christmas, and noted that she was not getting any better and was switched to clindamycin instead.  Over the weekend she notes that she was not feeling herself thought we had, had a headache as well.  Patient denies frank diuresis but has had episodes where she feels flushed.She also notes a pressure-like soreness or ache that is in the center of her chest bilateral back that is constant in nature and does not really ever completely go away, and has felt this for the past week  and  Patient states that she has been not feeling well for quite some time, some symptoms have been chronic some symptoms have been more noticeable mostly over the past several days..  She has been having increased shortness of breath over the past week. The shortness of breath is general at baseline and not  significantly changed with exertion.  Cough nonproductive, no vomiting and diarrhea, patient has "white coat stools".  No other significant complaints or concerns.  He denies blood in urine or stool, denies dysuria.  She saw her primary care doctor who told her that not much else could be done for her and she needs to go to the ER so she came.  Patient is not normally on oxygen but is on 2 L nasal cannula here in the ER.  CT found to be negative for PE but positive for ground-glass opacities and suggestive of infectious bronchiolitis.                                          FURTHER HOSPITAL COURSE WITH DISCHARGE DIAGNOSES:    Ms. Basom was admitted to the hospitalist service for treatment of acute respiratory failure with hypoxia secondary to bronchiolitis.   Her CT scan performed in the Emergency department was negative for pulmonary emboli but was found to have groundglass opacities in the upper lobes suggestive of an infectious bronchiolitis.   She was also found to have a 6mm upper lobe pulmonary nodule.  She was initially treated with bronchodilators, incentive spirometry,  ceftriaxone and doxycycline.  On the day of admission she was requiring 2L of oxygen.  By the next hospital day the patient was no longer requiring oxygen.  She was still very short of breath with any exertion.  An extended respiratory panel was ordered and was negative for all viruses tested.  Her legionella an streptococcus urinary antigens were both negative.  Blood cultures remained negative throughout her stay.  On day two IV ceftriaxone and doxycycline were stopped due to a possible allergy to ceftriaxone and replaced with Levaquin.  By 02/22/22 the patient was feeling better and although she was still having some dyspnea was eager to go home.  She ambulated with physical therapy without difficulty and her oxygen saturation stayed WNL on room air.  In addition she was noticed to have some intermittent bradycardia during her hospital  stay.  This mostly occurred at night.  Her metoprolol was discontinued.  The need for this can be reevaluated when she follows up with her PCP.   On 02/23/23 she was discharged home with family.  She is to follow up with  Dr. Gaynell Face at [facility] on March 02, 2023. A Rx for Levaquin, prednisone, and tessalon was sent to her pharmacy.      Follow up on 6mm pulmonary nodule      Problem List:  Bronchiolitis  Acute respiratory failure with hypoxia (resolved)  Suspected sepsis (ruled out)  DM  HTN    Nutrition:    No diet orders on file    Additional clinical characteristics related to nutrition:    - monitor for weight changes   - monitor intake and output    - monitor bowel functions                     PHYSICAL EXAM   DAY OF DISCHARGE:    BP 130/63   Pulse 72   Temp 36.6 C (97.9 F)   Resp 18   Ht 1.6 m (5\' 3" )   Wt 85.1 kg (187 lb 11.2 oz)   SpO2 94%   BMI 33.25 kg/m          General:  Patient is resting in bed, no acute distress, alert and oriented x3   Eyes:  PERRL, no scleral icterus   HENT:  Normocephalic, atraumatic, oral mucosa is moist and pink, no nasal discharge   Heart:  RRR, S1 and S2 auscultated, no murmurs appreciated   Lungs:  Unlabored respirations.  Lungs are clear to auscultation bilaterally, no wheezes, no rales  Abdomen:  Soft, active bowel sounds, non-tender to palpation, non-distended  Extremities:  Pulses equal in all extremities bilaterally.  Capillary refill less than 3 seconds.  No edema in lower extremities bilaterally   Skin:  Warm and dry.  Not diaphoretic  Neuro:  A&O x 3.  No focal deficits.  Speech intact.  Not tremulous  Psych:  Cooperative, not agitated    LABS:  CBC with Diff (Last 48 Hours):    Recent Results in last 48 hours     02/22/23  0427   WBC 8.5   HGB 13.4   HCT 40.5   MCV 85.4   PLTCNT 273   PMNS 60   LYMPHOCYTES 29   MONOCYTES 9   EOSINOPHIL 2   BASOPHILS 0  0.00          Last BMP  (Last result in the past 48 hours)        Na  K   Cl   CO2   BUN   Cr   Calcium    Glucose   Glucose-Fasting        02/22/23 0427 137   4.2   103   24   19   0.66   9.2   275               Last Hepatic Panel  (Last result in the past 48 hours)        Albumin   Total PTN   Total Bili   Direct Bili   Ast/SGOT   Alt/SGPT   Alk Phos        02/22/23 0427 3.8   6.8   0.2     13   16    80       02/22/23 0427       0.10                      BMP (Last 48 Hours):    Recent Results in last 48 hours     02/22/23  0427   SODIUM 137   POTASSIUM 4.2   CHLORIDE 103   CO2 24   BUN 19   CREATININE 0.66   CALCIUM 9.2   GLUCOSENF 275*          IMAGING:         DISCHARGE MEDICATIONS:     Current Discharge Medication List        START taking these medications.        Details   benzonatate 100 mg Capsule  Commonly known as: TESSALON   100 mg, Oral, EVERY 8 HOURS PRN  Qty: 21 Capsule  Refills: 0     levoFLOXacin 750 mg Tablet  Commonly known as: LEVAQUIN   750 mg, Oral, EVERY 24 HOURS  Qty: 2 Tablet  Refills: 0     predniSONE 20 mg Tablet  Commonly known as: DELTASONE  Start taking on: February 24, 2023   40 mg, Oral, EVERY MORNING WITH BREAKFAST  Qty: 8 Tablet  Refills: 0            CONTINUE these medications - NO CHANGES were made during your visit.        Details   * albuterol sulfate 90 mcg/actuation oral inhaler  Commonly known as: PROVENTIL or VENTOLIN or PROAIR   1 Puff, Inhalation, EVERY 6 HOURS PRN  Refills: 0     * albuterol sulfate 2.5 mg /3 mL (0.083 %) nebulizer solution  Commonly known as: PROVENTIL   2.5 mg, Nebulization, EVERY 6 HOURS PRN  Refills: 0     Amlodipine-Benazepril 10-40 mg Capsule   1 Capsule, Oral, DAILY  Refills: 0     aspirin 81 mg Tablet, Delayed Release (E.C.)  Commonly known as: ECOTRIN   81 mg, Oral, DAILY  Refills: 0     cetirizine 10 mg Tablet  Commonly known as: zyrTEC   10 mg, Oral, DAILY PRN  Refills: 0     cyclobenzaprine 10 mg Tablet  Commonly known as: FLEXERIL   10 mg, Oral, 3 TIMES DAILY PRN  Refills: 0     fluticasone propionate 50 mcg/actuation Spray, Suspension  Commonly  known as: FLONASE   2 Sprays, Each Nostril, DAILY  Refills: 0     Mounjaro 5 mg/0.5 mL Pen Injector  Generic drug: tirzepatide   5 mg, Subcutaneous, EVERY 7 DAYS  Refills: 0     omeprazole  40 mg Capsule, Delayed Release(E.C.)  Commonly known as: PRILOSEC   40 mg, Oral, DAILY  Refills: 0     Symbicort 160-4.5 mcg/actuation oral inhaler  Generic drug: budesonide-formoteroL   2 Puffs, Inhalation, 2 TIMES DAILY  Refills: 0     Synjardy 12.5-1,000 mg Tablet  Generic drug: empagliflozin-metformin   1 Tablet, Oral, 2 TIMES DAILY  Refills: 0           * This list has 2 medication(s) that are the same as other medications prescribed for you. Read the directions carefully, and ask your doctor or other care provider to review them with you.                STOP taking these medications.      clindamycin 300 mg Capsule  Commonly known as: CLEOCIN     metoprolol tartrate 25 mg Tablet  Commonly known as: LOPRESSOR                 DISCHARGE DISPOSITION:  Home with family    DISCHARGE INSTRUCTIONS:    Follow-up Information       Magdalene Patricia, MD Follow up in 1 week(s).    Specialty: INTERNAL MEDICINE  Why: f/u after hospitalization, Follow-up 03/02/23 at [facility] information:  [address]  [place] [address]  [phone]                           No discharge procedures on file.   Follow-up Information       Sheralyn Boatman, NP Follow up in 1 week(s).    Specialty: NURSE PRACTITIONER  Why: Patient to follow up with Azucena Cecil within one week of hospital discharge date  Contact information:  [address]  [place] [address]  [phone]                                    Copies sent to Care Team         Relationship Specialty Notifications Start End    Sheralyn Boatman, NP PCP - General NURSE PRACTITIONER  12/26/22     Phone: [phone] Fax: [phone]         [address] [facility] [place] [address]            >30 minutes total were spent coordinating discharge day today      Linus Orn, DO  [facility]  MEDICINE HOSPITALIST

## 2023-02-24 ENCOUNTER — Telehealth (INDEPENDENT_AMBULATORY_CARE_PROVIDER_SITE_OTHER): Payer: Self-pay

## 2023-02-24 LAB — MYCOPLASMA PNEUMONIAE ANTIBODIES, IGG AND IGM, SERUM
M. PNEUMONIAE AB (IGG): <=0.9 (ref <=0.90)
M. PNEUMONIAE AB (IGM): 129 U/mL (ref <770)

## 2023-02-25 LAB — ADULT ROUTINE BLOOD CULTURE, SET OF 2 BOTTLES (BACTERIA AND YEAST)
BLOOD CULTURE, ROUTINE: NO GROWTH
BLOOD CULTURE, ROUTINE: NO GROWTH

## 2023-03-02 ENCOUNTER — Inpatient Hospital Stay (INDEPENDENT_AMBULATORY_CARE_PROVIDER_SITE_OTHER): Payer: Self-pay | Admitting: PHYSICIAN ASSISTANT

## 2023-03-03 ENCOUNTER — Inpatient Hospital Stay (INDEPENDENT_AMBULATORY_CARE_PROVIDER_SITE_OTHER): Payer: Medicare Other | Admitting: PHYSICIAN ASSISTANT

## 2023-03-03 ENCOUNTER — Encounter (INDEPENDENT_AMBULATORY_CARE_PROVIDER_SITE_OTHER): Payer: Self-pay | Admitting: PHYSICIAN ASSISTANT

## 2023-03-03 ENCOUNTER — Other Ambulatory Visit: Payer: Self-pay

## 2023-03-03 ENCOUNTER — Ambulatory Visit (INDEPENDENT_AMBULATORY_CARE_PROVIDER_SITE_OTHER): Payer: Medicare Other | Admitting: PHYSICIAN ASSISTANT

## 2023-03-03 VITALS — BP 142/61 | HR 72 | Temp 97.8°F | Ht 63.0 in | Wt 187.0 lb

## 2023-03-03 DIAGNOSIS — R911 Solitary pulmonary nodule: Secondary | ICD-10-CM

## 2023-03-03 DIAGNOSIS — I10 Essential (primary) hypertension: Secondary | ICD-10-CM

## 2023-03-03 DIAGNOSIS — R002 Palpitations: Secondary | ICD-10-CM

## 2023-03-03 DIAGNOSIS — J219 Acute bronchiolitis, unspecified: Secondary | ICD-10-CM

## 2023-03-03 DIAGNOSIS — Z09 Encounter for follow-up examination after completed treatment for conditions other than malignant neoplasm: Secondary | ICD-10-CM

## 2023-03-03 DIAGNOSIS — R0600 Dyspnea, unspecified: Secondary | ICD-10-CM

## 2023-03-03 MED ORDER — PREDNISONE 10 MG TABLET
10.0000 mg | ORAL_TABLET | Freq: Every day | ORAL | 0 refills | Status: AC
Start: 2023-03-03 — End: 2023-03-08

## 2023-03-03 MED ORDER — METOPROLOL SUCCINATE ER 25 MG TABLET,EXTENDED RELEASE 24 HR
12.5000 mg | ORAL_TABLET | Freq: Every day | ORAL | 0 refills | Status: DC
Start: 2023-03-03 — End: 2023-11-21

## 2023-03-03 MED ORDER — PREDNISONE 10 MG TABLET
10.0000 mg | ORAL_TABLET | Freq: Every day | ORAL | 0 refills | Status: DC
Start: 2023-03-03 — End: 2023-03-03

## 2023-03-03 NOTE — Nursing Note (Signed)
03/03/23 1658   Recent Weight Change   Have you had a recent unexplained weight loss or gain? N   Domestic Violence   Because we are aware of abuse and domestic violence today, we ask all patients: Are you being hurt, hit, or frightened by anyone at your home or in your life?  N   Basic Needs   Do you have any basic needs within your home that are not being met? (such as Food, Shelter, [employer] fast streamer, Tranportation, paying for bills and/or medications) N   Advanced Directives   Do you have any advanced directives? No Advance   Would you like an advanced directive packet? Refused Packet

## 2023-03-03 NOTE — Nursing Note (Signed)
03/03/23 1659   Depression Screen   Little interest or pleasure in doing things. 0   Feeling down, depressed, or hopeless 0   PHQ 2 Total 0

## 2023-03-03 NOTE — Nursing Note (Signed)
03/03/23 1659   Fall Risk Assessment   Do you feel unsteady when standing or walking? No   Do you worry about falling? No   Have you fallen in the past year? No

## 2023-03-03 NOTE — Progress Notes (Signed)
FAMILY MEDICINE, MEDICAL OFFICE BUILDING  [address]  [place] [address]    Transitional Care Management Note    Name: [name] MRN:  [id number]   Date: 03/03/2023 Age: 68 y.o.     Chief Complaint: Hospital Follow Up (Heart racing and walking makes her out of breath) and Hospital Discharge Transition       SUBJECTIVE:  [name] [name] is a 68 y.o. female presenting today for follow-up after being discharged from [facility]. The main problem requiring admission was COVID, infectious bronchiolitis with acute resp failure. She finished her antibiotics and prednisone. She reports she is still getting short of breath and fatigued easily. Heart racing at times. They discontinued her lopressor in the hospital due to bradycardia episodes. Took 1/2 of her metoprolol the other night because her BP had been elevated to 160s systolic. Running 140's systolic on average. She is not feeling herself.   Denies cough, fever, chest pain. Admits to some pressure in her back.   Denies nausea, vomiting, abdominal pain.   She reports not checking BS. She reports last A1C in the 7's, but not taking her Mounjaro since December. Has a lot of GI side effects.     OBJECTIVE:   BP (!) 142/61   Pulse 72   Temp 36.6 C (97.8 F) (Temporal)   Ht 1.6 m (5\' 3" )   Wt 84.8 kg (187 lb)   SpO2 98%   BMI 33.13 kg/m     Head: NCAT  Eyes: clear. EOMI.   ENT: unremarkable.  Lungs: clear. No wheezes, rhonchi  Heart: regular rate and rhythm.  Abd: soft, NT/ND. BS active  Ext: intact x 4. No edema.     Transition of Care Contact Information  Discharge date: Discharge Date: 02/23/2023  Transition Facility Type--Hospital (Inpatient or Observation)  Facility Name--[facility] Interactive Contact(s):  First Attempt Call: 02/24/2023 11:39 AM  Second Attempted Contact: 02/24/2023  3:37 PM  Contact Method(s)-- Patient/Caregiver Telephone  Clinical Staff Name/Role who Shona Needles LPN     Data Reviewed  Medication  Reconciliation completed    Assessment and Plan    ICD-10-CM    1. Hospital discharge follow-up  Z09       2. Palpitations  R00.2 metoprolol succinate (TOPROL-XL) 25 mg Oral Tablet Sustained Release 24 hr      3. Dyspnea, unspecified type  R06.00 predniSONE (DELTASONE) 10 mg Oral Tablet     DISCONTINUED: predniSONE (DELTASONE) 10 mg Oral Tablet      4. Hypertension, unspecified type  I10 metoprolol succinate (TOPROL-XL) 25 mg Oral Tablet Sustained Release 24 hr      5. Bronchiolitis  J21.9 predniSONE (DELTASONE) 10 mg Oral Tablet     DISCONTINUED: predniSONE (DELTASONE) 10 mg Oral Tablet        Other transition actions (Optional) -: Discharge documentation was reviewed    We will start metoprolol back at 12.5mg  daily. I feel rebound tachycardia from d/c of her beta blocker might be part of why she is feeling palpitations and dyspnea. I will do a short round of lower dose prednisone for her bronchiolitis and have her continue her Symbicort. Watch blood sugar with prednisone. Will need to have some sort of Diabetes medication changes in the future if she doesn't want to continue the [facility]. She will need to check a CT of her chest in 3-6 months to follow up on the incidental nodule seen. I have encouraged the patient to monitor BP at home and keep  log. I strongly counseled if symptoms were to worsen she should present back to the ER she understood and was agreeable to this.    Return in about 10 days (around 03/13/2023).    Bonner Puna, PA-C

## 2023-04-24 LAB — POC BLOOD GLUCOSE (RESULTS): GLUCOSE, POC: 171 mg/dL — ABNORMAL HIGH (ref 70–100)

## 2023-05-24 ENCOUNTER — Other Ambulatory Visit (HOSPITAL_COMMUNITY): Payer: Self-pay | Admitting: Family Medicine

## 2023-05-24 DIAGNOSIS — J984 Other disorders of lung: Secondary | ICD-10-CM

## 2023-05-25 ENCOUNTER — Encounter (HOSPITAL_COMMUNITY): Payer: Self-pay

## 2023-06-26 ENCOUNTER — Other Ambulatory Visit

## 2023-06-27 ENCOUNTER — Other Ambulatory Visit (HOSPITAL_COMMUNITY): Payer: Self-pay | Admitting: Family Medicine

## 2023-06-27 DIAGNOSIS — J984 Other disorders of lung: Secondary | ICD-10-CM

## 2023-07-20 ENCOUNTER — Other Ambulatory Visit: Payer: Self-pay

## 2023-07-20 ENCOUNTER — Ambulatory Visit
Admission: RE | Admit: 2023-07-20 | Discharge: 2023-07-20 | Disposition: A | Discharge status: Home / Self Care | Payer: Self-pay | Source: Ambulatory Visit | Attending: Family Medicine | Admitting: Family Medicine

## 2023-07-20 DIAGNOSIS — Z87891 Personal history of nicotine dependence: Secondary | ICD-10-CM

## 2023-07-20 DIAGNOSIS — R911 Solitary pulmonary nodule: Secondary | ICD-10-CM

## 2023-07-20 DIAGNOSIS — J984 Other disorders of lung: Secondary | ICD-10-CM | POA: Insufficient documentation

## 2023-07-20 DIAGNOSIS — R918 Other nonspecific abnormal finding of lung field: Secondary | ICD-10-CM

## 2023-09-07 ENCOUNTER — Other Ambulatory Visit (HOSPITAL_COMMUNITY): Payer: Self-pay | Admitting: NURSE PRACTITIONER

## 2023-09-07 DIAGNOSIS — Z1231 Encounter for screening mammogram for malignant neoplasm of breast: Secondary | ICD-10-CM

## 2023-09-14 ENCOUNTER — Other Ambulatory Visit: Payer: Self-pay

## 2023-09-14 ENCOUNTER — Ambulatory Visit
Admission: RE | Admit: 2023-09-14 | Discharge: 2023-09-14 | Disposition: A | Discharge status: Home / Self Care | Payer: Self-pay | Source: Ambulatory Visit | Attending: NURSE PRACTITIONER | Admitting: NURSE PRACTITIONER

## 2023-09-14 ENCOUNTER — Encounter (HOSPITAL_COMMUNITY): Payer: Self-pay

## 2023-09-14 DIAGNOSIS — Z1231 Encounter for screening mammogram for malignant neoplasm of breast: Secondary | ICD-10-CM | POA: Insufficient documentation

## 2023-10-25 ENCOUNTER — Emergency Department (HOSPITAL_COMMUNITY)

## 2023-10-25 ENCOUNTER — Emergency Department
Admission: EM | Admit: 2023-10-25 | Discharge: 2023-10-25 | Disposition: A | Discharge status: Home / Self Care | Attending: Family Medicine | Admitting: Family Medicine

## 2023-10-25 ENCOUNTER — Other Ambulatory Visit: Payer: Self-pay

## 2023-10-25 ENCOUNTER — Encounter (HOSPITAL_COMMUNITY): Payer: Self-pay

## 2023-10-25 DIAGNOSIS — M545 Low back pain, unspecified: Secondary | ICD-10-CM

## 2023-10-25 DIAGNOSIS — R059 Cough, unspecified: Secondary | ICD-10-CM

## 2023-10-25 DIAGNOSIS — R9431 Abnormal electrocardiogram [ECG] [EKG]: Secondary | ICD-10-CM | POA: Insufficient documentation

## 2023-10-25 DIAGNOSIS — M25552 Pain in left hip: Secondary | ICD-10-CM | POA: Insufficient documentation

## 2023-10-25 DIAGNOSIS — J45909 Unspecified asthma, uncomplicated: Secondary | ICD-10-CM | POA: Insufficient documentation

## 2023-10-25 DIAGNOSIS — Z8701 Personal history of pneumonia (recurrent): Secondary | ICD-10-CM | POA: Insufficient documentation

## 2023-10-25 DIAGNOSIS — M25551 Pain in right hip: Secondary | ICD-10-CM

## 2023-10-25 DIAGNOSIS — M62830 Muscle spasm of back: Secondary | ICD-10-CM | POA: Insufficient documentation

## 2023-10-25 DIAGNOSIS — M47816 Spondylosis without myelopathy or radiculopathy, lumbar region: Secondary | ICD-10-CM | POA: Insufficient documentation

## 2023-10-25 DIAGNOSIS — R0600 Dyspnea, unspecified: Secondary | ICD-10-CM

## 2023-10-25 DIAGNOSIS — I10 Essential (primary) hypertension: Secondary | ICD-10-CM | POA: Insufficient documentation

## 2023-10-25 DIAGNOSIS — J069 Acute upper respiratory infection, unspecified: Secondary | ICD-10-CM | POA: Insufficient documentation

## 2023-10-25 HISTORY — DX: Type 2 diabetes mellitus without complications: E11.9

## 2023-10-25 HISTORY — DX: Essential (primary) hypertension: I10

## 2023-10-25 HISTORY — DX: Unspecified asthma, uncomplicated: J45.909

## 2023-10-25 LAB — CBC WITH DIFF
BASOPHIL #: 0.1 x10ˆ3/uL (ref 0.00–0.10)
BASOPHIL %: 1 % (ref 0–1)
EOSINOPHIL #: 0.1 x10ˆ3/uL (ref 0.00–0.50)
EOSINOPHIL %: 1 % (ref 1–7)
HCT: 42.3 % — ABNORMAL HIGH (ref 31.2–41.9)
HGB: 14.3 g/dL (ref 10.9–14.3)
LYMPHOCYTE #: 2.3 x10ˆ3/uL (ref 1.10–3.10)
LYMPHOCYTE %: 19 % (ref 16–46)
MCH: 28.7 pg (ref 24.7–32.8)
MCHC: 33.7 g/dL (ref 32.3–35.6)
MCV: 85.3 fL (ref 75.5–95.3)
MONOCYTE #: 0.7 x10ˆ3/uL (ref 0.20–0.90)
MONOCYTE %: 6 % (ref 4–11)
MPV: 8.6 fL (ref 7.9–10.8)
NEUTROPHIL #: 8.9 x10ˆ3/uL — ABNORMAL HIGH (ref 1.90–8.20)
NEUTROPHIL %: 73 % (ref 43–77)
PLATELETS: 276 x10ˆ3/uL (ref 140–440)
RBC: 4.96 x10ˆ6/uL — ABNORMAL HIGH (ref 3.63–4.92)
RDW: 14.1 % (ref 12.3–17.7)
WBC: 12.1 x10ˆ3/uL — ABNORMAL HIGH (ref 3.8–11.8)

## 2023-10-25 LAB — COMPREHENSIVE METABOLIC PANEL, NON-FASTING
ALBUMIN/GLOBULIN RATIO: 1.3 (ref 0.8–1.4)
ALBUMIN: 4.1 g/dL (ref 3.5–5.7)
ALKALINE PHOSPHATASE: 92 U/L (ref 34–104)
ALT (SGPT): 20 U/L (ref 7–52)
ANION GAP: 12 mmol/L (ref 4–13)
AST (SGOT): 13 U/L (ref 13–39)
BILIRUBIN TOTAL: 0.3 mg/dL (ref 0.3–1.0)
BUN/CREA RATIO: 18 (ref 6–22)
BUN: 14 mg/dL (ref 7–25)
CALCIUM, CORRECTED: 9.2 mg/dL (ref 8.9–10.8)
CALCIUM: 9.3 mg/dL (ref 8.6–10.3)
CHLORIDE: 102 mmol/L (ref 98–107)
CO2 TOTAL: 27 mmol/L (ref 21–31)
CREATININE: 0.79 mg/dL (ref 0.60–1.30)
ESTIMATED GFR: 81 mL/min/1.73mˆ2 (ref >59)
GLOBULIN: 3.1 (ref 2.0–3.5)
GLUCOSE: 180 mg/dL — ABNORMAL HIGH (ref 74–109)
OSMOLALITY, CALCULATED: 286 mosm/kg (ref 270–290)
POTASSIUM: 3.4 mmol/L — ABNORMAL LOW (ref 3.5–5.1)
PROTEIN TOTAL: 7.2 g/dL (ref 6.4–8.9)
SODIUM: 141 mmol/L (ref 136–145)

## 2023-10-25 LAB — PT/INR
INR: 1.03 (ref 0.84–1.10)
PROTHROMBIN TIME: 11.6 s (ref 9.8–12.7)

## 2023-10-25 LAB — COVID-19, FLU A/B, RSV RAPID BY PCR
INFLUENZA VIRUS TYPE A: NOT DETECTED
INFLUENZA VIRUS TYPE B: NOT DETECTED
RESPIRATORY SYNCTIAL VIRUS (RSV): NOT DETECTED
SARS-CoV-2: NOT DETECTED

## 2023-10-25 LAB — TROPONIN-I: TROPONIN I: 3 ng/L (ref <15)

## 2023-10-25 LAB — PTT (PARTIAL THROMBOPLASTIN TIME): APTT: 33.1 s (ref 25.0–38.0)

## 2023-10-25 MED ORDER — METHOCARBAMOL 500 MG TABLET
1000.0000 mg | ORAL_TABLET | ORAL | Status: AC
Start: 2023-10-25 — End: 2023-10-25
  Administered 2023-10-25: 1000 mg via ORAL

## 2023-10-25 MED ORDER — METHYLPREDNISOLONE SOD SUCC 125 MG SOLUTION FOR INJECTION WRAPPER
INTRAVENOUS | Status: AC
Start: 2023-10-25 — End: 2023-10-25
  Filled 2023-10-25: qty 2

## 2023-10-25 MED ORDER — KETOROLAC 30 MG/ML (1 ML) INJECTION SOLUTION
INTRAMUSCULAR | Status: AC
Start: 2023-10-25 — End: 2023-10-25
  Filled 2023-10-25: qty 1

## 2023-10-25 MED ORDER — IPRATROPIUM 0.5 MG-ALBUTEROL 3 MG (2.5 MG BASE)/3 ML NEBULIZATION SOLN
3.0000 mL | INHALATION_SOLUTION | RESPIRATORY_TRACT | Status: AC
Start: 2023-10-25 — End: 2023-10-25
  Administered 2023-10-25: 3 mL via RESPIRATORY_TRACT

## 2023-10-25 MED ORDER — KETOROLAC 30 MG/ML (1 ML) INJECTION SOLUTION
15.0000 mg | INTRAMUSCULAR | Status: AC
Start: 2023-10-25 — End: 2023-10-25
  Administered 2023-10-25: 15 mg via INTRAVENOUS

## 2023-10-25 MED ORDER — METHOCARBAMOL 500 MG TABLET
ORAL_TABLET | ORAL | Status: AC
Start: 2023-10-25 — End: 2023-10-25
  Filled 2023-10-25: qty 2

## 2023-10-25 MED ORDER — BUDESONIDE 0.5 MG/2 ML SUSPENSION FOR NEBULIZATION
1.0000 mg | INHALATION_SUSPENSION | RESPIRATORY_TRACT | Status: AC
Start: 2023-10-25 — End: 2023-10-25
  Administered 2023-10-25: 1 mg via RESPIRATORY_TRACT

## 2023-10-25 MED ORDER — METHYLPREDNISOLONE SOD SUCC 125 MG SOLUTION FOR INJECTION WRAPPER
62.5000 mg | INTRAVENOUS | Status: AC
Start: 2023-10-25 — End: 2023-10-25
  Administered 2023-10-25: 62.5 mg via INTRAVENOUS

## 2023-10-25 MED ORDER — NAPROXEN SODIUM 550 MG TABLET
550.0000 mg | ORAL_TABLET | Freq: Two times a day (BID) | ORAL | 0 refills | Status: DC | PRN
Start: 2023-10-25 — End: 2023-11-21

## 2023-10-25 MED ORDER — PREDNISONE 20 MG TABLET
40.0000 mg | ORAL_TABLET | Freq: Every day | ORAL | 0 refills | Status: DC
Start: 2023-10-26 — End: 2023-11-21

## 2023-10-25 NOTE — ED Provider Notes (Signed)
 [facility]  ED Primary Provider Note  Patient Name: [name] [name]  Patient Age: 68 y.o.  Date of Birth: 13-Jan-1956    Chief Complaint: Hip Pain        History of Present Illness       [name] [name] is a 68 y.o. female who had concerns including Hip Pain.   History of Present Illness  [name] is a 68 year old female with arthritis who presents with acute left hip pain.    She experienced an acute onset of left hip pain on Saturday evening following increased physical activity at her son's wedding. The pain is localized to the side of the left hip, extending into the buttocks, with a particularly tender spot. It is described as severe and exacerbated by movement, feeling like a 'huge sprain'. No recent falls or injuries. She has a history of arthritis and typically receives massages and hip injections every two to three months, usually on the right side, with the last injection two months ago. This is the first occurrence of severe pain on the left side, rendering her unable to walk without assistance. She has tried Flexeril , topical creams, heating pads, and massage devices without significant relief. No numbness or tingling in the left leg, though she occasionally experiences numbness in the right leg.    In addition to hip pain, she has respiratory symptoms, including a head cold that progressed to chest congestion. She received a steroid shot and antibiotics on Sunday. No fever, but significant shortness of breath and wheezing, managed with breathing treatments. She has a history of pneumonia and notes that respiratory issues can escalate quickly. No urinary symptoms, changes in stool, or sore throat, but significant drainage and difficulty coughing up thick secretions.         Review of Systems     No other overt Review of Systems are noted to be positive except noted in the HPI.      Historical Data   History Reviewed This Encounter: Medical History  Surgical  History  Family History  Social History      Physical Exam   ED Triage Vitals [10/25/23 1219]   BP (Non-Invasive) 121/60   Heart Rate 72   Respiratory Rate 18   Temperature 36.3 C (97.4 F)   SpO2 97 %   Weight 84.8 kg (187 lb)   Height 1.6 m (5' 3)         Nursing notes reviewed for what could be assessed. Past Medical, Surgical, and Social history reviewed for what has been completed.     Constitutional:  Obese female in no apparent distress.  Afebrile  Head: Normocephalic, atraumatic.  Mouth/Throat: no nasal discharge, posterior pharynx revealed mild erythema without evidence of exudate.  Airway was patent.  Eyes: EOM grossly intact, conjunctiva normal.  Cardiovascular: Regular Rate and Rhythm, extremities well perfused.  Pulmonary/Chest: No respiratory distress.  Mild wheezing scattered throughout lung fields without significant rales or rhonchi appreciated  Abdominal: Soft, non-tender, non-distended. Non peritoneal, no rebound, no guarding.  MSK: No Lower Extremity Edema.  Tenderness to palpation along the posterolateral aspect of the left hip without any obvious deformity.  Distal pulses intact and no motor or sensory deficits were appreciated.  Paraspinal muscle fullness of the left lumbar region with some mild tenderness to palpation, but no tenderness to palpation overlying the lumbar spine noted on examination.  Skin: Warm, dry, and intact  Neuro: Appropriate, CN II-XII grossly intact.  No motor or sensory deficits noted on examination  Psych: Cooperative     Physical Exam  HEENT: Throat normal.  CHEST: Wheezing present.         Procedures      Patient Data     Labs Ordered/Reviewed   COMPREHENSIVE METABOLIC PANEL, NON-FASTING - Abnormal; Notable for the following components:       Result Value    POTASSIUM 3.4 (*)     GLUCOSE 180 (*)     All other components within normal limits    Narrative:     Estimated Glomerular Filtration Rate (eGFR) is calculated using the CKD-EPI (2021) equation, intended for  patients 48 years of age and older. If gender is not documented or unknown, there will be no eGFR calculation.     CBC WITH DIFF - Abnormal; Notable for the following components:    WBC 12.1 (*)     RBC 4.96 (*)     HCT 42.3 (*)     NEUTROPHIL # 8.90 (*)     All other components within normal limits   PT/INR - Normal    Narrative:     In the setting of warfarin therapy, a moderate-intensity INR goal range is 2.0 to 3.0 and a high-intensity INR goal range is 2.5 to 3.5.    INR is ONLY validated to determine the level of anticoagulation with vitamin K antagonists (warfarin). Other factors may elevate the INR including but not limited to direct oral anticoagulants (DOACs), liver dysfunction, vitamin K deficiency, DIC, factor deficiencies, and factor inhibitors.   PTT (PARTIAL THROMBOPLASTIN TIME) - Normal   TROPONIN-I - Normal   COVID-19, FLU A/B, RSV RAPID BY PCR - Normal    Narrative:     Results are for the simultaneous qualitative identification of SARS-CoV-2 (formerly 2019-nCoV), Influenza A, Influenza B, and RSV RNA. These etiologic agents are generally detectable in nasopharyngeal and nasal swabs during the ACUTE PHASE of infection. Hence, this test is intended to be performed on respiratory specimens collected from individuals with signs and symptoms of upper respiratory tract infection who meet Centers for Disease Control and Prevention (CDC) clinical and/or epidemiological criteria for Coronavirus Disease 2019 (COVID-19) testing. CDC COVID-19 criteria for testing on human specimens is available at [facility] webpage information for Healthcare Professionals: Coronavirus Disease 2019 (COVID-19) ([email or web address]).     False-negative results may occur if the virus has genomic mutations, insertions, deletions, or rearrangements or if performed very early in the course of illness. Otherwise, negative results indicate virus specific RNA targets are not detected, however negative  results do not preclude SARS-CoV-2 infection/COVID-19, Influenza, or Respiratory syncytial virus infection. Results should not be used as the sole basis for patient management decisions. Negative results must be combined with clinical observations, patient history, and epidemiological information. If upper respiratory tract infection is still suspected based on exposure history together with other clinical findings, re-testing should be considered.    Test methodology:   Cepheid Xpert Xpress SARS-CoV-2/Flu/RSV Assay real-time polymerase chain reaction (RT-PCR) test on the GeneXpert Dx and Xpert Xpress systems.   CBC/DIFF    Narrative:     The following orders were created for panel order CBC/DIFF.  Procedure                               Abnormality         Status                     ---------                               -----------         ------  CBC WITH DIFF[[id number]]                Abnormal            Final result                 Please view results for these tests on the individual orders.   EXTRA TUBES    Narrative:     The following orders were created for panel order EXTRA TUBES.  Procedure                               Abnormality         Status                     ---------                               -----------         ------                     GOLD TOP ULAZ[[id number]]                                    In process                 GRAY TOP ULAZ[[id number]]                                    In process                   Please view results for these tests on the individual orders.   GOLD TOP TUBE   GRAY TOP TUBE       XR CHEST PA AND LATERAL   Final Result by Edi, Radresults In (09/17 1327)   NO ACUTE FINDINGS.            Radiologist location ID: [id number]         XR HIPS BILATERAL W PELVIS 3-4 VIEWS   Final Result by Edi, Radresults In (09/17 1310)   No acute fracture of the pelvis or either hip is noted.            Radiologist location ID: [id number]         XR LUMBAR SPINE  AP AND LAT   Final Result by Edi, Radresults In (09/17 1311)   MILD DEGENERATIVE CHANGES OF THE LUMBAR SPINE.                     Radiologist location ID: [id number]             Medical Decision Making          Medical Decision Making  Amount and/or Complexity of Data Reviewed  Labs: ordered.  Radiology: ordered.  ECG/medicine tests: ordered.    Risk  Prescription drug management.          Studies Assessed:  Lab work, imaging, EKG    EKG:   This EKG interpreted by me shows:    Rate: 73    Interpretation:  Normal axis, sinus rhythm, rate 73, nonspecific ST-T wave changes      MDM Narrative:    Medical  Decision Making  A 68 year old woman with a history of osteoarthritis (typically right-sided) and prior pneumonia presented with acute severe left hip and buttock pain following increased physical activity, without trauma, now unable to ambulate. She also reported a recent upper respiratory infection with persistent cough, green sputum, and wheezing, but no fever. Exam revealed left hip tenderness and wheezing on lung auscultation. She is currently on antibiotics and recently received a steroid injection for respiratory symptoms.    Differential diagnosis includes, but is not limited to:  - Exacerbation of Osteoarthritis vs. Acute Musculoskeletal Strain: Acute left hip pain and severe tenderness without trauma, in the context of known osteoarthritis (though typically right-sided), with consideration for musculoskeletal strain due to recent increased activity; no evidence of neurological deficit or trauma was elicited.  - Acute Bronchitis: Productive cough with green sputum, chest congestion, and wheezing following a recent upper respiratory infection, with no fever and a history of prior pneumonia, consistent with acute bronchitis; pneumonia is a possibility based on her history, but no obvious signs on physical examination were noted.    Acute left hip pain (osteoarthritis exacerbation vs. musculoskeletal strain)  -  Ordered x-rays of the back and hips  - Administered Robaxin , Toradol  and steroids    Acute bronchitis with wheezing  - Ordered chest x-ray and labs  - Administered breathing treatment and steroids       ED Course as of 10/25/23 1504   Wed Oct 25, 2023   1310 X-ray imaging in bilateral hips and pelvis revealed:   FINDINGS: An AP view of the pelvis and 2 views of each hip were obtained. No acute fracture Is seen. No focal soft tissue swelling is identified. Mild symmetric narrowing of the left hip joints is identified on these nonweightbearing views. Mild hypertrophic spurring of both greater trochanters is seen.        IMPRESSION:  No acute fracture of the pelvis or either hip is noted.   1313 X-ray imaging of the lumbar spine revealed:   FINDINGS:  Normal lumbar vertebral heights.  No evidence of fracture.  Disc space heights are preserved.  Hypertrophic changes in the lower facets.  Vascular calcifications.     IMPRESSION:  MILD DEGENERATIVE CHANGES OF THE LUMBAR SPINE.   1341 Chest x-ray revealed:   FINDINGS:     Heart:  The heart and mediastinum are stable.  Lungs:  The lungs are stable.  No pleural fluid.     IMPRESSION:  NO ACUTE FINDINGS   1355 WBC 12.1, hemoglobin 14.3, platelet count 276   1403 PTT 33.1, PT 11.6, INR 1.03   1411 Troponin 3   1416 Sodium 141, potassium 3.4, BUN 14, creatinine 0.79, glucose 180, total bilirubin 0.3, AST 13, ALT 20, total alkaline phosphatase 92   1440 Went back to reexamine the patient and she is actually asleep in the exam room bed.  Viral testing is pending.  We will continue to monitor at this time.   1455 COVID-19, RSV, influenza testing was negative   1503 On reexamination, patient was resting comfortably and in no apparent distress.  She states that she is feeling much better and is ready to go home.  She admits to improvement in wheezing as well as hip pain.  Anaprox  and prednisone  prescribed.  Patient was counseled and educated on proper use and most common side  effects.  Patient was counseled and educated on supportive care at home, including rest, hydration and over-the-counter medications.  She is to continue  taking her Flexeril , as prescribed.  Patient was instructed to follow up with PCP in the next 1-2 days to recheck symptoms and was given very clear instructions to return to the emergency department for any new or worsening symptoms.  Patient verbalized understanding.  Patient was smiling and stable at time of discharge.  All questions were answered to satisfaction.         Medications Ordered/Administered in the ED   ipratropium-albuterol  0.5 mg-3 mg(2.5 mg base)/3 mL Solution for Nebulization (3 mL Nebulization Given 10/25/23 1333)   budesonide  (PULMICORT  RESPULES) 0.5 mg/2 mL nebulizer suspension (1 mg Nebulization Given 10/25/23 1333)   methylPREDNISolone  sod succ (SOLU-medrol ) 125 mg/2 mL injection (62.5 mg Intravenous Given 10/25/23 1404)   ketorolac  (TORADOL ) 30 mg/mL injection (15 mg Intravenous Given 10/25/23 1403)   methocarbamol  (ROBAXIN ) tablet (1,000 mg Oral Given 10/25/23 1402)       Following the history, physical exam, and ED workup, the patient was deemed stable and suitable for discharge. The patient/caregiver was advised to return to the ED for any new or worsening symptoms. Discharge medications, and follow-up instructions were discussed with the patient/caregiver in detail, who verbalizes understanding. The patient/caregiver is in agreement and is comfortable with the plan of care.    Disposition: Discharged         Current Discharge Medication List        START taking these medications.        Details   naproxen  sodium 550 mg Tablet  Commonly known as: ANAPROX    550 mg, Oral, EVERY 12 HOURS PRN  Qty: 30 Tablet  Refills: 0     predniSONE  20 mg Tablet  Commonly known as: DELTASONE   Start taking on: October 26, 2023   40 mg, Oral, Daily  Qty: 10 Tablet  Refills: 0            CONTINUE these medications - NO CHANGES were made during your visit.         Details   * albuterol  sulfate 90 mcg/actuation oral inhaler  Commonly known as: PROVENTIL  or VENTOLIN  or PROAIR    1 Puff, Inhalation, EVERY 6 HOURS PRN  Refills: 0     * albuterol  sulfate 2.5 mg /3 mL (0.083 %) nebulizer solution  Commonly known as: PROVENTIL    2.5 mg, Nebulization, EVERY 6 HOURS PRN  Refills: 0     Amlodipine -Benazepril  10-40 mg Capsule   1 Capsule, Oral, Daily  Refills: 0     aspirin  81 mg Tablet, Delayed Release (E.C.)  Commonly known as: ECOTRIN   81 mg, Oral, Daily  Refills: 0     cetirizine 10 mg Tablet  Commonly known as: zyrTEC   10 mg, Oral, DAILY PRN  Refills: 0     cyclobenzaprine  10 mg Tablet  Commonly known as: FLEXERIL    10 mg, Oral, 3 TIMES DAILY PRN  Refills: 0     fluticasone  propionate 50 mcg/actuation Spray, Suspension  Commonly known as: FLONASE    2 Sprays, Each Nostril, Daily  Refills: 0     metoprolol  succinate 25 mg Tablet Sustained Release 24 hr  Commonly known as: TOPROL -XL   12.5 mg, Oral, Daily  Qty: 15 Tablet  Refills: 0     Mounjaro 5 mg/0.5 mL Pen Injector  Generic drug: tirzepatide   5 mg, EVERY 7 DAYS  Refills: 0     omeprazole 40 mg Capsule, Delayed Release(E.C.)  Commonly known as: PRILOSEC   40 mg, Oral, Daily  Refills: 0  Symbicort  160-4.5 mcg/actuation oral inhaler  Generic drug: budesonide -formoteroL    2 Puffs, Inhalation, 2 TIMES DAILY  Refills: 0     Synjardy 12.5-1,000 mg Tablet  Generic drug: empagliflozin-metformin   1 Tablet, Oral, 2 TIMES DAILY  Refills: 0           * This list has 2 medication(s) that are the same as other medications prescribed for you. Read the directions carefully, and ask your doctor or other care provider to review them with you.                Follow up:   [place], [place]  [id number]  [place] [address]  [phone]    In 1 day      [facility] - Emergency Department  [address]  [place]  [address]  [phone]    As needed, If symptoms worsen               Clinical  Impression   Pain in left hip (Primary)   Acute upper respiratory infection   Asthma, unspecified asthma severity, unspecified whether complicated, unspecified whether persistent   Hypertension, unspecified type   Lumbar paraspinal muscle spasm         Current Discharge Medication List        START taking these medications    Details   naproxen  sodium (ANAPROX ) 550 mg Oral Tablet Take 1 Tablet (550 mg total) by mouth Every 12 hours as needed (Pain)  Qty: 30 Tablet, Refills: 0      predniSONE  (DELTASONE ) 20 mg Oral Tablet Take 2 Tablets (40 mg total) by mouth Daily for 5 days  Qty: 10 Tablet, Refills: 0               Megan Cork, DO

## 2023-10-25 NOTE — ED Nurses Note (Signed)
 Patient's IV removed, tip intact, and site without redness. Discharge instructions, follow up appointments, prescriptions, and AVS reviewed with patient. A written copy of the AVS and discharge instructions were given to the patient. All questions answered, and patient verbalized understanding. Discharge medications sent to home pharmacy. Patient discharged per order with all of their belongings. In the event of an emergency, patient instructed to call 911 or return to emergency room.    Carmella Cho, BSN, RN

## 2023-10-25 NOTE — ED Triage Notes (Signed)
 Left hip pain into low back with congestion/cough. Seen PCP for congestion and reports isn't getting better.

## 2023-10-26 DIAGNOSIS — R9431 Abnormal electrocardiogram [ECG] [EKG]: Secondary | ICD-10-CM

## 2023-10-26 LAB — ECG 12 LEAD
Atrial Rate: 73 {beats}/min
Calculated P Axis: 72 degrees
Calculated R Axis: 53 degrees
Calculated T Axis: 38 degrees
PR Interval: 170 ms
QRS Duration: 80 ms
QT Interval: 384 ms
QTC Calculation: 423 ms
Ventricular rate: 73 {beats}/min

## 2023-11-02 ENCOUNTER — Ambulatory Visit
Admission: RE | Admit: 2023-11-02 | Discharge: 2023-11-02 | Disposition: A | Discharge status: Home / Self Care | Source: Ambulatory Visit | Attending: NURSE PRACTITIONER | Admitting: NURSE PRACTITIONER

## 2023-11-02 ENCOUNTER — Other Ambulatory Visit (HOSPITAL_COMMUNITY): Payer: Self-pay | Admitting: NURSE PRACTITIONER

## 2023-11-02 ENCOUNTER — Other Ambulatory Visit: Payer: Self-pay

## 2023-11-02 DIAGNOSIS — J40 Bronchitis, not specified as acute or chronic: Secondary | ICD-10-CM

## 2023-11-02 DIAGNOSIS — R059 Cough, unspecified: Secondary | ICD-10-CM

## 2023-11-02 DIAGNOSIS — R0602 Shortness of breath: Secondary | ICD-10-CM

## 2023-11-02 DIAGNOSIS — R0989 Other specified symptoms and signs involving the circulatory and respiratory systems: Secondary | ICD-10-CM

## 2023-11-09 ENCOUNTER — Emergency Department (HOSPITAL_COMMUNITY)

## 2023-11-09 ENCOUNTER — Emergency Department
Admission: EM | Admit: 2023-11-09 | Discharge: 2023-11-10 | Disposition: A | Discharge status: Home / Self Care | Attending: Emergency Medicine | Admitting: Emergency Medicine

## 2023-11-09 ENCOUNTER — Other Ambulatory Visit: Payer: Self-pay

## 2023-11-09 ENCOUNTER — Encounter (HOSPITAL_COMMUNITY): Payer: Self-pay | Admitting: Emergency Medicine

## 2023-11-09 DIAGNOSIS — R600 Localized edema: Secondary | ICD-10-CM | POA: Insufficient documentation

## 2023-11-09 DIAGNOSIS — T380X5A Adverse effect of glucocorticoids and synthetic analogues, initial encounter: Secondary | ICD-10-CM | POA: Insufficient documentation

## 2023-11-09 DIAGNOSIS — J45909 Unspecified asthma, uncomplicated: Secondary | ICD-10-CM | POA: Insufficient documentation

## 2023-11-09 DIAGNOSIS — R911 Solitary pulmonary nodule: Secondary | ICD-10-CM | POA: Insufficient documentation

## 2023-11-09 DIAGNOSIS — R0602 Shortness of breath: Secondary | ICD-10-CM | POA: Insufficient documentation

## 2023-11-09 DIAGNOSIS — R609 Edema, unspecified: Secondary | ICD-10-CM

## 2023-11-09 DIAGNOSIS — Z1152 Encounter for screening for COVID-19: Secondary | ICD-10-CM | POA: Insufficient documentation

## 2023-11-09 DIAGNOSIS — J9811 Atelectasis: Secondary | ICD-10-CM

## 2023-11-09 DIAGNOSIS — R001 Bradycardia, unspecified: Secondary | ICD-10-CM | POA: Insufficient documentation

## 2023-11-09 DIAGNOSIS — Z7952 Long term (current) use of systemic steroids: Secondary | ICD-10-CM | POA: Insufficient documentation

## 2023-11-09 DIAGNOSIS — Z8249 Family history of ischemic heart disease and other diseases of the circulatory system: Secondary | ICD-10-CM | POA: Insufficient documentation

## 2023-11-09 DIAGNOSIS — E1165 Type 2 diabetes mellitus with hyperglycemia: Secondary | ICD-10-CM | POA: Insufficient documentation

## 2023-11-09 DIAGNOSIS — R0789 Other chest pain: Secondary | ICD-10-CM

## 2023-11-09 DIAGNOSIS — J984 Other disorders of lung: Secondary | ICD-10-CM | POA: Insufficient documentation

## 2023-11-09 DIAGNOSIS — Z7984 Long term (current) use of oral hypoglycemic drugs: Secondary | ICD-10-CM | POA: Insufficient documentation

## 2023-11-09 DIAGNOSIS — R0609 Other forms of dyspnea: Secondary | ICD-10-CM

## 2023-11-09 DIAGNOSIS — R9431 Abnormal electrocardiogram [ECG] [EKG]: Secondary | ICD-10-CM | POA: Insufficient documentation

## 2023-11-09 DIAGNOSIS — I10 Essential (primary) hypertension: Secondary | ICD-10-CM | POA: Insufficient documentation

## 2023-11-09 LAB — COVID-19, FLU A/B, RSV RAPID BY PCR
INFLUENZA VIRUS TYPE A: NOT DETECTED
INFLUENZA VIRUS TYPE B: NOT DETECTED
RESPIRATORY SYNCTIAL VIRUS (RSV): NOT DETECTED
SARS-CoV-2: NOT DETECTED

## 2023-11-09 LAB — SCAN DIFFERENTIAL
PLATELET MORPHOLOGY COMMENT: NORMAL
RBC MORPHOLOGY COMMENT: NORMAL
SCHISTOCYTES: ABSENT

## 2023-11-09 LAB — LACTIC ACID - FIRST REFLEX: LACTIC ACID: 4.5 mmol/L (ref 0.5–2.2)

## 2023-11-09 LAB — COMPREHENSIVE METABOLIC PANEL, NON-FASTING
ALBUMIN/GLOBULIN RATIO: 1.5 — ABNORMAL HIGH (ref 0.8–1.4)
ALBUMIN: 4.4 g/dL (ref 3.5–5.7)
ALKALINE PHOSPHATASE: 81 U/L (ref 34–104)
ALT (SGPT): 20 U/L (ref 7–52)
ANION GAP: 14 mmol/L — ABNORMAL HIGH (ref 4–13)
AST (SGOT): 14 U/L (ref 13–39)
BILIRUBIN TOTAL: 0.3 mg/dL (ref 0.3–1.0)
BUN/CREA RATIO: 32 — ABNORMAL HIGH (ref 6–22)
BUN: 32 mg/dL — ABNORMAL HIGH (ref 7–25)
CALCIUM, CORRECTED: 10.2 mg/dL (ref 8.9–10.8)
CALCIUM: 10.5 mg/dL — ABNORMAL HIGH (ref 8.6–10.3)
CHLORIDE: 97 mmol/L — ABNORMAL LOW (ref 98–107)
CO2 TOTAL: 26 mmol/L (ref 21–31)
CREATININE: 1.01 mg/dL (ref 0.60–1.30)
ESTIMATED GFR: 61 mL/min/1.73mˆ2 (ref >59)
GLOBULIN: 3 (ref 2.0–3.5)
GLUCOSE: 402 mg/dL (ref 74–109)
OSMOLALITY, CALCULATED: 298 mosm/kg — ABNORMAL HIGH (ref 270–290)
POTASSIUM: 4.4 mmol/L (ref 3.5–5.1)
PROTEIN TOTAL: 7.4 g/dL (ref 6.4–8.9)
SODIUM: 137 mmol/L (ref 136–145)

## 2023-11-09 LAB — BLOOD GAS W/ LACTATE REFLEX
%FIO2 (ARTERIAL): 21 %
BASE EXCESS (ARTERIAL): 2.8 mmol/L (ref 0.0–3.0)
BICARBONATE (ARTERIAL): 27 mmol/L (ref 21.0–28.0)
LACTATE: 4.5 mmol/L (ref <=1.9)
O2 SATURATION (ARTERIAL): 94.7 % (ref 94.0–98.0)
PAO2/FIO2 RATIO: 338
PCO2 (ARTERIAL): 40 mmHg (ref 35–45)
PH (ARTERIAL): 7.44 (ref 7.35–7.45)
PO2 (ARTERIAL): 71 mmHg — ABNORMAL LOW (ref 83–108)

## 2023-11-09 LAB — CBC WITH DIFF
BASOPHIL #: 0.1 x10ˆ3/uL (ref 0.00–0.10)
BASOPHIL %: 1 % (ref 0–1)
EOSINOPHIL #: 0 x10ˆ3/uL (ref 0.00–0.50)
EOSINOPHIL %: 0 % — ABNORMAL LOW (ref 1–7)
HCT: 45.5 % — ABNORMAL HIGH (ref 31.2–41.9)
HGB: 15.3 g/dL — ABNORMAL HIGH (ref 10.9–14.3)
LYMPHOCYTE #: 2.4 x10ˆ3/uL (ref 1.10–3.10)
LYMPHOCYTE %: 15 % — ABNORMAL LOW (ref 16–46)
MCH: 28.7 pg (ref 24.7–32.8)
MCHC: 33.5 g/dL (ref 32.3–35.6)
MCV: 85.6 fL (ref 75.5–95.3)
MONOCYTE #: 0.9 x10ˆ3/uL (ref 0.20–0.90)
MONOCYTE %: 6 % (ref 4–11)
MPV: 9.6 fL (ref 7.9–10.8)
NEUTROPHIL #: 13.1 x10ˆ3/uL — ABNORMAL HIGH (ref 1.90–8.20)
NEUTROPHIL %: 79 % — ABNORMAL HIGH (ref 43–77)
PLATELETS: 313 x10ˆ3/uL (ref 140–440)
RBC: 5.32 x10ˆ6/uL — ABNORMAL HIGH (ref 3.63–4.92)
RDW: 14.3 % (ref 12.3–17.7)
WBC: 16.6 x10ˆ3/uL — ABNORMAL HIGH (ref 3.8–11.8)

## 2023-11-09 LAB — B-TYPE NATRIURETIC PEPTIDE (BNP),PLASMA: BNP: 35 pg/mL (ref 1–100)

## 2023-11-09 LAB — MAGNESIUM: MAGNESIUM: 1.9 mg/dL (ref 1.9–2.7)

## 2023-11-09 LAB — LACTIC ACID LEVEL W/ REFLEX FOR LEVEL >2.0: LACTIC ACID: 3.1 mmol/L — ABNORMAL HIGH (ref 0.5–2.2)

## 2023-11-09 LAB — TROPONIN-I: TROPONIN I: 4 ng/L (ref <15)

## 2023-11-09 LAB — BETA-HYDROXYBUTYRATE: BETA-HYDROXYBUTYRATE: 0.24 mmol/L (ref 0.02–0.27)

## 2023-11-09 LAB — LACTIC ACID - SECOND REFLEX: LACTIC ACID: 4.3 mmol/L (ref 0.5–2.2)

## 2023-11-09 MED ORDER — METHYLPREDNISOLONE SOD SUCC 125 MG SOLUTION FOR INJECTION WRAPPER
125.0000 mg | INTRAVENOUS | Status: DC
Start: 2023-11-09 — End: 2023-11-09

## 2023-11-09 MED ORDER — IPRATROPIUM 0.5 MG-ALBUTEROL 3 MG (2.5 MG BASE)/3 ML NEBULIZATION SOLN
3.0000 mL | INHALATION_SOLUTION | RESPIRATORY_TRACT | Status: AC
Start: 2023-11-09 — End: 2023-11-09
  Administered 2023-11-09: 3 mL via RESPIRATORY_TRACT

## 2023-11-09 MED ORDER — IOPAMIDOL 370 MG IODINE/ML (76 %) INTRAVENOUS SOLUTION
75.0000 mL | INTRAVENOUS | Status: AC
Start: 2023-11-09 — End: 2023-11-09
  Administered 2023-11-09: 75 mL via INTRAVENOUS

## 2023-11-09 MED ORDER — SODIUM CHLORIDE 0.9 % INTRAVENOUS SOLUTION
Freq: Once | INTRAVENOUS | Status: AC
Start: 2023-11-10 — End: 2023-11-10
  Administered 2023-11-10: 0 mL via INTRAVENOUS

## 2023-11-09 NOTE — ED Provider Notes (Addendum)
 [facility]  ED Primary Provider Note        Arrival: The patient arrived by Car     History of Present Illness   chief complaint  [name] [name] is a 68 y.o. female who had concerns including Shortness of Breath.  Blood pressure 165/64 with a heart rate of 75, respiratory rate of 22 with a oxygen saturation 95% on room air.  Afebrile at 96.4    History of Present Illness  [name] [name] is a 68 year old female with diabetes and hypertension who presents with dyspnea on exertion.    Dyspnea on exertion  - Significant shortness of breath during physical activities, worsening over the past two weeks  - Dyspnea described as 'smothering'  - Improves with rest    Lower extremity edema  - Leg swelling present for over a week  - Edema severe enough to prevent wearing shoes  - Lasix reduced swelling  - Weight decreased from 193 to 189 pounds over a weekend while on Lasix    Respiratory symptoms  - History of recurrent bronchitis  - Recent nasal and chest congestion lasting two to three weeks  - Received Rocephin  and oral antibiotics with initial improvement  - Continued production of yellow sputum until recently  - Cough has resolved  - Breathing treatments and inhalers have not been effective recently    Glycemic control  - Diabetes managed with metformin  - Elevated blood sugar levels    Cardiovascular risk factors  - Hypertension managed with medication  - No known heart problems  - Family history of congestive heart failure    Tobacco use  - No smoking or vaping    Review of Systems     No other overt Review of Systems are noted to be positive except noted in the HPI.      Historical Data   History Reviewed This Encounter: Medical History  Surgical History  Family History  Social History      Physical Exam   ED Triage Vitals [11/09/23 1824]   BP (Non-Invasive) (!) 165/64   Heart Rate 75   Respiratory Rate (!) 22   Temperature (!) 35.8 C (96.4 F)   SpO2 95 %   Weight 85.7 kg (189  lb)   Height 1.615 m (5' 3.6)         Exam:   Constitutional:  Patient alert orient x3 in no apparent distress.  Obese.  No limitations.  Head: Atraumatic normocephalic  Eyes :  Pupils are equal round reactive to light and accommodation extraocular muscles are intact.  Sclera and conjunctiva are unremarkable  Ears:  Tympanic membranes are pearly gray bilaterally; external auditory canals are unremarkable; external ears without any lesions  Nose:  Nares are patent turbinates are pink and moist  Mouth:  Mucosa is pink and moist without lesions.  Posterior pharynx is pink and moist without hypertrophy/exudate.  Neck:  Soft and supple without palpable lymphadenopathy.  Heart:  Regular rate and rhythm with a 1/6 holosystolic ejection murmur.  Lungs:  Clear to auscultation bilaterally without any wheezing/rales/rhonchi  Abdomen:  Soft nontender without any rebound or guarding; positive bowel sounds throughout  Genitalia:  Deferred  Skin:  Warm and dry without lesions.  Normal skin turgor.  Brisk capillary refill distally  Extremities:  Good strength bilaterally with full range of motion of upper and lower extremities.  1+ nonpitting edema to bilateral lower extremity  Neuro:  Alert oriented x3.  Cranial nerves II-XII grossly intact as tested.  Excellent sensation distally over all dermatomes.  Psychiatric:  Patient cooperative, affect appropriate, insight and judgment good          Procedures           Patient Data     Labs Ordered/Reviewed   BLOOD GAS W/ LACTATE REFLEX - Abnormal; Notable for the following components:       Result Value    PO2 (ARTERIAL) 71 (*)     LACTATE 4.5 (*)     All other components within normal limits    Narrative:     A reference range for Oxygen Saturation is provided but abnormal results will not flag in Epic.   COMPREHENSIVE METABOLIC PANEL, NON-FASTING - Abnormal; Notable for the following components:    CHLORIDE 97 (*)     ANION GAP 14 (*)     BUN 32 (*)     BUN/CREA RATIO 32 (*)      CALCIUM 10.5 (*)     GLUCOSE 402 (*)     ALBUMIN/GLOBULIN RATIO 1.5 (*)     OSMOLALITY, CALCULATED 298 (*)     All other components within normal limits    Narrative:     Estimated Glomerular Filtration Rate (eGFR) is calculated using the CKD-EPI (2021) equation, intended for patients 57 years of age and older. If gender is not documented or unknown, there will be no eGFR calculation.     CBC WITH DIFF - Abnormal; Notable for the following components:    WBC 16.6 (*)     RBC 5.32 (*)     HGB 15.3 (*)     HCT 45.5 (*)     NEUTROPHIL % 79 (*)     LYMPHOCYTE % 15 (*)     EOSINOPHIL % 0 (*)     NEUTROPHIL # 13.10 (*)     All other components within normal limits   LACTIC ACID LEVEL W/ REFLEX FOR LEVEL >2.0 - Abnormal; Notable for the following components:    LACTIC ACID 3.1 (*)     All other components within normal limits   LACTIC ACID - FIRST REFLEX - Abnormal; Notable for the following components:    LACTIC ACID 4.5 (*)     All other components within normal limits    Narrative:     Slightly lipemic specimen     LACTIC ACID - SECOND REFLEX - Abnormal; Notable for the following components:    LACTIC ACID 4.3 (*)     All other components within normal limits    Narrative:     Moderately hemolyzed specimen  Slightly lipemic specimen     LACTIC ACID LEVEL W/ REFLEX FOR LEVEL >2.0 - Abnormal; Notable for the following components:    LACTIC ACID 3.0 (*)     All other components within normal limits   POC BLOOD GLUCOSE (RESULTS) - Abnormal; Notable for the following components:    GLUCOSE, POC 416 (*)     All other components within normal limits   POC BLOOD GLUCOSE (RESULTS) - Abnormal; Notable for the following components:    GLUCOSE, POC 305 (*)     All other components within normal limits   B-TYPE NATRIURETIC PEPTIDE (BNP),PLASMA - Normal    Narrative:                                 Class 1: 101-250 pg/mL  Class 2: 251-550 pg/mL                              Class 3: 551-900 pg/mL                               Class 4: >901 pg/mL     The [place]  [employer] has developed a four-stage functional classification system for CHF that is based on a subjective interpretation of the severity of a patient's clinical signs and symptoms.    Class 1 - Patients have no limitations on physical activity and have no symptoms with ordinary physical activity.    Class 2 - Patients have a slight limitation of physical activity and have symptoms with ordinary physical activity.    Class 3 - Patients have a marked limitation of physical activity and have symptoms with less than ordinary physical activity, but not at rest.    Class 4 - Patients are unable to perform any physical activity without discomfort.   COVID-19, FLU A/B, RSV RAPID BY PCR - Normal    Narrative:     Results are for the simultaneous qualitative identification of SARS-CoV-2 (formerly 2019-nCoV), Influenza A, Influenza B, and RSV RNA. These etiologic agents are generally detectable in nasopharyngeal and nasal swabs during the ACUTE PHASE of infection. Hence, this test is intended to be performed on respiratory specimens collected from individuals with signs and symptoms of upper respiratory tract infection who meet Centers for Disease Control and Prevention (CDC) clinical and/or epidemiological criteria for Coronavirus Disease 2019 (COVID-19) testing. CDC COVID-19 criteria for testing on human specimens is available at [facility] webpage information for Healthcare Professionals: Coronavirus Disease 2019 (COVID-19) ([email or web address]).     False-negative results may occur if the virus has genomic mutations, insertions, deletions, or rearrangements or if performed very early in the course of illness. Otherwise, negative results indicate virus specific RNA targets are not detected, however negative results do not preclude SARS-CoV-2 infection/COVID-19, Influenza, or Respiratory syncytial virus infection. Results  should not be used as the sole basis for patient management decisions. Negative results must be combined with clinical observations, patient history, and epidemiological information. If upper respiratory tract infection is still suspected based on exposure history together with other clinical findings, re-testing should be considered.    Test methodology:   Cepheid Xpert Xpress SARS-CoV-2/Flu/RSV Assay real-time polymerase chain reaction (RT-PCR) test on the GeneXpert Dx and Xpert Xpress systems.   MAGNESIUM  - Normal   TROPONIN-I - Normal   BETA-HYDROXYBUTYRATE - Normal   ADULT ROUTINE BLOOD CULTURE, SET OF 2 BOTTLES (BACTERIA AND YEAST)   ADULT ROUTINE BLOOD CULTURE, SET OF 2 BOTTLES (BACTERIA AND YEAST)   CBC/DIFF    Narrative:     The following orders were created for panel order CBC/DIFF.  Procedure                               Abnormality         Status                     ---------                               -----------         ------  CBC WITH IPQQ[[id number]]                Abnormal            Final result                 Please view results for these tests on the individual orders.   SCAN DIFFERENTIAL   EXTRA TUBES    Narrative:     The following orders were created for panel order EXTRA TUBES.  Procedure                               Abnormality         Status                     ---------                               -----------         ------                     LIGHT GREEN TOP ULAZ[[id number]]                             In process                 LAVENDER TOP TUBE[[id number]]                                In process                   Please view results for these tests on the individual orders.   LIGHT GREEN TOP TUBE   LAVENDER TOP TUBE   LACTIC ACID - FIRST REFLEX   PERFORM POC WHOLE BLOOD GLUCOSE   PERFORM POC WHOLE BLOOD GLUCOSE       CT CHEST FOR PULMONARY EMBOLUS W IV CONTRAST   Final Result by Edi, Radresults In (10/02 2043)   No CTA evidence of pulmonary embolus.      Areas  of heterogeneous pulmonary parenchymal attenuation bilaterally. The areas of decreased attenuation can be seen with areas of air trapping which can be secondary to small airway disease.      8.5 mm mildly lobulated noncalcified nodule within the upper lobe the right lung which has increased in size compared to 02/04/2021. This is indeterminate in etiology. Given the interval increase in size, evaluation with PET/CT on an outpatient basis May BE considered for further evaluation. If PET CT is not performed, at least CT follow-up should be performed for reevaluation.      Stable approximately 1.7 cm somewhat elongated mixed solid/groundglass attenuation nodular density within the left upper lobe which is nonspecific in etiology. This may also BE further evaluated with PET CT      Other nonacute findings as described above.                  Radiologist location ID: [id number]         XR CHEST PA AND LATERAL   Final Result by Edi, Radresults In (10/02 1933)   NO ACUTE FINDINGS.               Radiologist location ID: [id number]  Medical Decision Making          Medical Decision Making  Amount and/or Complexity of Data Reviewed  Radiology: ordered and independent interpretation performed.  ECG/medicine tests: ordered and independent interpretation performed.    Risk  OTC drugs.  Prescription drug management.    Critical Care  Total time providing critical care: 0 minutes        Medical Decision Making  A 68 year old woman with a history of diabetes, hypertension, and recurrent bronchitis presented with several weeks of worsening exertional dyspnea, lower extremity edema, and recent weight loss following Lasix use. She reported prior productive cough and yellow sputum, now resolved, and has received multiple courses of antibiotics and steroids. Exam and labs revealed elevated blood glucose, mild dehydration, leukocytosis attributed to steroids, and a rising lactic acid level likely related to metformin and  hyperglycemia. Chest CT showed a stable but slightly enlarging right upper lobe pulmonary nodule without evidence of pneumonia or acute infiltrate.    Differential diagnosis includes, but is not limited to:  - Congestive Heart Failure and Volume Overload: Considered due to dyspnea, lower extremity edema, and family history, but no acute decompensation or critical findings on imaging or labs; managed with Lasix and observation.  - Steroid-Induced Hyperglycemia and Metformin-Related Lactic Acidosis: Elevated blood glucose and lactic acid attributed to recent steroid use and metformin, with no evidence of sepsis or acute infection.  - Recurrent Bronchitis/Viral Upper Respiratory Infection: Recent productive cough and congestion now resolved, with no evidence of bacterial infection or pneumonia on imaging; current symptoms likely post-viral or related to chronic airway disease.  - Pulmonary Nodule (Malignancy vs. Benign Etiology): Right upper lobe nodule has increased in size since prior imaging, requiring outpatient PET CT for further evaluation; no acute findings on current CT.  Patient elevated lactic acid may be attributable to her being on it metformin but also is consistent with the patient's recent multiple steroid injections/dosing resulting in gluconeogenesis.  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 ALONSO Blanch for pulmonology follow-up    ED Course as of 11/10/23 0442   Thu Nov 09, 2023   1931 EKG shows normal sinus rhythm with a heart rate of 56, normal axis, late R-wave progression, no ectopy, no ischemia, normal PR/QRS   2035 White count 16.6 with a hemoglobin of 15.3, hematocrit of 45.5, platelets of 313.  Left shift of 79% neutrophils.   2035 Chloride 97 with a gap of 14, BUN of 32 with a creatinine of 1.01.  Calcium is 10.5 with a glucose of 402.  Calculated osmolality 298.   2036 Mag 1.9, troponin 4, BNP 35.  Lactic 3.1.  Hydroxybutyrate 0.24.   2036 COVID/flu/RSV are negative   2036 Arterial blood gas shows a pH of 7.44 with a pCO2 of 40 PaO2 of 71 with an  O2 sat of 94.7 on room air with a lactic of 4.5   2036 Single portable view chest shows no acute infiltrate, no acute disease process   2045 CTA of chest for PE:   IMPRESSION:  No CTA evidence of pulmonary embolus.     Areas of heterogeneous pulmonary parenchymal attenuation bilaterally. The areas of decreased attenuation can be seen with areas of air trapping which can be secondary to small airway disease.     8.5 mm mildly lobulated noncalcified nodule within the upper lobe the right lung which has increased in size compared to 02/04/2021. This is indeterminate in etiology. Given the interval increase in size, evaluation with PET/CT on an outpatient basis May BE considered for further evaluation. If PET CT is not performed, at least CT follow-up should be performed for reevaluation.     Stable approximately 1.7 cm somewhat elongated mixed solid/groundglass attenuation nodular density within the left upper lobe which is nonspecific in etiology. This may also BE further evaluated with PET CT     2046 Two-view chest shows no acute infiltrate, no acute disease process   Fri Nov 10, 2023   0423 Repeat lactic 3.0   0430 Repeat glucose 307.  We will discharge patient home.       Results  LABS  Lactic Acid: 4.5 (11/09/2023)  BUN:  32 (11/09/2023)  Creatinine: 1.01 (11/09/2023)  BUN/Creatinine Ratio: 32 (11/09/2023)  Glucose: 402 (11/09/2023)  Troponin: 4 (11/09/2023)  BNP: 35 (11/09/2023)  Magnesium : 1.9 (11/09/2023)  WBC: 16.6 (11/09/2023)  Hb: 15.3 (11/09/2023)  Hematocrit: 45.5 (11/09/2023)  PLT: 313 (11/09/2023)  Neutrophils: 79% (11/09/2023)  Absolute Neutrophil Count: 13.1 (11/09/2023)  Chloride: 97 (11/09/2023)  Anion Gap: 14 (11/09/2023)  Calculated Osmolality: 298 (11/09/2023)  pH: 7.44 (11/09/2023)  PCO2: 40 (11/09/2023)  PAO2: 71 (11/09/2023)  O2 Sat: 94.7 (11/09/2023)  COVID/Flu/RSV: Negative (11/09/2023)    RADIOLOGY  CT Chest: Pulmonary nodule 8.5 mm, no pneumonia, no pneumothorax, no infiltrate, air trapping present    Medications Ordered/Administered in the ED   ipratropium-albuterol  0.5 mg-3 mg(2.5 mg base)/3 mL Solution for Nebulization (3 mL Nebulization Given 11/09/23 1928)   iopamidol (ISOVUE-370) 76% infusion (75 mL Intravenous Given 11/09/23 2026)   NS premix infusion ( Intravenous New Bag/New Syringe 11/09/23 2249)   insulin  R human 100 units/mL injection (10 Units Subcutaneous Given 11/10/23 0330)       Following the history, physical exam, and ED workup, the patient was deemed stable and suitable for discharge. The patient/caregiver was advised to return to the ED for any new or worsening symptoms. Discharge medications, and follow-up instructions were discussed with the patient/caregiver in detail, who verbalizes understanding. The patient/caregiver is in agreement and is comfortable with the plan of care.    Disposition: Discharged         Current Discharge Medication List        CONTINUE these medications - NO CHANGES were made during your visit.        Details   * albuterol  sulfate 90 mcg/actuation oral inhaler  Commonly known as: PROVENTIL  or VENTOLIN  or PROAIR    1 Puff, Inhalation, EVERY 6 HOURS PRN  Refills: 0     * albuterol  sulfate 2.5 mg /3 mL (0.083 %) nebulizer solution  Commonly known as: PROVENTIL    2.5 mg,  Nebulization, EVERY 6 HOURS PRN  Refills: 0     Amlodipine -Benazepril  10-40 mg Capsule   1 Capsule, Oral, Daily  Refills: 0     aspirin  81 mg Tablet,  Delayed Release (E.C.)  Commonly known as: ECOTRIN   81 mg, Oral, Daily  Refills: 0     cetirizine 10 mg Tablet  Commonly known as: zyrTEC   10 mg, Oral, DAILY PRN  Refills: 0     cyclobenzaprine  10 mg Tablet  Commonly known as: FLEXERIL    10 mg, Oral, 3 TIMES DAILY PRN  Refills: 0     fluticasone  propionate 50 mcg/actuation Spray, Suspension  Commonly known as: FLONASE    2 Sprays, Each Nostril, Daily  Refills: 0     metoprolol  succinate 25 mg Tablet Sustained Release 24 hr  Commonly known as: TOPROL -XL   12.5 mg, Oral, Daily  Qty: 15 Tablet  Refills: 0     Mounjaro 5 mg/0.5 mL Pen Injector  Generic drug: tirzepatide   5 mg, EVERY 7 DAYS  Refills: 0     naproxen  sodium 550 mg Tablet  Commonly known as: ANAPROX    550 mg, Oral, EVERY 12 HOURS PRN  Qty: 30 Tablet  Refills: 0     omeprazole 40 mg Capsule, Delayed Release(E.C.)  Commonly known as: PRILOSEC   40 mg, Oral, Daily  Refills: 0     Symbicort  160-4.5 mcg/actuation oral inhaler  Generic drug: budesonide -formoteroL    2 Puffs, Inhalation, 2 TIMES DAILY  Refills: 0     Synjardy 12.5-1,000 mg Tablet  Generic drug: empagliflozin-metformin   1 Tablet, Oral, 2 TIMES DAILY  Refills: 0           * This list has 2 medication(s) that are the same as other medications prescribed for you. Read the directions carefully, and ask your doctor or other care provider to review them with you.                ASK your doctor about these medications.        Details   predniSONE  20 mg Tablet  Commonly known as: DELTASONE   Ask about: Should I take this medication?   40 mg, Oral, Daily  Qty: 10 Tablet  Refills: 0            Follow up:   [place]  [address]  [place] [address]  [phone]      Follow up with the primary care provider for recheck on Monday/Tuesday    Tobie Beauvais, MD  [address]  [place]  [address]  [phone]      Call office to schedule follow up appointment for recurrent bronchitis                   Clinical Impression   Dyspnea on exertion (Primary)   Bilateral lower extremity edema   Uncontrolled type 2 diabetes mellitus with hyperglycemia (CMS HCC)   Hypertension, unspecified type - essential   Current use of steroid medication   Pulmonary nodule         Current Discharge Medication List          R.A. July Linam, DO  Department of Emergency Medicine

## 2023-11-09 NOTE — Respiratory Therapy (Signed)
 Latest Reference Range & Units 11/09/23 19:06   %FIO2 (ARTERIAL) % 21   PH 7.35 - 7.45  7.44   PCO2 35 - 45 mm/Hg 40   PO2 83 - 108 mm/Hg 71 (L)   BICARBONATE 21.0 - 28.0 mmol/L 27.0   BASE EXCESS 0.0 - 3.0 mmol/L 2.8   PAO2/FIO2 RATIO  338   O2 SATURATION (ARTERIAL) 94.0 - 98.0 % 94.7   LACTATE <=1.9 mmol/L 4.5 (HH)   (HH): Data is critically high  (L): Data is abnormally low  Critical lab lac results handed to attending

## 2023-11-09 NOTE — ED Triage Notes (Signed)
 Increase sob hx asthma and bronchitis . State been seen several times but getting worse with edema to legs .

## 2023-11-09 NOTE — Respiratory Therapy (Signed)
 11/09/23 1928   Aerosol Therapy (SVN)   Start Time 1928   Treatment Status Given   Route (Aerosol Therapy) with oxygen;mouth piece   $ Respiratory Treatment (Resp only) Neb (Initial)      Medications DuoNeb (albuterol -atrovent)   Signs of Intolerance (SVN) none   Stop Time 1934   Duration (minutes) 6 minutes   Patient Position (SVN) HOB elevated   Respiratory Pre/Post-Treatment Assess   Pre-Treatment Heart Rate (beats/min) 61   Pre-Treatment Resp Rate (breaths/min) 21   Post-treatment Heart Rate (beats/min) 61   Post-treatment Resp Rate (breaths/min) 18   Device (Oxygen Therapy) room air     Pt tolerated treatment well

## 2023-11-09 NOTE — ED Triage Notes (Signed)
 [facility] - Emergency Department   Physician/APP in Triage Note  Medical Screening Exam     Date and Time of Assessment: 11/09/2023 18:24     Chief Complaint   Patient presents with    Shortness of Breath       Brief HPI:  Patient reports shortness of breath that has worsened over the last several days.  She has been seen by ER x2 and PCP x2.  Recently started on Lasix  due to bilateral lower extremity swelling.  She feels like she is smothering.  Patient was also been treated with prednisone , inhalers, and 2 runs of antibiotics.    Focused Physical Exam:   ED Triage Vitals [11/09/23 1824]   BP (Non-Invasive) (!) 165/64   Heart Rate 75   Respiratory Rate (!) 22   Temperature (!) 35.8 C (96.4 F)   SpO2 95 %   Weight 85.7 kg (189 lb)   Height 1.615 m (5' 3.6)       Gen: Nontoxic in appearance.  Psych: Patient does have capacity.  Head: NC/AT  Neck: Trachea midline. No gross meningismus.    Assessment: Medical Screening Exam.    I have considered the patient's social determinants of health and health care literacy while determining the patient's initial plan of care.  I have further advised that the patient will possibly see a 2nd emergency provider today during their stay to help promote throughput.    I have performed an initial medical screening evaluation in order to determine if the patient has an emergency medical condition. The patient has been evaluated.  Imminent life, limb, or sight threatening emergency will be taken to the treatment area immediately. The patient's orders will be reviewed by nursing staff and placed in the treatment area as soon as possible as bed capacity allows.  Should the patient's status change, or a new sign/symptom develop, immediate notification of nursing or ED staff has been advised.      Neville JINNY Benders, APRN, CNP  11/09/2023 22:15        Preliminary Plan:  Labs ordered  EKG ordered  Imaging ordered  Patient will return to waiting room

## 2023-11-09 NOTE — Respiratory Therapy (Signed)
 Latest Reference Range & Units 11/09/23 19:06   %FIO2 (ARTERIAL) % 21   PH 7.35 - 7.45  7.44   PCO2 35 - 45 mm/Hg 40   PO2 83 - 108 mm/Hg 71 (L)   BICARBONATE 21.0 - 28.0 mmol/L 27.0   BASE EXCESS 0.0 - 3.0 mmol/L 2.8   PAO2/FIO2 RATIO  338   O2 SATURATION (ARTERIAL) 94.0 - 98.0 % 94.7   LACTATE <=1.9 mmol/L 4.5 (HH)   (HH): Data is critically high  (L): Data is abnormally low  Critical lab lactate. Attending with another patient so i notified np

## 2023-11-10 DIAGNOSIS — R9431 Abnormal electrocardiogram [ECG] [EKG]: Secondary | ICD-10-CM

## 2023-11-10 LAB — ECG 12 LEAD
Atrial Rate: 56 {beats}/min
Atrial Rate: 61 {beats}/min
Calculated P Axis: 65 degrees
Calculated P Axis: 69 degrees
Calculated R Axis: 22 degrees
Calculated R Axis: 8 degrees
Calculated T Axis: 25 degrees
Calculated T Axis: 47 degrees
PR Interval: 144 ms
PR Interval: 146 ms
QRS Duration: 72 ms
QRS Duration: 90 ms
QT Interval: 400 ms
QT Interval: 400 ms
QTC Calculation: 386 ms
QTC Calculation: 402 ms
Ventricular rate: 56 {beats}/min
Ventricular rate: 61 {beats}/min

## 2023-11-10 LAB — POC BLOOD GLUCOSE (RESULTS)
GLUCOSE, POC: 416 mg/dL — ABNORMAL HIGH (ref 70–100)
GLUCOSE, POC: 305 mg/dL — ABNORMAL HIGH (ref 70–100)

## 2023-11-10 LAB — LACTIC ACID LEVEL W/ REFLEX FOR LEVEL >2.0: LACTIC ACID: 3 mmol/L — ABNORMAL HIGH (ref 0.5–2.2)

## 2023-11-10 MED ORDER — INSULIN REGULAR HUMAN 100 UNIT/ML INJECTION - CHARGE BY DOSE
10.0000 [IU] | INTRAMUSCULAR | Status: AC
Start: 2023-11-10 — End: 2023-11-10
  Administered 2023-11-10: 10 [IU] via SUBCUTANEOUS

## 2023-11-10 MED ORDER — INSULIN REGULAR HUMAN 100 UNIT/ML INJ FOR MIXTURES -RX ADMIN UNIT ROUNDS TO NEAREST 0.01 ML
INTRAMUSCULAR | Status: AC
Start: 2023-11-10 — End: 2023-11-10
  Filled 2023-11-10: qty 9

## 2023-11-10 MED ORDER — INSULIN REGULAR HUMAN 100 UNIT/ML INJ FOR MIXTURES -RX ADMIN UNIT ROUNDS TO NEAREST 0.01 ML
INTRAMUSCULAR | Status: AC
Start: 2023-11-10 — End: 2023-11-10
  Filled 2023-11-10: qty 1

## 2023-11-10 NOTE — Discharge Instructions (Addendum)
 Elevate legs as much as possible when seated  Continue to use albuterol  inhaler 2 puffs every 4-6 hours as needed for any shortness a breath or wheezing  It is recommended you avoid steroids at this time  Take all regular medications as prescribed  Follow up with Dr. LULLA Blanch (pulmonologist) call office to follow up for recurrent bronchitis  Follow up with the primary care provider for recheck next week.  Be sure to discuss pulmonary nodule and possible need for PET scan.  Avoid concentrated sugars/starches  Return to emergency room for any chest pain, worsening symptoms, or any concerns

## 2023-11-10 NOTE — ED Nurses Note (Signed)
 Patient discharged home with family. AVS reviewed with patient. A written copy of the AVS and discharge instructions were given to the patient. Questions sufficiently answered as needed. Patient encouraged to follow up with PCP as indicated. In the event of an emergency, patient instructed to call 911 or go to the nearest emergency room. Patient left department via ambulation.

## 2023-11-14 LAB — ADULT ROUTINE BLOOD CULTURE, SET OF 2 BOTTLES (BACTERIA AND YEAST)
BLOOD CULTURE, ROUTINE: NO GROWTH
BLOOD CULTURE, ROUTINE: NO GROWTH

## 2023-11-15 ENCOUNTER — Emergency Department (HOSPITAL_COMMUNITY)

## 2023-11-15 ENCOUNTER — Other Ambulatory Visit: Payer: Self-pay

## 2023-11-15 ENCOUNTER — Inpatient Hospital Stay
Admission: EM | Admit: 2023-11-15 | Discharge: 2023-11-21 | DRG: 189 | Disposition: A | Discharge status: Home / Self Care | Attending: Internal Medicine | Admitting: Internal Medicine

## 2023-11-15 DIAGNOSIS — R600 Localized edema: Secondary | ICD-10-CM | POA: Diagnosis present

## 2023-11-15 DIAGNOSIS — R002 Palpitations: Secondary | ICD-10-CM | POA: Diagnosis present

## 2023-11-15 DIAGNOSIS — R609 Edema, unspecified: Secondary | ICD-10-CM

## 2023-11-15 DIAGNOSIS — Z7982 Long term (current) use of aspirin: Secondary | ICD-10-CM

## 2023-11-15 DIAGNOSIS — I272 Pulmonary hypertension, unspecified: Secondary | ICD-10-CM | POA: Diagnosis present

## 2023-11-15 DIAGNOSIS — M7989 Other specified soft tissue disorders: Secondary | ICD-10-CM | POA: Diagnosis present

## 2023-11-15 DIAGNOSIS — I119 Hypertensive heart disease without heart failure: Secondary | ICD-10-CM | POA: Diagnosis present

## 2023-11-15 DIAGNOSIS — Z794 Long term (current) use of insulin: Secondary | ICD-10-CM

## 2023-11-15 DIAGNOSIS — R739 Hyperglycemia, unspecified: Principal | ICD-10-CM

## 2023-11-15 DIAGNOSIS — J441 Chronic obstructive pulmonary disease with (acute) exacerbation: Secondary | ICD-10-CM | POA: Diagnosis present

## 2023-11-15 DIAGNOSIS — R0683 Snoring: Secondary | ICD-10-CM | POA: Insufficient documentation

## 2023-11-15 DIAGNOSIS — E872 Acidosis, unspecified: Secondary | ICD-10-CM | POA: Diagnosis present

## 2023-11-15 DIAGNOSIS — J45909 Unspecified asthma, uncomplicated: Secondary | ICD-10-CM

## 2023-11-15 DIAGNOSIS — R0602 Shortness of breath: Secondary | ICD-10-CM | POA: Diagnosis present

## 2023-11-15 DIAGNOSIS — K219 Gastro-esophageal reflux disease without esophagitis: Secondary | ICD-10-CM | POA: Diagnosis present

## 2023-11-15 DIAGNOSIS — R918 Other nonspecific abnormal finding of lung field: Secondary | ICD-10-CM

## 2023-11-15 DIAGNOSIS — R0600 Dyspnea, unspecified: Secondary | ICD-10-CM

## 2023-11-15 DIAGNOSIS — J42 Unspecified chronic bronchitis: Secondary | ICD-10-CM | POA: Insufficient documentation

## 2023-11-15 DIAGNOSIS — I10 Essential (primary) hypertension: Secondary | ICD-10-CM | POA: Insufficient documentation

## 2023-11-15 DIAGNOSIS — J9601 Acute respiratory failure with hypoxia: Principal | ICD-10-CM | POA: Diagnosis present

## 2023-11-15 DIAGNOSIS — E119 Type 2 diabetes mellitus without complications: Secondary | ICD-10-CM | POA: Insufficient documentation

## 2023-11-15 DIAGNOSIS — Z87891 Personal history of nicotine dependence: Secondary | ICD-10-CM

## 2023-11-15 DIAGNOSIS — Z79899 Other long term (current) drug therapy: Secondary | ICD-10-CM

## 2023-11-15 DIAGNOSIS — R0609 Other forms of dyspnea: Secondary | ICD-10-CM

## 2023-11-15 DIAGNOSIS — E874 Mixed disorder of acid-base balance: Secondary | ICD-10-CM

## 2023-11-15 DIAGNOSIS — R42 Dizziness and giddiness: Secondary | ICD-10-CM | POA: Diagnosis present

## 2023-11-15 DIAGNOSIS — R001 Bradycardia, unspecified: Secondary | ICD-10-CM | POA: Diagnosis present

## 2023-11-15 DIAGNOSIS — Z7951 Long term (current) use of inhaled steroids: Secondary | ICD-10-CM

## 2023-11-15 DIAGNOSIS — R911 Solitary pulmonary nodule: Secondary | ICD-10-CM | POA: Diagnosis present

## 2023-11-15 DIAGNOSIS — E1165 Type 2 diabetes mellitus with hyperglycemia: Secondary | ICD-10-CM | POA: Diagnosis present

## 2023-11-15 DIAGNOSIS — G4730 Sleep apnea, unspecified: Secondary | ICD-10-CM | POA: Diagnosis present

## 2023-11-15 LAB — TROPONIN-T
TROPONIN-T: 9 ng/L (ref <=14)
TROPONIN-T: 9 ng/L (ref <=14)

## 2023-11-15 LAB — CBC WITH DIFF
BASOPHIL #: <0.1 x10ˆ3/uL (ref <=0.20)
BASOPHIL %: 0.3 %
EOSINOPHIL #: <0.1 x10ˆ3/uL (ref <=0.50)
EOSINOPHIL %: 0.6 %
HCT: 41.9 % (ref 34.8–46.0)
HGB: 13.3 g/dL (ref 11.5–16.0)
IMMATURE GRANULOCYTE #: 0.13 x10ˆ3/uL — ABNORMAL HIGH (ref <0.10)
IMMATURE GRANULOCYTE %: 1 % (ref 0.0–1.0)
LYMPHOCYTE #: 2.58 x10ˆ3/uL (ref 1.00–4.80)
LYMPHOCYTE %: 19.3 %
MCH: 27.7 pg (ref 26.0–32.0)
MCHC: 31.7 g/dL (ref 31.0–35.5)
MCV: 87.1 fL (ref 78.0–100.0)
MONOCYTE #: 0.78 x10ˆ3/uL (ref 0.20–1.10)
MONOCYTE %: 5.8 %
MPV: 11 fL (ref 8.7–12.5)
NEUTROPHIL #: 9.79 x10ˆ3/uL — ABNORMAL HIGH (ref 1.50–7.70)
NEUTROPHIL %: 73 %
PLATELETS: 303 x10ˆ3/uL (ref 150–400)
RBC: 4.81 x10ˆ6/uL (ref 3.85–5.22)
RDW-CV: 13.9 % (ref 11.5–15.5)
WBC: 13.4 x10ˆ3/uL — ABNORMAL HIGH (ref 3.7–11.0)

## 2023-11-15 LAB — URINALYSIS, MACROSCOPIC
BILIRUBIN: NEGATIVE mg/dL
BLOOD: NEGATIVE mg/dL
KETONES: NEGATIVE mg/dL
LEUKOCYTES: NEGATIVE WBCs/uL
NITRITE: NEGATIVE
PH: 5.5 (ref 5.0–7.0)
PROTEIN: NEGATIVE mg/dL
SPECIFIC GRAVITY: 1.032 — ABNORMAL HIGH (ref 1.005–1.025)
UROBILINOGEN: 0.2 mg/dL

## 2023-11-15 LAB — BLOOD GAS W/ LACTATE REFLEX
%FIO2 (VENOUS): 21 %
BASE EXCESS: 2.5 mmol/L (ref 0.0–3.0)
BICARBONATE (VENOUS): 26.9 mmol/L (ref 22.0–29.0)
LACTATE: 3.6 mmol/L — ABNORMAL HIGH (ref <=1.9)
O2 SATURATION (VENOUS): 99.7 % (ref 40.0–85.0)
PCO2 (VENOUS): 37 mmHg — ABNORMAL LOW (ref 41–51)
PH (VENOUS): 7.46 — ABNORMAL HIGH (ref 7.32–7.43)
PO2 (VENOUS): 188 mmHg (ref 35–50)

## 2023-11-15 LAB — COMPREHENSIVE METABOLIC PANEL, NON-FASTING
ALBUMIN: 3.6 g/dL (ref 3.5–5.2)
ALKALINE PHOSPHATASE: 87 U/L (ref 35–129)
ALT (SGPT): 24 U/L (ref 0–33)
ANION GAP: 18 mmol/L (ref 7–18)
AST (SGOT): 19 U/L (ref 0–32)
BILIRUBIN TOTAL: 0.3 mg/dL (ref 0.2–1.2)
BUN: 18 mg/dL (ref 8–23)
CALCIUM: 8.8 mg/dL (ref 8.3–10.7)
CHLORIDE: 98 mmol/L (ref 96–106)
CO2 TOTAL: 19 mmol/L — ABNORMAL LOW (ref 22–30)
CREATININE: 0.67 mg/dL (ref 0.50–0.90)
ESTIMATED GFR: >90 mL/min/1.73mˆ2 (ref >90)
GLUCOSE: 376 mg/dL — ABNORMAL HIGH (ref 74–109)
POTASSIUM: 3.8 mmol/L (ref 3.2–5.0)
PROTEIN TOTAL: 6 g/dL — ABNORMAL LOW (ref 6.4–8.3)
SODIUM: 135 mmol/L (ref 133–144)

## 2023-11-15 LAB — URINALYSIS, MICROSCOPIC
BACTERIA: 126.8 /HPF — ABNORMAL HIGH (ref <=0)
RBCS: <2 /HPF (ref <=3.0)
SQUAMOUS EPITHELIAL CELLS: <1.4 /HPF (ref 0.0–10.0)
TOTAL CASTS: <1.4 /LPF (ref <=2.0)
WBCS: <1.8 /HPF (ref <=4.0)

## 2023-11-15 LAB — BETA-HYDROXYBUTYRATE: BETA-HYDROXYBUTYRATE: 0.19 mmol/L (ref 0.00–0.27)

## 2023-11-15 LAB — LACTIC ACID - FIRST REFLEX: LACTIC ACID: 1.8 mmol/L (ref 0.5–2.0)

## 2023-11-15 LAB — NT-PROBNP: NT-PROBNP: 95 pg/mL (ref 0–125)

## 2023-11-15 LAB — POC BLOOD GLUCOSE (RESULTS): GLUCOSE, POC: 391 mg/dL — ABNORMAL HIGH (ref 70–100)

## 2023-11-15 MED ORDER — SODIUM CHLORIDE 0.9% FLUSH BAG - 250 ML
INTRAVENOUS | Status: DC | PRN
Start: 2023-11-15 — End: 2023-11-21

## 2023-11-15 MED ORDER — BUDESONIDE 0.5 MG/2 ML SUSPENSION FOR NEBULIZATION
0.5000 mg | INHALATION_SUSPENSION | Freq: Two times a day (BID) | RESPIRATORY_TRACT | Status: DC
Start: 2023-11-16 — End: 2023-11-21
  Administered 2023-11-16: 0.5 mg via RESPIRATORY_TRACT
  Administered 2023-11-16: 0 mg via RESPIRATORY_TRACT
  Administered 2023-11-16: 0.5 mg via RESPIRATORY_TRACT
  Administered 2023-11-17: 0 mg via RESPIRATORY_TRACT
  Administered 2023-11-17 – 2023-11-20 (×7): 0.5 mg via RESPIRATORY_TRACT
  Administered 2023-11-21: 0 mg via RESPIRATORY_TRACT

## 2023-11-15 MED ORDER — AMLODIPINE 10 MG-BENAZEPRIL 40 MG CAPSULE
1.0000 | ORAL_CAPSULE | Freq: Every day | ORAL | Status: DC
Start: 2023-11-16 — End: 2023-11-16

## 2023-11-15 MED ORDER — DEXTROSE 5% IN WATER (D5W) FLUSH BAG - 250 ML
INTRAVENOUS | Status: DC | PRN
Start: 2023-11-15 — End: 2023-11-21

## 2023-11-15 MED ORDER — METHYLPREDNISOLONE SOD SUCCINATE 40 MG/ML SOLUTION FOR INJ. WRAPPER
40.0000 mg | Freq: Four times a day (QID) | INTRAMUSCULAR | Status: DC
Start: 2023-11-16 — End: 2023-11-17
  Administered 2023-11-16 – 2023-11-17 (×5): 40 mg via INTRAVENOUS
  Filled 2023-11-15 (×5): qty 1

## 2023-11-15 MED ORDER — GLUCAGON 1 MG SOLUTION FOR INJECTION
1.0000 mg | Freq: Once | INTRAMUSCULAR | Status: DC | PRN
Start: 2023-11-15 — End: 2023-11-21

## 2023-11-15 MED ORDER — INSULIN GLARGINE-YFGN (U-100) 100 UNIT/ML SUBQ - CHARGE BY DOSE
10.0000 [IU] | Freq: Two times a day (BID) | SUBCUTANEOUS | Status: DC
Start: 2023-11-16 — End: 2023-11-17
  Administered 2023-11-16 (×3): 10 [IU] via SUBCUTANEOUS
  Filled 2023-11-15 (×3): qty 10

## 2023-11-15 MED ORDER — CETIRIZINE 10 MG TABLET
10.0000 mg | ORAL_TABLET | Freq: Every day | ORAL | Status: DC | PRN
Start: 2023-11-15 — End: 2023-11-21

## 2023-11-15 MED ORDER — ASPIRIN 81 MG TABLET,DELAYED RELEASE
81.0000 mg | DELAYED_RELEASE_TABLET | Freq: Every day | ORAL | Status: DC
Start: 2023-11-16 — End: 2023-11-21
  Administered 2023-11-16 – 2023-11-21 (×6): 81 mg via ORAL
  Filled 2023-11-15 (×6): qty 1

## 2023-11-15 MED ORDER — SODIUM CHLORIDE 0.9 % (FLUSH) INJECTION SYRINGE
3.0000 mL | INJECTION | INTRAMUSCULAR | Status: DC | PRN
Start: 2023-11-15 — End: 2023-11-21
  Administered 2023-11-16: 3 mL

## 2023-11-15 MED ORDER — FLUTICASONE PROPIONATE 50 MCG/ACTUATION NASAL SPRAY,SUSPENSION
2.0000 | Freq: Every day | NASAL | Status: DC
Start: 2023-11-16 — End: 2023-11-21
  Administered 2023-11-16 – 2023-11-20 (×5): 2 via NASAL
  Administered 2023-11-21: 0 via NASAL
  Filled 2023-11-15 (×4): qty 16

## 2023-11-15 MED ORDER — METOPROLOL SUCCINATE ER 25 MG TABLET,EXTENDED RELEASE 24 HR
12.5000 mg | ORAL_TABLET | Freq: Every day | ORAL | Status: DC
Start: 2023-11-16 — End: 2023-11-17
  Administered 2023-11-16 – 2023-11-17 (×2): 12.5 mg via ORAL
  Filled 2023-11-15 (×2): qty 1

## 2023-11-15 MED ORDER — ENOXAPARIN 40 MG/0.4 ML SUBCUTANEOUS SYRINGE
40.0000 mg | INJECTION | SUBCUTANEOUS | Status: DC
Start: 2023-11-16 — End: 2023-11-21
  Administered 2023-11-16: 40 mg via SUBCUTANEOUS
  Administered 2023-11-17: 0 mg via SUBCUTANEOUS
  Administered 2023-11-17 – 2023-11-19 (×3): 40 mg via SUBCUTANEOUS
  Administered 2023-11-21: 0 mg via SUBCUTANEOUS
  Filled 2023-11-15 (×6): qty 0.4

## 2023-11-15 MED ORDER — CYCLOBENZAPRINE 10 MG TABLET
10.0000 mg | ORAL_TABLET | Freq: Three times a day (TID) | ORAL | Status: DC | PRN
Start: 2023-11-15 — End: 2023-11-21

## 2023-11-15 MED ORDER — INSULIN LISPRO 100 UNIT/ML SUB-Q SSIP - CHARGE BY DOSE
3.0000 [IU] | INJECTION | Freq: Four times a day (QID) | SUBCUTANEOUS | Status: DC
Start: 2023-11-16 — End: 2023-11-17
  Administered 2023-11-16: 14 [IU] via SUBCUTANEOUS
  Administered 2023-11-16: 11 [IU] via SUBCUTANEOUS
  Administered 2023-11-16: 14 [IU] via SUBCUTANEOUS
  Administered 2023-11-16: 11 [IU] via SUBCUTANEOUS

## 2023-11-15 MED ORDER — SODIUM CHLORIDE 0.9 % (FLUSH) INJECTION SYRINGE
3.0000 mL | INJECTION | Freq: Three times a day (TID) | INTRAMUSCULAR | Status: DC
Start: 2023-11-16 — End: 2023-11-21
  Administered 2023-11-16: 3 mL
  Administered 2023-11-16: 0 mL
  Administered 2023-11-16 (×2): 3 mL
  Administered 2023-11-17: 20 mL
  Administered 2023-11-17: 0 mL
  Administered 2023-11-17 – 2023-11-18 (×4): 3 mL
  Administered 2023-11-19 (×2): 0 mL
  Administered 2023-11-19: 3 mL
  Administered 2023-11-20 (×2): 0 mL
  Administered 2023-11-20: 3 mL
  Administered 2023-11-21: 0 mL

## 2023-11-15 MED ORDER — IPRATROPIUM 0.5 MG-ALBUTEROL 3 MG (2.5 MG BASE)/3 ML NEBULIZATION SOLN
3.0000 mL | INHALATION_SOLUTION | RESPIRATORY_TRACT | Status: DC | PRN
Start: 2023-11-15 — End: 2023-11-18
  Administered 2023-11-18: 3 mL via RESPIRATORY_TRACT

## 2023-11-15 MED ORDER — ONDANSETRON HCL (PF) 4 MG/2 ML INJECTION SOLUTION
4.0000 mg | Freq: Four times a day (QID) | INTRAMUSCULAR | Status: DC | PRN
Start: 2023-11-15 — End: 2023-11-21
  Administered 2023-11-17: 4 mg via INTRAVENOUS
  Filled 2023-11-15: qty 2

## 2023-11-15 MED ORDER — FOLIC ACID 1 MG TABLET
1.0000 mg | ORAL_TABLET | Freq: Every day | ORAL | Status: DC
Start: 2023-11-16 — End: 2023-11-21
  Administered 2023-11-16 – 2023-11-21 (×6): 1 mg via ORAL
  Filled 2023-11-15 (×6): qty 1

## 2023-11-15 MED ORDER — ARFORMOTEROL 15 MCG/2 ML SOLUTION FOR NEBULIZATION
15.0000 ug | INHALATION_SOLUTION | Freq: Two times a day (BID) | RESPIRATORY_TRACT | Status: DC
Start: 2023-11-16 — End: 2023-11-21
  Administered 2023-11-16 (×2): 15 ug via RESPIRATORY_TRACT
  Administered 2023-11-16 – 2023-11-17 (×2): 0 ug via RESPIRATORY_TRACT
  Administered 2023-11-17 – 2023-11-19 (×5): 15 ug via RESPIRATORY_TRACT
  Administered 2023-11-20: 0 ug via RESPIRATORY_TRACT
  Administered 2023-11-20: 15 ug via RESPIRATORY_TRACT
  Administered 2023-11-21: 0 ug via RESPIRATORY_TRACT

## 2023-11-15 MED ORDER — DEXTROSE 40 % ORAL GEL
15.0000 g | ORAL | Status: DC | PRN
Start: 2023-11-15 — End: 2023-11-21

## 2023-11-15 MED ORDER — DEXTROSE 50 % IN WATER (D50W) INTRAVENOUS SYRINGE
12.5000 g | INJECTION | INTRAVENOUS | Status: DC | PRN
Start: 2023-11-15 — End: 2023-11-21

## 2023-11-15 MED ORDER — FUROSEMIDE 10 MG/ML INJECTION SOLUTION
40.0000 mg | INTRAMUSCULAR | Status: AC
Start: 2023-11-15 — End: 2023-11-15
  Administered 2023-11-15: 40 mg via INTRAVENOUS
  Filled 2023-11-15: qty 4

## 2023-11-15 MED ORDER — ACETAMINOPHEN 325 MG TABLET
650.0000 mg | ORAL_TABLET | ORAL | Status: DC | PRN
Start: 2023-11-15 — End: 2023-11-21
  Administered 2023-11-17: 650 mg via ORAL
  Filled 2023-11-15: qty 2

## 2023-11-15 MED ORDER — PANTOPRAZOLE 40 MG TABLET,DELAYED RELEASE
40.0000 mg | DELAYED_RELEASE_TABLET | Freq: Every day | ORAL | Status: DC
Start: 2023-11-16 — End: 2023-11-21
  Administered 2023-11-16 – 2023-11-21 (×6): 40 mg via ORAL
  Filled 2023-11-15 (×6): qty 1

## 2023-11-15 NOTE — H&P (Incomplete)
 [name]  Admission H&P      Date of Service:  11/16/2023  [name] y.o. female  Date of Admission:  11/15/2023  Date of Birth:  01-14-1956      Chief Complaint:  Lower extremity swelling, dyspnea on exertion      HPI: [name] [name] is a 68 y.o., White female who presents with history of asthma, diabetes mellitus, hypertension who presented to the emergency department complaining of bilateral lower extremity swelling for last 2 weeks.  Yesterday her blood sugar was 500 at home when checked today around noon it was 248.  Patient is complaining of dyspnea on exertion, lower extremity edema, she called her PCP today and PCP advised her to go to the emergency room.  Patient has dyspnea on exertion, denies any chest pain, cough, sputum, fever, chills, sputum, wheezing.        History:    Past Medical:    Past Medical History:   Diagnosis Date    Asthma     Diabetes mellitus, type 2     HTN (hypertension)      Past Surgical:    Past Surgical History:   Procedure Laterality Date    HX CESAREAN SECTION       Family:    Family Medical History:    None       Social:   reports that she has never smoked. She has never used smokeless tobacco. She reports that she does not drink alcohol and does not use drugs.    Allergies[1]  Medications Prior to Admission       Prescriptions    albuterol  sulfate (PROVENTIL  OR VENTOLIN  OR PROAIR ) 90 mcg/actuation Inhalation oral inhaler    Take 1 Puff by inhalation Every 6 hours as needed    albuterol  sulfate (PROVENTIL ) 2.5 mg /3 mL (0.083 %) Inhalation nebulizer solution    Take 3 mL (2.5 mg total) by nebulization Every 6 hours as needed    Amlodipine -Benazepril  10-40 mg Oral Capsule    Take 1 Capsule by mouth Once a day    aspirin  (ECOTRIN) 81 mg Oral Tablet, Delayed Release (E.C.)    Take 1 Tablet (81 mg total) by mouth Once a day    cetirizine (ZYRTEC) 10 mg Oral Tablet    Take 1 Tablet (10 mg total) by mouth Once per day as needed for Allergies     cyclobenzaprine  (FLEXERIL ) 10 mg Oral Tablet    Take 1 Tablet (10 mg total) by mouth Three times a day as needed for Muscle spasms    fluticasone  propionate (FLONASE ) 50 mcg/actuation Nasal Spray, Suspension    Administer 2 Sprays into each nostril Once a day    metoprolol  succinate (TOPROL -XL) 25 mg Oral Tablet Sustained Release 24 hr    Take 0.5 Tablets (12.5 mg total) by mouth Once a day for 30 days    MOUNJARO 5 mg/0.5 mL Subcutaneous Pen Injector    Inject 0.5 mL (5 mg total) under the skin Every 7 days    Patient not taking:  Reported on 03/03/2023    naproxen  sodium (ANAPROX ) 550 mg Oral Tablet    Take 1 Tablet (550 mg total) by mouth Every 12 hours as needed (Pain)    omeprazole (PRILOSEC) 40 mg Oral Capsule, Delayed Release(E.C.)    Take 1 Capsule (40 mg total) by mouth Once a day    predniSONE  (DELTASONE ) 20 mg Oral Tablet    Take 2 Tablets (40 mg total)  by mouth Daily for 5 days    SYMBICORT  160-4.5 mcg/actuation Inhalation oral inhaler    Take 2 Puffs by inhalation Twice daily    SYNJARDY 12.5-1,000 mg Oral Tablet    Take 1 Tablet by mouth Twice daily          acetaminophen  (TYLENOL ) tablet, 650 mg, Oral, Q4H PRN  amLODIPine  (NORVASC ) tablet, 10 mg, Oral, Daily   And  lisinopril  (PRINIVIL ) tablet, 40 mg, Oral, Daily  budesonide  (PULMICORT  RESPULES) 0.5 mg/2 mL nebulizer suspension, 0.5 mg, Nebulization, 2x/day   And  arformoterol (BROVANA) 15 mcg/2 mL nebulizer solution, 15 mcg, Nebulization, 2x/day  aspirin  (ECOTRIN) enteric coated tablet 81 mg, 81 mg, Oral, Daily  cetirizine (zyrTEC) tablet, 10 mg, Oral, Daily PRN  Correction/SSIP insulin  lispro 100 units/mL injection, 3-14 Units, Subcutaneous, 4x/day AC  cyclobenzaprine  (FLEXERIL ) tablet, 10 mg, Oral, 3x/day PRN  NS 250 mL flush bag, , Intravenous, Q15 Min PRN   And  D5W 250 mL flush bag, , Intravenous, Q15 Min PRN  dextrose  (GLUTOSE) 40% oral gel, 15 g, Oral, Q15 Min PRN  dextrose  50% (0.5 g/mL) injection - syringe, 12.5 g, Intravenous, Q15 Min  PRN  enoxaparin  PF (LOVENOX ) 40 mg/0.4 mL SubQ injection, 40 mg, Subcutaneous, Q24H  fluticasone  (FLONASE ) 50 mcg per spray nasal spray, 2 Spray, Each Nostril, Daily  folic acid  (FOLVITE ) tablet, 1 mg, Oral, Daily  glucagon  (GLUCAGEN) injection 1 mg, 1 mg, IntraMUSCULAR, Once PRN  insulin  glargine-yfgn 100 units/mL injection, 10 Units, Subcutaneous, 2x/day  ipratropium-albuterol  0.5 mg-3 mg(2.5 mg base)/3 mL Solution for Nebulization, 3 mL, Nebulization, Q4H PRN  methylPREDNISolone  sod succ (SOLU-medrol ) 40 mg/mL injection, 40 mg, Intravenous, Q6H  metoprolol  succinate (TOPROL -XL) 24 hr extended release tablet, 12.5 mg, Oral, Daily  NS flush syringe, 3 mL, Intracatheter, Q8HRS  NS flush syringe, 3 mL, Intracatheter, Q1H PRN  ondansetron  (ZOFRAN ) 2 mg/mL injection, 4 mg, Intravenous, Q6H PRN  pantoprazole  (PROTONIX ) delayed release tablet, 40 mg, Oral, Daily        ROS:   Review of Systems   Constitutional:  Positive for fatigue.   Respiratory:  Positive for shortness of breath.    Cardiovascular:  Positive for leg swelling.       All other systems negative unless marked.       Exam:  Vitals:    11/16/23 0300 11/16/23 0330 11/16/23 0400 11/16/23 0500   BP:   110/79    Pulse: (!) 48 55 52 50   Resp: 14 15 15 14    Temp:       SpO2: 94% 96% 96% 96%   Weight:       Height:       BMI:               Physical Exam  Vitals reviewed.   Constitutional:       Appearance: She is normal weight.   HENT:      Head: Normocephalic.      Right Ear: Tympanic membrane normal.      Nose: Nose normal.      Mouth/Throat:      Mouth: Mucous membranes are moist.   Eyes:      Pupils: Pupils are equal, round, and reactive to light.   Cardiovascular:      Rate and Rhythm: Normal rate.      Pulses: Normal pulses.   Pulmonary:      Comments: Decreased air entry in both bases with expiratory wheezing bilaterally  Abdominal:  General: Abdomen is flat.   Musculoskeletal:         General: Normal range of motion.      Cervical back: Normal range  of motion.   Skin:     General: Skin is warm.   Neurological:      General: No focal deficit present.      Mental Status: She is alert.       EKG  normal sinus rhythm rate 67, nonspecific ST changes, QTC of 407    Labs:     Results for orders placed or performed during the hospital encounter of 11/15/23 (from the past 24 hours)   POC BLOOD GLUCOSE (RESULTS)    Collection Time: 11/15/23  8:16 PM   Result Value Ref Range    GLUCOSE, POC 391 (H) 70 - 100 mg/dl   URINALYSIS, MACROSCOPIC AND MICROSCOPIC W/CULTURE REFLEX    Collection Time: 11/15/23  8:29 PM    Specimen: Urine, Clean Catch    Narrative    The following orders were created for panel order URINALYSIS, MACROSCOPIC AND MICROSCOPIC W/CULTURE REFLEX.  Procedure                               Abnormality         Status                     ---------                               -----------         ------                     URINALYSIS, MACROSCOPIC[[id number]]      Abnormal            Final result               URINALYSIS, MICROSCOPIC[[id number]]      Abnormal            Final result                 Please view results for these tests on the individual orders.   URINALYSIS, MACROSCOPIC    Collection Time: 11/15/23  8:29 PM   Result Value Ref Range    COLOR Yellow Yellow, Dark Yellow, Light Yellow    APPEARANCE Clear Clear, Slightly Cloudy    PH 5.5 5.0 - 7.0    SPECIFIC GRAVITY 1.032 (H) 1.005 - 1.025    PROTEIN Negative Negative mg/dL    GLUCOSE 3+ (A) Negative mg/dL    KETONES Negative Negative mg/dL    BLOOD Negative Negative, Non-Hemolyzed Trace mg/dL    NITRITE Negative Negative    BILIRUBIN Negative Negative mg/dL    UROBILINOGEN 0.2 0.2 , 1.0 mg/dL    LEUKOCYTES Negative Negative WBCs/uL   URINALYSIS, MICROSCOPIC    Collection Time: 11/15/23  8:29 PM   Result Value Ref Range    RBCS <2.0 <=3.0 /hpf    WBCS <1.8 <=4.0 /hpf    SQUAMOUS EPITHELIAL CELLS <1.4 0.0 - 10.0 /hpf    TOTAL CASTS <1.4 <=2.0 /lpf    BACTERIA 126.8 (H) <=0 /hpf   CBC/DIFF    Collection  Time: 11/15/23  8:43 PM    Narrative    The following orders were created for panel order CBC/DIFF.  Procedure  Abnormality         Status                     ---------                               -----------         ------                     CBC WITH DIFF[[id number]]                Abnormal            Final result                 Please view results for these tests on the individual orders.   BLOOD GAS W/ LACTATE REFLEX Venous    Collection Time: 11/15/23  8:43 PM   Result Value Ref Range    %FIO2 (VENOUS) 21.0 %    PH (VENOUS) 7.46 (H) 7.32 - 7.43    PCO2 (VENOUS) 37 (L) 41 - 51 mm/Hg    PO2 (VENOUS) 188 35 - 50 mm/Hg    BICARBONATE (VENOUS) 26.9 22.0 - 29.0 mmol/L    BASE EXCESS 2.5 0.0 - 3.0 mmol/L    O2 SATURATION (VENOUS) 99.7 40.0 - 85.0 %    LACTATE 3.6 (H) <=1.9 mmol/L    Narrative    Manufacturer does not recommend venous sample for assessment of patient oxygenation status. A reference range for pO2, Oxyhemoglobin and Oxygen Saturation is provided but abnormal results will not flag in Epic.   COMPREHENSIVE METABOLIC PANEL, NON-FASTING    Collection Time: 11/15/23  8:43 PM   Result Value Ref Range    SODIUM 135 133 - 144 mmol/L    POTASSIUM 3.8 3.2 - 5.0 mmol/L    CHLORIDE 98 96 - 106 mmol/L    CO2 TOTAL 19 (L) 22 - 30 mmol/L    ANION GAP 18 7 - 18 mmol/L    BUN 18 8 - 23 mg/dL    CREATININE 9.32 9.49 - 0.90 mg/dL    ESTIMATED GFR >09 >09 mL/min/1.55m^2    ALBUMIN 3.6 3.5 - 5.2 g/dL    CALCIUM 8.8 8.3 - 89.2 mg/dL    GLUCOSE 623 (H) 74 - 109 mg/dL    ALKALINE PHOSPHATASE 87 35 - 129 U/L    ALT (SGPT) 24 0 - 33 U/L    AST (SGOT) 19 0 - 32 U/L    BILIRUBIN TOTAL 0.3 0.2 - 1.2 mg/dL    PROTEIN TOTAL 6.0 (L) 6.4 - 8.3 g/dL   TROPONIN-T NOW    Collection Time: 11/15/23  8:43 PM   Result Value Ref Range    TROPONIN-T 9 <=14 ng/L    Narrative    In order to distinguish acute elevations of high sensitive Troponin from other clinical conditions, the Universal Definition of myocardial  infarction stresses clinical assessment and the need for serial measurements to observe a rise and/or fall above the upper limit of the reference interval.    BETA-HYDROXYBUTYRATE    Collection Time: 11/15/23  8:43 PM   Result Value Ref Range    BETA-HYDROXYBUTYRATE 0.19 0.00 - 0.27 mmol/L   NT-PROBNP    Collection Time: 11/15/23  8:43 PM   Result Value Ref Range    NT-PROBNP 95 0 - 125 pg/mL   CBC WITH DIFF  Collection Time: 11/15/23  8:43 PM   Result Value Ref Range    WBC 13.4 (H) 3.7 - 11.0 x10^3/uL    RBC 4.81 3.85 - 5.22 x10^6/uL    HGB 13.3 11.5 - 16.0 g/dL    HCT 58.0 65.1 - 53.9 %    MCV 87.1 78.0 - 100.0 fL    MCH 27.7 26.0 - 32.0 pg    MCHC 31.7 31.0 - 35.5 g/dL    RDW-CV 86.0 88.4 - 84.4 %    PLATELETS 303 150 - 400 x10^3/uL    MPV 11.0 8.7 - 12.5 fL    NEUTROPHIL % 73.0 %    LYMPHOCYTE % 19.3 %    MONOCYTE % 5.8 %    EOSINOPHIL % 0.6 %    BASOPHIL % 0.3 %    NEUTROPHIL # 9.79 (H) 1.50 - 7.70 x10^3/uL    LYMPHOCYTE # 2.58 1.00 - 4.80 x10^3/uL    MONOCYTE # 0.78 0.20 - 1.10 x10^3/uL    EOSINOPHIL # <0.10 <=0.50 x10^3/uL    BASOPHIL # <0.10 <=0.20 x10^3/uL    IMMATURE GRANULOCYTE % 1.0 0.0 - 1.0 %    IMMATURE GRANULOCYTE # 0.13 (H) <0.10 x10^3/uL   TROPONIN-T IN TWO HOURS    Collection Time: 11/15/23 10:48 PM   Result Value Ref Range    TROPONIN-T 9 <=14 ng/L    Narrative    In order to distinguish acute elevations of high sensitive Troponin from other clinical conditions, the Universal Definition of myocardial infarction stresses clinical assessment and the need for serial measurements to observe a rise and/or fall above the upper limit of the reference interval.    LACTIC ACID - FIRST REFLEX    Collection Time: 11/15/23 10:48 PM   Result Value Ref Range    LACTIC ACID 1.8 0.5 - 2.0 mmol/L   URINALYSIS WITH REFLEX MICROSCOPIC AND CULTURE IF POSITIVE *Canceled*    Collection Time: 11/15/23 11:26 PM    Specimen: Urine, Clean Catch    Narrative    The following orders were created for panel order  URINALYSIS WITH REFLEX MICROSCOPIC AND CULTURE IF POSITIVE.  Procedure                               Abnormality         Status                     ---------                               -----------         ------                       Please view results for these tests on the individual orders.   CBC/DIFF *Canceled*    Collection Time: 11/15/23 11:31 PM    Narrative    The following orders were created for panel order CBC/DIFF.  Procedure                               Abnormality         Status                     ---------                               -----------         ------  Please view results for these tests on the individual orders.   D-DIMER    Collection Time: 11/15/23 11:45 PM   Result Value Ref Range    D-DIMER 0.200 0.000 - 0.520 ug/mL FEU    Narrative    NEGATIVE PREDICTIVE VALUE:   > OR = TO 0.530 ug/mL FEU ----- Positive  < 0.520 ug/mL FEU ------------- Negative     The intended use of D-Dimer is in conjunction with a non-high clinical pre-test probability (PTP) assessment model to exclude deep vein thrombosis (DVT) and pulmondary embolism (PE).      PROCALCITONIN    Collection Time: 11/15/23 11:45 PM   Result Value Ref Range    PROCALCITONIN  0.06 0.00 - 0.50 ng/mL   TROPONIN-T    Collection Time: 11/15/23 11:45 PM   Result Value Ref Range    TROPONIN-T 15 (H) <=14 ng/L    Narrative    In order to distinguish acute elevations of high sensitive Troponin from other clinical conditions, the Universal Definition of myocardial infarction stresses clinical assessment and the need for serial measurements to observe a rise and/or fall above the upper limit of the reference interval.    NT-PROBNP    Collection Time: 11/15/23 11:45 PM   Result Value Ref Range    NT-PROBNP 102 0 - 125 pg/mL   BASIC METABOLIC PANEL, NON-FASTING    Collection Time: 11/15/23 11:45 PM   Result Value Ref Range    SODIUM 138 133 - 144 mmol/L    POTASSIUM 3.9 3.2 - 5.0 mmol/L    CHLORIDE 99 96 - 106 mmol/L     CO2 TOTAL 27 22 - 30 mmol/L    ANION GAP 12 7 - 18 mmol/L    CALCIUM 8.9 8.3 - 10.7 mg/dL    GLUCOSE 662 (H) 74 - 109 mg/dL    BUN 23 8 - 23 mg/dL    CREATININE 9.38 9.49 - 0.90 mg/dL    BUN/CREA RATIO 38     ESTIMATED GFR >90 >90 mL/min/1.43m^2   MAGNESIUM     Collection Time: 11/15/23 11:45 PM   Result Value Ref Range    MAGNESIUM  2.0 1.6 - 2.4 mg/dL   C-REACTIVE PROTEIN(CRP),INFLAMMATION    Collection Time: 11/15/23 11:45 PM   Result Value Ref Range    C-REACTIVE PROTEIN (CRP) 1.3 (H) 0.0 - 0.9 mg/dL   HEPATIC FUNCTION PANEL    Collection Time: 11/15/23 11:45 PM   Result Value Ref Range    ALBUMIN 3.5 3.5 - 5.2 g/dL    ALKALINE PHOSPHATASE 82 35 - 129 U/L    ALT (SGPT) 23 0 - 33 U/L    AST (SGOT) 17 0 - 32 U/L    BILIRUBIN TOTAL 0.2 0.2 - 1.2 mg/dL    BILIRUBIN DIRECT <9.7 0.0 - 0.3 mg/dL    PROTEIN TOTAL 5.9 (L) 6.4 - 8.3 g/dL   LIPASE    Collection Time: 11/15/23 11:45 PM   Result Value Ref Range    LIPASE 31 13 - 60 U/L   POC BLOOD GLUCOSE (RESULTS)    Collection Time: 11/16/23 12:11 AM   Result Value Ref Range    GLUCOSE, POC 335 (H) 70 - 100 mg/dl   CBC/DIFF *Canceled*    Collection Time: 11/16/23 12:16 AM    Narrative    The following orders were created for panel order CBC/DIFF.  Procedure  Abnormality         Status                     ---------                               -----------         ------                     CBC WITH DIFF[[id number]]                                                                 Please view results for these tests on the individual orders.   LACTIC ACID LEVEL W/ REFLEX FOR LEVEL >2.0    Collection Time: 11/16/23  2:51 AM   Result Value Ref Range    LACTIC ACID 1.6 0.5 - 2.0 mmol/L   TROPONIN-T    Collection Time: 11/16/23  2:51 AM   Result Value Ref Range    TROPONIN-T 10 <=14 ng/L    Narrative    In order to distinguish acute elevations of high sensitive Troponin from other clinical conditions, the Universal Definition of myocardial infarction  stresses clinical assessment and the need for serial measurements to observe a rise and/or fall above the upper limit of the reference interval.    CBC/DIFF - AM ONCE    Collection Time: 11/16/23  2:51 AM    Narrative    The following orders were created for panel order CBC/DIFF - AM ONCE.  Procedure                               Abnormality         Status                     ---------                               -----------         ------                     CBC WITH DIFF[[id number]]                Abnormal            Final result                 Please view results for these tests on the individual orders.   BASIC METABOLIC PANEL    Collection Time: 11/16/23  2:51 AM   Result Value Ref Range    SODIUM 135 133 - 144 mmol/L    POTASSIUM 4.1 3.2 - 5.0 mmol/L    CHLORIDE 98 96 - 106 mmol/L    CO2 TOTAL 24 22 - 30 mmol/L    ANION GAP 13 7 - 18 mmol/L    CALCIUM 9.1 8.3 - 10.7 mg/dL    GLUCOSE 629 (H) 74 - 109 mg/dL    BUN 22 8 - 23 mg/dL    CREATININE 9.40 9.49 - 0.90 mg/dL  BUN/CREA RATIO 37     ESTIMATED GFR >90 >90 mL/min/1.3m^2   PHOSPHORUS    Collection Time: 11/16/23  2:51 AM   Result Value Ref Range    PHOSPHORUS 3.6 2.5 - 4.5 mg/dL   MAGNESIUM     Collection Time: 11/16/23  2:51 AM   Result Value Ref Range    MAGNESIUM  2.0 1.6 - 2.4 mg/dL   CBC WITH DIFF    Collection Time: 11/16/23  2:51 AM   Result Value Ref Range    WBC 11.4 (H) 3.7 - 11.0 x10^3/uL    RBC 4.74 3.85 - 5.22 x10^6/uL    HGB 13.2 11.5 - 16.0 g/dL    HCT 59.0 65.1 - 53.9 %    MCV 86.3 78.0 - 100.0 fL    MCH 27.8 26.0 - 32.0 pg    MCHC 32.3 31.0 - 35.5 g/dL    RDW-CV 86.0 88.4 - 84.4 %    PLATELETS 286 150 - 400 x10^3/uL    MPV 10.6 8.7 - 12.5 fL    NEUTROPHIL % 86.0 %    LYMPHOCYTE % 9.9 %    MONOCYTE % 2.6 %    EOSINOPHIL % 0.3 %    BASOPHIL % 0.3 %    NEUTROPHIL # 9.77 (H) 1.50 - 7.70 x10^3/uL    LYMPHOCYTE # 1.13 1.00 - 4.80 x10^3/uL    MONOCYTE # 0.30 0.20 - 1.10 x10^3/uL    EOSINOPHIL # <0.10 <=0.50 x10^3/uL    BASOPHIL # <0.10 <=0.20  x10^3/uL    IMMATURE GRANULOCYTE % 0.9 0.0 - 1.0 %    IMMATURE GRANULOCYTE # 0.10 (H) <0.10 x10^3/uL   POC BLOOD GLUCOSE (RESULTS)    Collection Time: 11/16/23  5:14 AM   Result Value Ref Range    GLUCOSE, POC 344 (H) 70 - 100 mg/dl   URINALYSIS WITH REFLEX MICROSCOPIC AND CULTURE IF POSITIVE    Collection Time: 11/16/23  5:27 AM    Specimen: Urine, Clean Catch    Narrative    The following orders were created for panel order URINALYSIS WITH REFLEX MICROSCOPIC AND CULTURE IF POSITIVE.  Procedure                               Abnormality         Status                     ---------                               -----------         ------                     URINALYSIS, MACRO/MICRO[[id number]]                          In process                   Please view results for these tests on the individual orders.        Imaging Studies:    CT ANGIO CHEST FOR PULMONARY EMBOLUS W IV CONTRAST   Final Result      1. Negative for acute pulmonary embolus.   2. Solid pulmonary nodule in the right upper lobe measuring 8 mm. Findings may be further evaluated with PET CT  or follow-up CT chest.   3. Similar appearance of elongated mixed solid and groundglass opacity in the left upper lobe which may also be further evaluated with PET CT.         Radiologist location ID: [id number]         XR CHEST PA AND LATERAL   Final Result      No radiographic evidence of acute cardiopulmonary process.                  Radiologist location ID: [id number]             DNR Status:  FULL CODE: ATTEMPT RESUSCITATION/CPR    Assessment/Plan:   Active Hospital Problems    Diagnosis    Primary Problem: Dyspnea       Acute hypoxic respiratory failure  Chronic Obstructive Pulmonary Disease exacerbation  Admit to med surge tele bed  Order CTA chest which showed no evidence of pulmonary embolism, solid pulmonary nodule in the right upper lobe measuring 8 mm findings may further be evaluated with PET head CT scan,  Consult with Pulmonary      Right upper lobe  8 mm pulmonary nodule  Consult with Pulmonary      Uncontrolled diabetes mellitus  Start Lantus  insulin  10 units b.i.d.  Insulin  sliding scale      Hypertension  On amlodipine , benazepril , metoprolol       Gastroesophageal reflux disease  On Protonix     Code status  Full code      DVT/PE Prophylaxis:   On subQ Lovenox       Jackee Arty Lot, MD    This note was partially generated using MModal Fluency Direct system, and there may be some incorrect words, spellings, and punctuation that were not noted in checking the note before saving.         [1]   Allergies  Allergen Reactions    Ceftriaxone   Other Adverse Reaction (Add comment)     Unsure if it was a steriod or ceftriaxone . It was given at same time. Tongue swelling reported previously     Dilaudid [Hydromorphone]

## 2023-11-15 NOTE — ED Nurses Note (Signed)
 Pt called back for triage, pt in the bathroom at this time.

## 2023-11-15 NOTE — ED Provider Notes (Signed)
 [facility] - Emergency Department  ED Physician Note      Arrival: Car    Chief Complaint:    Chief Complaint   Patient presents with    Hyperglycemia     Pt to ED c/o high blood sugar. Pt states that her sugar was 500 last night at home. Pt states that she checked her sugar today around noon and her glucose was 248.      Hypertension     States that today around 14:30 her blood pressure was 190 something and was given an emergency blood pressure pill by her PCP today. Pt c/o a headache. Blood pressure in triage is 157/67    Leg Swelling     Pt c/o bilateral leg swelling that started 2 weeks ago, pt was started on lasix. Pt states that her legs feels tight and itchy. No redness noted. Pt also states that her lactic acid has been high was recently seen at [facility] ED and discharged.      [name] [name] is a 68 y.o. female who had concerns including Hyperglycemia, Hypertension, and Leg Swelling.    History of Present Illness:  This is a 68 year old female with a history of hypertension and insulin -dependent diabetes.  She presents emerged department today reporting dyspnea on exertion with the lower extremity edema.  Also states that her blood sugars has been elevated in her systolic blood pressure today was greater than 200.  These symptom has been prevalent for the last 2 weeks.  She has been to a primary care provider [facility] and now here.  She states to laboratory studies prior to have showed him to be hyperglycemic as well as with the persistent lactic acidosis.  She has then given diuretics.  She is concerned due to the persistence symptoms and now the uncontrolled blood sugars and blood pressure.  No complaint of pain currently no fever.  Denies other systemic symptoms of illness.  Reports the Lasix I did in decrease her weight by 4 lb but she is still symptomatic.      Hyperglycemia  Hypertension  Leg Swelling      Review of Systems:  Review of Systems    Cardiovascular:  Positive for leg swelling.       PMH/PSH/FH/SH:    Past Medical History:   Diagnosis Date    Asthma     Diabetes mellitus, type 2     HTN (hypertension)        Past Surgical History:   Procedure Laterality Date    Hx cesarean section       No family history on file.    Social History     Socioeconomic History    Marital status: Married     Spouse name: Not on file    Number of children: Not on file    Years of education: Not on file    Highest education level: Not on file   Occupational History    Not on file   Tobacco Use    Smoking status: Never    Smokeless tobacco: Never   Vaping Use    Vaping status: Never Used   Substance and Sexual Activity    Alcohol use: Never    Drug use: Never    Sexual activity: Not on file   Other Topics Concern    Not on file   Social History Narrative    Not on file     Social Determinants of Health  Financial Resource Strain: Not on file   Transportation Needs: Not on file   Social Connections: Low Risk (02/21/2023)    Social Connections     SDOH Social Isolation: 5 or more times a week   Intimate Partner Violence: Not on file   Housing Stability: Not on file       Meds/Allergies:    Current Outpatient Medications   Medication Instructions    albuterol  sulfate (PROVENTIL  OR VENTOLIN  OR PROAIR ) 90 mcg/actuation Inhalation oral inhaler 1 Puff, Inhalation, EVERY 6 HOURS PRN    albuterol  sulfate (PROVENTIL ) 2.5 mg, Nebulization, EVERY 6 HOURS PRN    Amlodipine -Benazepril  10-40 mg Oral Capsule 1 Capsule, Oral, Daily    aspirin  (ECOTRIN) 81 mg, Oral, Daily    cetirizine (ZYRTEC) 10 mg, Oral, DAILY PRN    cyclobenzaprine  (FLEXERIL ) 10 mg, Oral, 3 TIMES DAILY PRN    fluticasone  propionate (FLONASE ) 50 mcg/actuation Nasal Spray, Suspension 2 Sprays, Each Nostril, Daily    metoprolol  succinate (TOPROL -XL) 12.5 mg, Oral, Daily    Mounjaro 5 mg, EVERY 7 DAYS    naproxen  sodium (ANAPROX ) 550 mg, Oral, EVERY 12 HOURS PRN    omeprazole (PRILOSEC) 40 mg, Oral, Daily    SYMBICORT   160-4.5 mcg/actuation Inhalation oral inhaler 2 Puffs, Inhalation, 2 TIMES DAILY    SYNJARDY 12.5-1,000 mg Oral Tablet 1 Tablet, Oral, 2 TIMES DAILY      Allergies[1]    Physical Exam:    ED Triage Vitals [11/15/23 2018]   BP (Non-Invasive) (!) 157/67   Heart Rate 77   Respiratory Rate 18   Temperature 36.3 C (97.3 F)   SpO2 98 %   Weight 86.2 kg (190 lb)   Height 1.6 m (5' 3)     Physical Exam   CONSTITUTIONAL: [Alert and oriented and responds appropriately to questions.]  HEAD: [Normocephalic; atraumatic]  EYES: PERRL; [Conjunctivae clear, sclerae non-icteric]  ENT: [normal nose; no rhinorrhea; moist mucous membranes; pharynx without lesions noted]  NECK: [Supple without meningismus; non-tender;]  EXT: [Appear atraumatic; non-tender to palpation; no cyanosis, no effusions, 3+ pitting edema to the level of the ankles SKIN: [No rashes, lesions, or pigment changes appreciated on gross exam. Capillary refill less than three seconds]  NEURO: [Moves all extremities equally; Sensory function intact]  PSYCH: [The patient's mood and manner are appropriate. Grooming and personal hygiene are appropriate.]   Results:    Labs Ordered/Reviewed   BLOOD GAS W/ LACTATE REFLEX - Abnormal; Notable for the following components:       Result Value    PH (VENOUS) 7.46 (*)     PCO2 (VENOUS) 37 (*)     LACTATE 3.6 (*)     All other components within normal limits    Narrative:     Manufacturer does not recommend venous sample for assessment of patient oxygenation status. A reference range for pO2, Oxyhemoglobin and Oxygen Saturation is provided but abnormal results will not flag in Epic.   COMPREHENSIVE METABOLIC PANEL, NON-FASTING - Abnormal; Notable for the following components:    CO2 TOTAL 19 (*)     GLUCOSE 376 (*)     PROTEIN TOTAL 6.0 (*)     All other components within normal limits   CBC WITH DIFF - Abnormal; Notable for the following components:    WBC 13.4 (*)     NEUTROPHIL # 9.79 (*)     IMMATURE GRANULOCYTE # 0.13 (*)      All other components within normal limits   URINALYSIS, MACROSCOPIC -  Abnormal; Notable for the following components:    SPECIFIC GRAVITY 1.032 (*)     GLUCOSE 3+ (*)     All other components within normal limits   URINALYSIS, MICROSCOPIC - Abnormal; Notable for the following components:    BACTERIA 126.8 (*)     All other components within normal limits   POC BLOOD GLUCOSE (RESULTS) - Abnormal; Notable for the following components:    GLUCOSE, POC 391 (*)     All other components within normal limits   TROPONIN-T - Normal    Narrative:     In order to distinguish acute elevations of high sensitive Troponin from other clinical conditions, the Universal Definition of myocardial infarction stresses clinical assessment and the need for serial measurements to observe a rise and/or fall above the upper limit of the reference interval.    TROPONIN-T - Normal    Narrative:     In order to distinguish acute elevations of high sensitive Troponin from other clinical conditions, the Universal Definition of myocardial infarction stresses clinical assessment and the need for serial measurements to observe a rise and/or fall above the upper limit of the reference interval.    BETA-HYDROXYBUTYRATE - Normal   NT-PROBNP - Normal   LACTIC ACID - FIRST REFLEX - Normal   ADULT ROUTINE BLOOD CULTURE, SET OF 2 BOTTLES (BACTERIA AND YEAST)   URINE CULTURE   CBC/DIFF    Narrative:     The following orders were created for panel order CBC/DIFF.  Procedure                               Abnormality         Status                     ---------                               -----------         ------                     CBC WITH DIFF[[id number]]                Abnormal            Final result                 Please view results for these tests on the individual orders.   URINALYSIS, MACROSCOPIC AND MICROSCOPIC W/CULTURE REFLEX    Narrative:     The following orders were created for panel order URINALYSIS, MACROSCOPIC AND MICROSCOPIC W/CULTURE  REFLEX.  Procedure                               Abnormality         Status                     ---------                               -----------         ------                     URINALYSIS, MACROSCOPIC[[id number]]      Abnormal  Final result               URINALYSIS, MICROSCOPIC[[id number]]      Abnormal            Final result                 Please view results for these tests on the individual orders.   D-DIMER   PROCALCITONIN   TROPONIN-T   URINALYSIS WITH REFLEX MICROSCOPIC AND CULTURE IF POSITIVE    Narrative:     The following orders were created for panel order URINALYSIS WITH REFLEX MICROSCOPIC AND CULTURE IF POSITIVE.  Procedure                               Abnormality         Status                     ---------                               -----------         ------                     URINALYSIS, MACRO/MICRO[[id number]]                                                       Please view results for these tests on the individual orders.   URINALYSIS, MACRO/MICRO       XR CHEST PA AND LATERAL   Final Result by Edi, Radresults In (10/08 2102)      No radiographic evidence of acute cardiopulmonary process.                  Radiologist location ID: [id number]             ED Course:       Medications Ordered/Administered in the ED   aspirin  (ECOTRIN) enteric coated tablet 81 mg (has no administration in time range)   Amlodipine -Benazepril  10-40 mg Capsule 1 Capsule (has no administration in time range)   fluticasone  (FLONASE ) 50 mcg per spray nasal spray (has no administration in time range)   cyclobenzaprine  (FLEXERIL ) tablet (has no administration in time range)   cetirizine (zyrTEC) tablet (has no administration in time range)   pantoprazole  (PROTONIX ) delayed release tablet (has no administration in time range)   budesonide  (PULMICORT  RESPULES) 0.5 mg/2 mL nebulizer suspension (has no administration in time range)     And   arformoterol (BROVANA) 15 mcg/2 mL nebulizer solution (has no  administration in time range)   metoprolol  succinate (TOPROL -XL) 24 hr extended release tablet (has no administration in time range)   NS flush syringe (has no administration in time range)   NS flush syringe (has no administration in time range)   NS 250 mL flush bag (has no administration in time range)     And   D5W 250 mL flush bag (has no administration in time range)   Correction/SSIP insulin  lispro 100 units/mL injection (has no administration in time range)   dextrose  (GLUTOSE) 40% oral gel (has no administration in time range)   dextrose  50% (0.5 g/mL) injection - syringe (  has no administration in time range)   glucagon  (GLUCAGEN) injection 1 mg (has no administration in time range)   ondansetron  (ZOFRAN ) 2 mg/mL injection (has no administration in time range)   folic acid  (FOLVITE ) tablet (has no administration in time range)   acetaminophen  (TYLENOL ) tablet (has no administration in time range)   ipratropium-albuterol  0.5 mg-3 mg(2.5 mg base)/3 mL Solution for Nebulization (has no administration in time range)   enoxaparin  PF (LOVENOX ) 40 mg/0.4 mL SubQ injection (has no administration in time range)   furosemide (LASIX) 10 mg/mL injection (40 mg Intravenous Given 11/15/23 2314)       MDM:       Medical Decision Making  A pleasant 68 year old female presenting here hypertensive.  The patient presents here after multiple a visits with the attempt to diagnose her underlying disease.  She states concern over the elevated blood pressure as well as uncontrolled blood sugars as well.  Here she had shows 3+ pitting edema.  She is lying at about 60 with a head elevation in his Z that has not requiring in his supplemental oxygen.  Selling her doctor just started on p.r.n. clonidine.  Labs do confirm lactic acidosis.  She is hyperglycemic but does not have a gap acidosis.  She is alkalotic on her blood gas.  X-ray was nondiagnostic.  I think given the failure of outpatient treatment in the multiple visits as well  as evidence of volume overload it would be best served for the patient to be admitted to hospital for further management.  This is discussed with patient's family and hospitalist who in agreement    Amount and/or Complexity of Data Reviewed  External Data Reviewed: labs, radiology and notes.     Details: Notes from [facility] last ER visit as well as laboratory and imaging  Labs:  Decision-making details documented in ED Course.  Radiology:  Decision-making details documented in ED Course.  ECG/medicine tests:  Decision-making details documented in ED Course.  Discussion of management or test interpretation with external provider(s): Spoke with the hospitalist for admission    Risk  Prescription drug management.  Decision regarding hospitalization.        Impression:    Clinical Impression   Hyperglycemia (Primary)   Lactic acidosis   Dependent edema   Dyspnea on exertion   Hypertension, unspecified type         Disposition:   Admitted        Part of this note may have been generated using voice recognition software.  Be advised, it is possible that the generated note may be prone to syntax and other dictation software errors.         [1]   Allergies  Allergen Reactions    Ceftriaxone   Other Adverse Reaction (Add comment)     Unsure if it was a steriod or ceftriaxone . It was given at same time. Tongue swelling reported previously     Dilaudid [Hydromorphone]

## 2023-11-15 NOTE — ED Triage Notes (Signed)
 [facility] - Emergency Department    [name] [name] is a 68 y.o. female presenting to the ED today for:  Hyperglycemia, Hypertension, and Leg Swelling    Additional History:   Pt reports she has been experiencing bilateral leg swelling the last 2 weeks so she was placed on lasix. Pt reports that yesterday her BS was 500 at home and when she checked it today around noon it was 248. Pt reports she went to her PCP today and her systolic BP was 190 so her PCP gave her a dose of BP medication. Pt reports she has had a headache since then.         Pertinent Exam findings:  Filed Vitals:    11/15/23 2018   BP: (!) 157/67   Pulse: 77   Resp: 18   Temp: 36.3 C (97.3 F)   SpO2: 98%       Gen: Nontoxic in appearance.  Head: NC/AT  Neck: Trachea midline. No gross meningismus.  Psych: Denis SI/HI.    Assessment: Medical Screening Exam.    Plan/MDM at Triage:  I have considered labs, imaging, EKG.  These have been ordered as clinically indicated.  I have considered that the patient may need medications.  However, due to treatment space limitation, medications considered have to be appropriate for the waiting room.    I have considered the patient's chronic conditions and have counseled the patient that we will be evaluating for the presence of an emergency medical condition today in which their chronic conditions may or may not play a role.  I have advised that the patient should notify their PCP of today's visit regardless of disposition.  I have considered the patient's social determinants of health and health care literacy while determining the patient's initial plan of care.  I have further advised that the patient will possibly see a 2nd emergency provider today during their stay to help promote throughput.    I have performed an initial medical screening evaluation in order to determine if the patient has an emergency medical condition. The patient has been evaluated.  Imminent life, limb, or sight  threatening emergency will be taken to the treatment area immediately. The patient's orders will be reviewed by nursing staff and placed in the treatment area as soon as possible as bed capacity allows.  Should the patient's status change, or a new sign/symptom develop, immediate notification of nursing or ED staff has been advised.

## 2023-11-16 ENCOUNTER — Inpatient Hospital Stay (HOSPITAL_COMMUNITY)

## 2023-11-16 ENCOUNTER — Encounter (HOSPITAL_COMMUNITY): Payer: Self-pay | Admitting: Internal Medicine

## 2023-11-16 ENCOUNTER — Other Ambulatory Visit: Payer: Self-pay

## 2023-11-16 DIAGNOSIS — R0602 Shortness of breath: Secondary | ICD-10-CM

## 2023-11-16 DIAGNOSIS — K219 Gastro-esophageal reflux disease without esophagitis: Secondary | ICD-10-CM

## 2023-11-16 DIAGNOSIS — D72829 Elevated white blood cell count, unspecified: Secondary | ICD-10-CM

## 2023-11-16 DIAGNOSIS — R609 Edema, unspecified: Secondary | ICD-10-CM

## 2023-11-16 DIAGNOSIS — E1165 Type 2 diabetes mellitus with hyperglycemia: Secondary | ICD-10-CM

## 2023-11-16 DIAGNOSIS — J4489 Other specified chronic obstructive pulmonary disease: Secondary | ICD-10-CM

## 2023-11-16 DIAGNOSIS — J9601 Acute respiratory failure with hypoxia: Secondary | ICD-10-CM

## 2023-11-16 DIAGNOSIS — I517 Cardiomegaly: Secondary | ICD-10-CM

## 2023-11-16 DIAGNOSIS — Z87891 Personal history of nicotine dependence: Secondary | ICD-10-CM

## 2023-11-16 DIAGNOSIS — Z794 Long term (current) use of insulin: Secondary | ICD-10-CM

## 2023-11-16 DIAGNOSIS — Z7951 Long term (current) use of inhaled steroids: Secondary | ICD-10-CM

## 2023-11-16 DIAGNOSIS — R918 Other nonspecific abnormal finding of lung field: Secondary | ICD-10-CM

## 2023-11-16 DIAGNOSIS — Z7982 Long term (current) use of aspirin: Secondary | ICD-10-CM

## 2023-11-16 DIAGNOSIS — J45909 Unspecified asthma, uncomplicated: Secondary | ICD-10-CM

## 2023-11-16 DIAGNOSIS — Z79899 Other long term (current) drug therapy: Secondary | ICD-10-CM

## 2023-11-16 DIAGNOSIS — I119 Hypertensive heart disease without heart failure: Secondary | ICD-10-CM

## 2023-11-16 LAB — BASIC METABOLIC PANEL
ANION GAP: 12 mmol/L (ref 7–18)
ANION GAP: 13 mmol/L (ref 7–18)
BUN/CREA RATIO: 37
BUN/CREA RATIO: 38
BUN: 22 mg/dL (ref 8–23)
BUN: 23 mg/dL (ref 8–23)
CALCIUM: 8.9 mg/dL (ref 8.3–10.7)
CALCIUM: 9.1 mg/dL (ref 8.3–10.7)
CHLORIDE: 98 mmol/L (ref 96–106)
CHLORIDE: 99 mmol/L (ref 96–106)
CO2 TOTAL: 24 mmol/L (ref 22–30)
CO2 TOTAL: 27 mmol/L (ref 22–30)
CREATININE: 0.59 mg/dL (ref 0.50–0.90)
CREATININE: 0.61 mg/dL (ref 0.50–0.90)
ESTIMATED GFR: >90 mL/min/1.73mˆ2 (ref >90)
ESTIMATED GFR: >90 mL/min/1.73mˆ2 (ref >90)
GLUCOSE: 337 mg/dL — ABNORMAL HIGH (ref 74–109)
GLUCOSE: 370 mg/dL — ABNORMAL HIGH (ref 74–109)
POTASSIUM: 3.9 mmol/L (ref 3.2–5.0)
POTASSIUM: 4.1 mmol/L (ref 3.2–5.0)
SODIUM: 135 mmol/L (ref 133–144)
SODIUM: 138 mmol/L (ref 133–144)

## 2023-11-16 LAB — CBC WITH DIFF
BASOPHIL #: <0.1 x10ˆ3/uL (ref <=0.20)
BASOPHIL %: 0.3 %
EOSINOPHIL #: <0.1 x10ˆ3/uL (ref <=0.50)
EOSINOPHIL %: 0.3 %
HCT: 40.9 % (ref 34.8–46.0)
HGB: 13.2 g/dL (ref 11.5–16.0)
IMMATURE GRANULOCYTE #: 0.1 x10ˆ3/uL — ABNORMAL HIGH (ref <0.10)
IMMATURE GRANULOCYTE %: 0.9 % (ref 0.0–1.0)
LYMPHOCYTE #: 1.13 x10ˆ3/uL (ref 1.00–4.80)
LYMPHOCYTE %: 9.9 %
MCH: 27.8 pg (ref 26.0–32.0)
MCHC: 32.3 g/dL (ref 31.0–35.5)
MCV: 86.3 fL (ref 78.0–100.0)
MONOCYTE #: 0.3 x10ˆ3/uL (ref 0.20–1.10)
MONOCYTE %: 2.6 %
MPV: 10.6 fL (ref 8.7–12.5)
NEUTROPHIL #: 9.77 x10ˆ3/uL — ABNORMAL HIGH (ref 1.50–7.70)
NEUTROPHIL %: 86 %
PLATELETS: 286 x10ˆ3/uL (ref 150–400)
RBC: 4.74 x10ˆ6/uL (ref 3.85–5.22)
RDW-CV: 13.9 % (ref 11.5–15.5)
WBC: 11.4 x10ˆ3/uL — ABNORMAL HIGH (ref 3.7–11.0)

## 2023-11-16 LAB — URINALYSIS, MACRO/MICRO
BACTERIA: 6.1 /HPF — ABNORMAL HIGH (ref <=0)
BILIRUBIN: NEGATIVE mg/dL
BLOOD: NEGATIVE mg/dL
KETONES: NEGATIVE mg/dL
LEUKOCYTES: NEGATIVE WBCs/uL
NITRITE: NEGATIVE
PH: 5.5 (ref 5.0–7.0)
PROTEIN: NEGATIVE mg/dL
RBCS: <2 /HPF (ref <=3.0)
SPECIFIC GRAVITY: 1.034 — ABNORMAL HIGH (ref 1.005–1.025)
SQUAMOUS EPITHELIAL CELLS: <1.4 /HPF (ref 0.0–10.0)
TOTAL CASTS: <1.4 /LPF (ref <=2.0)
UROBILINOGEN: 0.2 mg/dL
WBCS: <1.8 /HPF (ref <=4.0)

## 2023-11-16 LAB — ECG 12 LEAD
Atrial Rate: 67 {beats}/min
Calculated P Axis: 66 degrees
Calculated R Axis: 17 degrees
Calculated T Axis: 49 degrees
PR Interval: 154 ms
QRS Duration: 74 ms
QT Interval: 386 ms
QTC Calculation: 407 ms
Ventricular rate: 67 {beats}/min

## 2023-11-16 LAB — PROCALCITONIN: PROCALCITONIN: 0.06 ng/mL (ref 0.00–0.50)

## 2023-11-16 LAB — LACTIC ACID LEVEL W/ REFLEX FOR LEVEL >2.0: LACTIC ACID: 1.6 mmol/L (ref 0.5–2.0)

## 2023-11-16 LAB — HEPATIC FUNCTION PANEL
ALBUMIN: 3.5 g/dL (ref 3.5–5.2)
ALKALINE PHOSPHATASE: 82 U/L (ref 35–129)
ALT (SGPT): 23 U/L (ref 0–33)
AST (SGOT): 17 U/L (ref 0–32)
BILIRUBIN DIRECT: <0.2 mg/dL (ref 0.0–0.3)
BILIRUBIN TOTAL: 0.2 mg/dL (ref 0.2–1.2)
PROTEIN TOTAL: 5.9 g/dL — ABNORMAL LOW (ref 6.4–8.3)

## 2023-11-16 LAB — POC BLOOD GLUCOSE (RESULTS)
GLUCOSE, POC: 331 mg/dL — ABNORMAL HIGH (ref 70–100)
GLUCOSE, POC: 335 mg/dL — ABNORMAL HIGH (ref 70–100)
GLUCOSE, POC: 335 mg/dL — ABNORMAL HIGH (ref 70–100)
GLUCOSE, POC: 344 mg/dL — ABNORMAL HIGH (ref 70–100)
GLUCOSE, POC: 365 mg/dL — ABNORMAL HIGH (ref 70–100)
GLUCOSE, POC: 404 mg/dL — ABNORMAL HIGH (ref 70–100)

## 2023-11-16 LAB — D-DIMER: D-DIMER: 0.2 ug{FEU}/mL (ref 0.000–0.520)

## 2023-11-16 LAB — MAGNESIUM
MAGNESIUM: 2 mg/dL (ref 1.6–2.4)
MAGNESIUM: 2 mg/dL (ref 1.6–2.4)

## 2023-11-16 LAB — TROPONIN-T
TROPONIN-T: 10 ng/L (ref <=14)
TROPONIN-T: 15 ng/L — ABNORMAL HIGH (ref <=14)

## 2023-11-16 LAB — TRANSTHORACIC ECHOCARDIOGRAM - ADULT
EF VISUAL ESTIMATE: 60
EF: 65

## 2023-11-16 LAB — RHEUMATOID FACTOR, SERUM: RHEUMATOID FACTOR: 181 [IU]/mL — ABNORMAL HIGH (ref 0–14)

## 2023-11-16 LAB — NT-PROBNP: NT-PROBNP: 102 pg/mL (ref 0–125)

## 2023-11-16 LAB — C-REACTIVE PROTEIN(CRP),INFLAMMATION: C-REACTIVE PROTEIN (CRP): 1.3 mg/dL — ABNORMAL HIGH (ref 0.0–0.9)

## 2023-11-16 LAB — SEDIMENTATION RATE: ERYTHROCYTE SEDIMENTATION RATE (ESR): 41 mm/h — ABNORMAL HIGH (ref 0–20)

## 2023-11-16 LAB — LIPASE: LIPASE: 31 U/L (ref 13–60)

## 2023-11-16 LAB — PHOSPHORUS: PHOSPHORUS: 3.6 mg/dL (ref 2.5–4.5)

## 2023-11-16 MED ORDER — LISINOPRIL 20 MG TABLET
40.0000 mg | ORAL_TABLET | Freq: Every day | ORAL | Status: DC
Start: 2023-11-16 — End: 2023-11-21
  Administered 2023-11-16 – 2023-11-21 (×6): 40 mg via ORAL
  Filled 2023-11-16 (×6): qty 2

## 2023-11-16 MED ORDER — IOPAMIDOL 370 MG IODINE/ML (76 %) INTRAVENOUS SOLUTION
100.0000 mL | INTRAVENOUS | Status: AC
Start: 2023-11-16 — End: 2023-11-16
  Administered 2023-11-16: 100 mL via INTRAVENOUS

## 2023-11-16 MED ORDER — DOXYCYCLINE HYCLATE 100 MG CAPSULE
100.0000 mg | ORAL_CAPSULE | Freq: Two times a day (BID) | ORAL | Status: DC
Start: 2023-11-16 — End: 2023-11-21
  Administered 2023-11-16 – 2023-11-21 (×11): 100 mg via ORAL
  Filled 2023-11-16 (×11): qty 1

## 2023-11-16 MED ORDER — ALBUTEROL SULFATE 2.5 MG/3 ML (0.083 %) SOLUTION FOR NEBULIZATION
2.5000 mg | INHALATION_SOLUTION | Freq: Once | RESPIRATORY_TRACT | Status: AC
Start: 2023-11-16 — End: 2023-11-16
  Administered 2023-11-16: 2.5 mg via RESPIRATORY_TRACT

## 2023-11-16 MED ORDER — AMLODIPINE 5 MG TABLET
10.0000 mg | ORAL_TABLET | Freq: Every day | ORAL | Status: DC
Start: 2023-11-16 — End: 2023-11-17
  Administered 2023-11-16 – 2023-11-17 (×2): 10 mg via ORAL
  Filled 2023-11-16 (×2): qty 2

## 2023-11-16 NOTE — ED Nurses Note (Signed)
 Pt assisted to hospital bed and repositioned for comfort.

## 2023-11-16 NOTE — ED Nurses Note (Signed)
 Patient report called to [facility] on 3 F, patient ready for transport

## 2023-11-16 NOTE — ED Nurses Note (Signed)
 Pt taken via W/C by RT for pulmonary function test.

## 2023-11-16 NOTE — Consults (Signed)
 [facility]    Pulmonary Consult note    Patient Name: [name] [name]  Date: 11/15/2023  Department:  [facility] - EMERGENCY DEPARTMENT  MRN: [id number]  DOB: 04/11/1955    Referring Physician:  Dr. Medford    Reason for Consult:  Shortness of breath    CC:  Shortness breath    HPI: [name] [name] is a 68 y.o., White female with past medical history of asthma hypertension who presents with leg swelling and shortness of breath.  She has shortness of breath with exertion, intermittent, improved with rest, associated for leg swelling, not associated with a wheeze or chest pain.  She has a history of asthma on PRN inhaler at home.  She is nonsmoker.  She denies respiratory exposures.  She has a history of several CTs over the past year which have shown right upper lobe nodularity up to 6 mm which now shows about 8 mm.  She has had some ground-glass opacity on the left side which has been stable.    Past medical history  Asthma  Obesity  Hypertension     Medication History    Current Facility-Administered Medications   Medication Dose Route Frequency    acetaminophen  (TYLENOL ) tablet  650 mg Oral Q4H PRN    amLODIPine  (NORVASC ) tablet  10 mg Oral Daily    And    lisinopril  (PRINIVIL ) tablet  40 mg Oral Daily    budesonide  (PULMICORT  RESPULES) 0.5 mg/2 mL nebulizer suspension  0.5 mg Nebulization 2x/day    And    arformoterol (BROVANA) 15 mcg/2 mL nebulizer solution  15 mcg Nebulization 2x/day    aspirin  (ECOTRIN) enteric coated tablet 81 mg  81 mg Oral Daily    cetirizine (zyrTEC) tablet  10 mg Oral Daily PRN    Correction/SSIP insulin  lispro 100 units/mL injection  3-14 Units Subcutaneous 4x/day AC    cyclobenzaprine  (FLEXERIL ) tablet  10 mg Oral 3x/day PRN    NS 250 mL flush bag   Intravenous Q15 Min PRN    And    D5W 250 mL flush bag   Intravenous Q15 Min PRN    dextrose  (GLUTOSE) 40% oral gel  15 g Oral Q15 Min PRN    dextrose  50% (0.5 g/mL) injection - syringe  12.5 g Intravenous Q15  Min PRN    doxycycline  hyclate (VIBRAMYCIN ) capsule  100 mg Oral 2x/day    enoxaparin  PF (LOVENOX ) 40 mg/0.4 mL SubQ injection  40 mg Subcutaneous Q24H    fluticasone  (FLONASE ) 50 mcg per spray nasal spray  2 Spray Each Nostril Daily    folic acid  (FOLVITE ) tablet  1 mg Oral Daily    glucagon  (GLUCAGEN) injection 1 mg  1 mg IntraMUSCULAR Once PRN    insulin  glargine-yfgn 100 units/mL injection  10 Units Subcutaneous 2x/day    ipratropium-albuterol  0.5 mg-3 mg(2.5 mg base)/3 mL Solution for Nebulization  3 mL Nebulization Q4H PRN    methylPREDNISolone  sod succ (SOLU-medrol ) 40 mg/mL injection  40 mg Intravenous Q6H    metoprolol  succinate (TOPROL -XL) 24 hr extended release tablet  12.5 mg Oral Daily    NS flush syringe  3 mL Intracatheter Q8HRS    NS flush syringe  3 mL Intracatheter Q1H PRN    ondansetron  (ZOFRAN ) 2 mg/mL injection  4 mg Intravenous Q6H PRN    pantoprazole  (PROTONIX ) delayed release tablet  40 mg Oral Daily       Past Surgical History  Past Surgical History:  Procedure Laterality Date    HX CESAREAN SECTION         Family History   Family Medical History:    None       Social History  Social History     Socioeconomic History    Marital status: Married   Tobacco Use    Smoking status: Never    Smokeless tobacco: Never   Vaping Use    Vaping status: Never Used   Substance and Sexual Activity    Alcohol use: Never    Drug use: Never     Social Determinants of Health     Social Connections: Low Risk (02/21/2023)    Social Connections     SDOH Social Isolation: 5 or more times a week        Alcohol use   Social History     Substance and Sexual Activity   Alcohol Use Never       Tobacco use    Tobacco Use History[1]       Imaging:   Results for orders placed or performed during the hospital encounter of 11/15/23 (from the past 24 hours)   XR CHEST PA AND LATERAL     Status: None    Narrative    [name] [name]    XR CHEST PA AND LATERAL 11/15/2023 8:54 PM    CLINICAL HISTORY: dyspnea   Dyspnea    TECHNIQUE:  2 views of the chest were submitted on 2 images.    COMPARISON: November 09, 2023      FINDINGS:       Heart size normal.  No infiltrate, effusion or edema.  No pneumothorax.  No evidence of interstitial disease.  No focal or acute osseous abnormality.        Impression    No radiographic evidence of acute cardiopulmonary process.            Radiologist location ID: [id number]     CT ANGIO CHEST FOR PULMONARY EMBOLUS W IV CONTRAST     Status: None    Narrative    [name] [name]    CT ANGIO CHEST FOR PULMONARY EMBOLUS W IV CONTRAST performed on 11/16/2023 2:30 AM.    INDICATION:  Shortness a breath   Additional History:  dyspnea    TECHNIQUE:  CT angiography of the chest performed for evaluation of the pulmonary arteries, with axial, sagittal, and coronal multiplanar reformations and a coronal 3D MIP.  Dose modulation, automated exposure control, and/or iterative reconstruction were used for dose reduction.    CONTRAST: 100 mL of Isovue 370 IV    COMPARISON:  CT 11/09/2023  ___________________________________  FINDINGS:    HEART / GREAT VESSELS: There are no filling defects in the pulmonary arteries to suggest a pulmonary embolism.  The heart is normal in size without pericardial effusion.  Atherosclerotic calcification of the thoracic aorta without aneurysmal dilation. Mild atherosclerotic calcification of the aortic annulus.SABRA     LUNGS / AIRWAYS: Solid nodule in the right upper lobe adjacent to the fissure measuring approximately 8 x 6 mm similar to 11/09/2023 CT. Adjacent 3 mm. perifissural nodule (series 6, image 23) which may represent lymph node. Elongated left upper lobe groundglass opacity similar to 11/09/2023 CT. Negative for pleural effusion or pneumothorax.  The central airways are patent.    MEDIASTINUM: There is no hilar or mediastinal lymphadenopathy.    UPPER ABDOMEN: Negative for acute abnormality.     BONES:  Degenerative changes of the thoracic spine.SABRA  Negative for acute  abnormality    OTHER:  The visualized thyroid gland is unremarkable.  ___________________________________    Impression    1. Negative for acute pulmonary embolus.  2. Solid pulmonary nodule in the right upper lobe measuring 8 mm. Findings may be further evaluated with PET CT or follow-up CT chest.  3. Similar appearance of elongated mixed solid and groundglass opacity in the left upper lobe which may also be further evaluated with PET CT.      Radiologist location ID: [id number]         Labs:  Results for orders placed or performed during the hospital encounter of 11/15/23 (from the past 24 hours)   POC BLOOD GLUCOSE (RESULTS)   Result Value Ref Range    GLUCOSE, POC 391 (H) 70 - 100 mg/dl   URINALYSIS, MACROSCOPIC   Result Value Ref Range    COLOR Yellow Yellow, Dark Yellow, Light Yellow    APPEARANCE Clear Clear, Slightly Cloudy    PH 5.5 5.0 - 7.0    SPECIFIC GRAVITY 1.032 (H) 1.005 - 1.025    PROTEIN Negative Negative mg/dL    GLUCOSE 3+ (A) Negative mg/dL    KETONES Negative Negative mg/dL    BLOOD Negative Negative, Non-Hemolyzed Trace mg/dL    NITRITE Negative Negative    BILIRUBIN Negative Negative mg/dL    UROBILINOGEN 0.2 0.2 , 1.0 mg/dL    LEUKOCYTES Negative Negative WBCs/uL   URINALYSIS, MICROSCOPIC   Result Value Ref Range    RBCS <2.0 <=3.0 /hpf    WBCS <1.8 <=4.0 /hpf    SQUAMOUS EPITHELIAL CELLS <1.4 0.0 - 10.0 /hpf    TOTAL CASTS <1.4 <=2.0 /lpf    BACTERIA 126.8 (H) <=0 /hpf   ECG 12 LEAD   Result Value Ref Range    Ventricular rate 67 BPM    Atrial Rate 67 BPM    PR Interval 154 ms    QRS Duration 74 ms    QT Interval 386 ms    QTC Calculation 407 ms    Calculated P Axis 66 degrees    Calculated R Axis 17 degrees    Calculated T Axis 49 degrees   BLOOD GAS W/ LACTATE REFLEX Venous   Result Value Ref Range    %FIO2 (VENOUS) 21.0 %    PH (VENOUS) 7.46 (H) 7.32 - 7.43    PCO2 (VENOUS) 37 (L) 41 - 51 mm/Hg    PO2 (VENOUS) 188 35 - 50 mm/Hg    BICARBONATE (VENOUS) 26.9 22.0 - 29.0 mmol/L    BASE  EXCESS 2.5 0.0 - 3.0 mmol/L    O2 SATURATION (VENOUS) 99.7 40.0 - 85.0 %    LACTATE 3.6 (H) <=1.9 mmol/L   COMPREHENSIVE METABOLIC PANEL, NON-FASTING   Result Value Ref Range    SODIUM 135 133 - 144 mmol/L    POTASSIUM 3.8 3.2 - 5.0 mmol/L    CHLORIDE 98 96 - 106 mmol/L    CO2 TOTAL 19 (L) 22 - 30 mmol/L    ANION GAP 18 7 - 18 mmol/L    BUN 18 8 - 23 mg/dL    CREATININE 9.32 9.49 - 0.90 mg/dL    ESTIMATED GFR >09 >09 mL/min/1.13m^2    ALBUMIN 3.6 3.5 - 5.2 g/dL    CALCIUM 8.8 8.3 - 89.2 mg/dL    GLUCOSE 623 (H) 74 - 109 mg/dL    ALKALINE PHOSPHATASE 87 35 - 129 U/L    ALT (SGPT) 24 0 - 33 U/L  AST (SGOT) 19 0 - 32 U/L    BILIRUBIN TOTAL 0.3 0.2 - 1.2 mg/dL    PROTEIN TOTAL 6.0 (L) 6.4 - 8.3 g/dL   TROPONIN-T NOW   Result Value Ref Range    TROPONIN-T 9 <=14 ng/L   BETA-HYDROXYBUTYRATE   Result Value Ref Range    BETA-HYDROXYBUTYRATE 0.19 0.00 - 0.27 mmol/L   NT-PROBNP   Result Value Ref Range    NT-PROBNP 95 0 - 125 pg/mL   CBC WITH DIFF   Result Value Ref Range    WBC 13.4 (H) 3.7 - 11.0 x10^3/uL    RBC 4.81 3.85 - 5.22 x10^6/uL    HGB 13.3 11.5 - 16.0 g/dL    HCT 58.0 65.1 - 53.9 %    MCV 87.1 78.0 - 100.0 fL    MCH 27.7 26.0 - 32.0 pg    MCHC 31.7 31.0 - 35.5 g/dL    RDW-CV 86.0 88.4 - 84.4 %    PLATELETS 303 150 - 400 x10^3/uL    MPV 11.0 8.7 - 12.5 fL    NEUTROPHIL % 73.0 %    LYMPHOCYTE % 19.3 %    MONOCYTE % 5.8 %    EOSINOPHIL % 0.6 %    BASOPHIL % 0.3 %    NEUTROPHIL # 9.79 (H) 1.50 - 7.70 x10^3/uL    LYMPHOCYTE # 2.58 1.00 - 4.80 x10^3/uL    MONOCYTE # 0.78 0.20 - 1.10 x10^3/uL    EOSINOPHIL # <0.10 <=0.50 x10^3/uL    BASOPHIL # <0.10 <=0.20 x10^3/uL    IMMATURE GRANULOCYTE % 1.0 0.0 - 1.0 %    IMMATURE GRANULOCYTE # 0.13 (H) <0.10 x10^3/uL   TROPONIN-T IN TWO HOURS   Result Value Ref Range    TROPONIN-T 9 <=14 ng/L   LACTIC ACID - FIRST REFLEX   Result Value Ref Range    LACTIC ACID 1.8 0.5 - 2.0 mmol/L   D-DIMER   Result Value Ref Range    D-DIMER 0.200 0.000 - 0.520 ug/mL FEU   PROCALCITONIN   Result  Value Ref Range    PROCALCITONIN  0.06 0.00 - 0.50 ng/mL   TROPONIN-T   Result Value Ref Range    TROPONIN-T 15 (H) <=14 ng/L   NT-PROBNP   Result Value Ref Range    NT-PROBNP 102 0 - 125 pg/mL   BASIC METABOLIC PANEL, NON-FASTING   Result Value Ref Range    SODIUM 138 133 - 144 mmol/L    POTASSIUM 3.9 3.2 - 5.0 mmol/L    CHLORIDE 99 96 - 106 mmol/L    CO2 TOTAL 27 22 - 30 mmol/L    ANION GAP 12 7 - 18 mmol/L    CALCIUM 8.9 8.3 - 10.7 mg/dL    GLUCOSE 662 (H) 74 - 109 mg/dL    BUN 23 8 - 23 mg/dL    CREATININE 9.38 9.49 - 0.90 mg/dL    BUN/CREA RATIO 38     ESTIMATED GFR >90 >90 mL/min/1.36m^2   MAGNESIUM    Result Value Ref Range    MAGNESIUM  2.0 1.6 - 2.4 mg/dL   C-REACTIVE PROTEIN(CRP),INFLAMMATION   Result Value Ref Range    C-REACTIVE PROTEIN (CRP) 1.3 (H) 0.0 - 0.9 mg/dL   HEPATIC FUNCTION PANEL   Result Value Ref Range    ALBUMIN 3.5 3.5 - 5.2 g/dL    ALKALINE PHOSPHATASE 82 35 - 129 U/L    ALT (SGPT) 23 0 - 33 U/L    AST (SGOT) 17 0 -  32 U/L    BILIRUBIN TOTAL 0.2 0.2 - 1.2 mg/dL    BILIRUBIN DIRECT <9.7 0.0 - 0.3 mg/dL    PROTEIN TOTAL 5.9 (L) 6.4 - 8.3 g/dL   LIPASE   Result Value Ref Range    LIPASE 31 13 - 60 U/L   POC BLOOD GLUCOSE (RESULTS)   Result Value Ref Range    GLUCOSE, POC 335 (H) 70 - 100 mg/dl   LACTIC ACID LEVEL W/ REFLEX FOR LEVEL >2.0   Result Value Ref Range    LACTIC ACID 1.6 0.5 - 2.0 mmol/L   TROPONIN-T   Result Value Ref Range    TROPONIN-T 10 <=14 ng/L   BASIC METABOLIC PANEL   Result Value Ref Range    SODIUM 135 133 - 144 mmol/L    POTASSIUM 4.1 3.2 - 5.0 mmol/L    CHLORIDE 98 96 - 106 mmol/L    CO2 TOTAL 24 22 - 30 mmol/L    ANION GAP 13 7 - 18 mmol/L    CALCIUM 9.1 8.3 - 10.7 mg/dL    GLUCOSE 629 (H) 74 - 109 mg/dL    BUN 22 8 - 23 mg/dL    CREATININE 9.40 9.49 - 0.90 mg/dL    BUN/CREA RATIO 37     ESTIMATED GFR >90 >90 mL/min/1.66m^2   PHOSPHORUS   Result Value Ref Range    PHOSPHORUS 3.6 2.5 - 4.5 mg/dL   MAGNESIUM    Result Value Ref Range    MAGNESIUM  2.0 1.6 - 2.4 mg/dL   CBC  WITH DIFF   Result Value Ref Range    WBC 11.4 (H) 3.7 - 11.0 x10^3/uL    RBC 4.74 3.85 - 5.22 x10^6/uL    HGB 13.2 11.5 - 16.0 g/dL    HCT 59.0 65.1 - 53.9 %    MCV 86.3 78.0 - 100.0 fL    MCH 27.8 26.0 - 32.0 pg    MCHC 32.3 31.0 - 35.5 g/dL    RDW-CV 86.0 88.4 - 84.4 %    PLATELETS 286 150 - 400 x10^3/uL    MPV 10.6 8.7 - 12.5 fL    NEUTROPHIL % 86.0 %    LYMPHOCYTE % 9.9 %    MONOCYTE % 2.6 %    EOSINOPHIL % 0.3 %    BASOPHIL % 0.3 %    NEUTROPHIL # 9.77 (H) 1.50 - 7.70 x10^3/uL    LYMPHOCYTE # 1.13 1.00 - 4.80 x10^3/uL    MONOCYTE # 0.30 0.20 - 1.10 x10^3/uL    EOSINOPHIL # <0.10 <=0.50 x10^3/uL    BASOPHIL # <0.10 <=0.20 x10^3/uL    IMMATURE GRANULOCYTE % 0.9 0.0 - 1.0 %    IMMATURE GRANULOCYTE # 0.10 (H) <0.10 x10^3/uL   POC BLOOD GLUCOSE (RESULTS)   Result Value Ref Range    GLUCOSE, POC 344 (H) 70 - 100 mg/dl   URINALYSIS, MACRO/MICRO   Result Value Ref Range    COLOR Yellow Yellow, Dark Yellow, Light Yellow    APPEARANCE Clear Clear, Slightly Cloudy    PH 5.5 5.0 - 7.0    SPECIFIC GRAVITY 1.034 (H) 1.005 - 1.025    PROTEIN Negative Negative mg/dL    NITRITE Negative Negative    GLUCOSE 3+ (A) Negative mg/dL    KETONES Negative Negative mg/dL    BLOOD Negative Negative, Non-Hemolyzed Trace mg/dL    BILIRUBIN Negative Negative mg/dL    UROBILINOGEN 0.2 0.2 , 1.0 mg/dL    LEUKOCYTES Negative Negative WBCs/uL  RBCS <2.0 <=3.0 /hpf    WBCS <1.8 <=4.0 /hpf    SQUAMOUS EPITHELIAL CELLS <1.4 0.0 - 10.0 /hpf    TOTAL CASTS <1.4 <=2.0 /lpf    BACTERIA 6.1 (H) <=0 /hpf   POC BLOOD GLUCOSE (RESULTS)   Result Value Ref Range    GLUCOSE, POC 365 (H) 70 - 100 mg/dl   SEDIMENTATION RATE   Result Value Ref Range    ERYTHROCYTE SEDIMENTATION RATE (ESR) 41 (H) 0 - 20 mm/hr   RHEUMATOID FACTOR, SERUM   Result Value Ref Range    RHEUMATOID FACTOR 181 (H) 0 - 14 IU/mL   POC BLOOD GLUCOSE (RESULTS)   Result Value Ref Range    GLUCOSE, POC 335 (H) 70 - 100 mg/dl       Micro:   Hospital Encounter on 11/15/23 (from the past 96  hours)   RESPIRATORY CULTURE AND GRAM STAIN, AEROBIC    Collection Time: 11/16/23 10:25 AM    Specimen: Sputum; Other    Narrative    The following orders were created for panel order RESPIRATORY CULTURE AND GRAM STAIN, AEROBIC.  Procedure                               Abnormality         Status                     ---------                               -----------         ------                     SPUTUM SCREEN[[id number]]                                                                 Please view results for these tests on the individual orders.       Vitals:   Vitals:    11/16/23 0745 11/16/23 0800 11/16/23 0817 11/16/23 1045   BP:  (!) 144/55  (!) 147/68   Pulse:  59  56   Resp:  16  (!) 21   Temp:   36.3 C (97.4 F)    SpO2: 90%   91%   Weight:       Height:       BMI:                 PHYSICAL EXAMINATION:     Constitutional: NAD, Body habitus intact   Eyes: PERRLA, Pink Conjunctiva   Ears, Nose, Mouth, and Throat: No masses, trachea midline, no thyromegaly  Lungs: CTA with normal respiratory effort  CV: RRR, No MRGs, Normal PMI  GI: Soft, non-tender with no HSM  Musc: Normal gait and station, no digital cyanosis  Skin: Warm and Dry, No rash, lesion or ulcers  Psych: AAOx3 with appropriate affect       ASSESSMENT & PLAN:      [name] [name] is a 68 y.o., White female  with a Dx of :    Shortness of breath  Peripheral edema  Lung nodules  Asthma    Recommendation  Given her shortness of breath with uncertain etiology we will check full set PFT's  Await echocardiogram  Continue bronchodilators  We will check interstitial lung disease panel as well  She will need PET scan in a few weeks to reassess lung nodularity and follow up in our office.    Trey Eck, MD  11/16/2023 12:17      The primary team is managing the other comorbidities.       Thank you for the opportunity to assist in the care of this patient.  If you have any questions, please feel free to contact me.      Electronically signed by Trey Eck,  MD    This note may have been partially generated using MModal Fluency Direct system, and there may be some incorrect words, spellings, and punctuation that were not noted in checking the note before saving.         [1]   Social History  Tobacco Use   Smoking Status Never   Smokeless Tobacco Never

## 2023-11-16 NOTE — Care Plan (Signed)
 Problem: Adult Inpatient Plan of Care  Goal: Plan of Care Review  Outcome: Ongoing (see interventions/notes)  Goal: Patient-Specific Goal (Individualized)  Outcome: Ongoing (see interventions/notes)  Goal: Absence of Hospital-Acquired Illness or Injury  Outcome: Ongoing (see interventions/notes)  Goal: Optimal Comfort and Wellbeing  Outcome: Ongoing (see interventions/notes)  Goal: Rounds/Family Conference  Outcome: Ongoing (see interventions/notes)

## 2023-11-16 NOTE — ED Nurses Note (Signed)
 Report given to [facility]

## 2023-11-16 NOTE — Progress Notes (Addendum)
  Medicine [facility] Progress Note    [name] [name], 68 y.o. female            MRN: [id number]   Date of Admission:  11/15/2023  Date of service:  11/16/2023         Room # ED08/ED08              Hospital Day: 1   LOS: 1 day     CC: Dyspnea    Interval History:   68 year old female ex-smoker with asthma/Chronic Obstructive Pulmonary Disease, diabetes and hypertension presented with bilateral lower extremity swelling and uncontrolled blood sugar as well as dyspnea on exertion.  Since admission patient was seen by pulmonologist.  Patient does not need follow up with pulmonology patient uses PRN inhaler.  No history of cardiac failure.  She quit smoking 26 years ago however smoked for 20 years 1 pack per day.    Also complain  lots of snoring never had sleep studies.  CT chest in the past revealed right upper nodularity 6 mm now increased to 8 mm also ground-glass opacity in left side -stable chronic.  Does not use home O2       11/16/2023 : Denies hemoptysis, fever or bleeding manifestation.  No prior history of invasive cardiac workup    Objective:   Filed Vitals:    11/16/23 0745 11/16/23 0800 11/16/23 0817 11/16/23 1045   BP:  (!) 144/55  (!) 147/68   Pulse:  59  56   Resp:  16  (!) 21   Temp:   36.3 C (97.4 F)    SpO2: 90%   91%     Oxygen Device  SpO2: 91 %  Flow (L/min) (Oxygen Therapy): 2  Input/Output  No intake or output data in the 24 hours ending 11/16/23 1704    Follow up Exam:  Body mass index is 33.66 kg/m. ,  obese built,  afebrile,  not dyspneic,  on O2 via nasal cannula 2 L/min.  Short neck  CVS:  S1 S2  regular, distant,   No murmurs, 1+ ankle leg edema   Tele:  Sinus  Res. Sys:  Diminished breath sounds,  No Rales,  No Wheezing.   GI:  Abdomen, obese,  no distension,  no tenderness,  no masses  Mus. Skele. Sys:  1+ pitting pedal and ankle leg Edema,  no Calf tenderness  Skin:  no Cyanosis,  no icterus.  No Rash  CNS:  AA x O3,  no focal deficits,  no tremors  Psych:  Normal  Affect    Labs:  CBC (Last 24 Hours):    Recent Results last 24 hours     11/15/23  2043 11/16/23  0251   WBC 13.4* 11.4*   HGB 13.3 13.2   HCT 41.9 40.9   MCV 87.1 86.3   PLTCNT 303 286       BMP (Last 24 Hours):    Recent Results last 24 hours     11/15/23  2043 11/15/23  2345 11/16/23  0251   SODIUM 135 138 135   POTASSIUM 3.8 3.9 4.1   CHLORIDE 98 99 98   CO2 19* 27 24   BUN 18 23 22    CREATININE 0.67 0.61 0.59   CALCIUM 8.8 8.9 9.1   GLUCOSENF 376* 337* 370*       Results for orders placed or performed during the hospital encounter of 11/15/23 (from the past 24 hours)   POC  BLOOD GLUCOSE (RESULTS)    Collection Time: 11/15/23  8:16 PM   Result Value Ref Range    GLUCOSE, POC 391 (H) 70 - 100 mg/dl   URINALYSIS, MACROSCOPIC AND MICROSCOPIC W/CULTURE REFLEX    Collection Time: 11/15/23  8:29 PM    Specimen: Urine, Clean Catch    Narrative    The following orders were created for panel order URINALYSIS, MACROSCOPIC AND MICROSCOPIC W/CULTURE REFLEX.  Procedure                               Abnormality         Status                     ---------                               -----------         ------                     URINALYSIS, MACROSCOPIC[[id number]]      Abnormal            Final result               URINALYSIS, MICROSCOPIC[[id number]]      Abnormal            Final result                 Please view results for these tests on the individual orders.   URINALYSIS, MACROSCOPIC    Collection Time: 11/15/23  8:29 PM   Result Value Ref Range    COLOR Yellow Yellow, Dark Yellow, Light Yellow    APPEARANCE Clear Clear, Slightly Cloudy    PH 5.5 5.0 - 7.0    SPECIFIC GRAVITY 1.032 (H) 1.005 - 1.025    PROTEIN Negative Negative mg/dL    GLUCOSE 3+ (A) Negative mg/dL    KETONES Negative Negative mg/dL    BLOOD Negative Negative, Non-Hemolyzed Trace mg/dL    NITRITE Negative Negative    BILIRUBIN Negative Negative mg/dL    UROBILINOGEN 0.2 0.2 , 1.0 mg/dL    LEUKOCYTES Negative Negative WBCs/uL   URINALYSIS, MICROSCOPIC     Collection Time: 11/15/23  8:29 PM   Result Value Ref Range    RBCS <2.0 <=3.0 /hpf    WBCS <1.8 <=4.0 /hpf    SQUAMOUS EPITHELIAL CELLS <1.4 0.0 - 10.0 /hpf    TOTAL CASTS <1.4 <=2.0 /lpf    BACTERIA 126.8 (H) <=0 /hpf   CBC/DIFF    Collection Time: 11/15/23  8:43 PM    Narrative    The following orders were created for panel order CBC/DIFF.  Procedure                               Abnormality         Status                     ---------                               -----------         ------  CBC WITH DIFF[[id number]]                Abnormal            Final result                 Please view results for these tests on the individual orders.   BLOOD GAS W/ LACTATE REFLEX Venous    Collection Time: 11/15/23  8:43 PM   Result Value Ref Range    %FIO2 (VENOUS) 21.0 %    PH (VENOUS) 7.46 (H) 7.32 - 7.43    PCO2 (VENOUS) 37 (L) 41 - 51 mm/Hg    PO2 (VENOUS) 188 35 - 50 mm/Hg    BICARBONATE (VENOUS) 26.9 22.0 - 29.0 mmol/L    BASE EXCESS 2.5 0.0 - 3.0 mmol/L    O2 SATURATION (VENOUS) 99.7 40.0 - 85.0 %    LACTATE 3.6 (H) <=1.9 mmol/L    Narrative    Manufacturer does not recommend venous sample for assessment of patient oxygenation status. A reference range for pO2, Oxyhemoglobin and Oxygen Saturation is provided but abnormal results will not flag in Epic.   COMPREHENSIVE METABOLIC PANEL, NON-FASTING    Collection Time: 11/15/23  8:43 PM   Result Value Ref Range    SODIUM 135 133 - 144 mmol/L    POTASSIUM 3.8 3.2 - 5.0 mmol/L    CHLORIDE 98 96 - 106 mmol/L    CO2 TOTAL 19 (L) 22 - 30 mmol/L    ANION GAP 18 7 - 18 mmol/L    BUN 18 8 - 23 mg/dL    CREATININE 9.32 9.49 - 0.90 mg/dL    ESTIMATED GFR >09 >09 mL/min/1.66m^2    ALBUMIN 3.6 3.5 - 5.2 g/dL    CALCIUM 8.8 8.3 - 89.2 mg/dL    GLUCOSE 623 (H) 74 - 109 mg/dL    ALKALINE PHOSPHATASE 87 35 - 129 U/L    ALT (SGPT) 24 0 - 33 U/L    AST (SGOT) 19 0 - 32 U/L    BILIRUBIN TOTAL 0.3 0.2 - 1.2 mg/dL    PROTEIN TOTAL 6.0 (L) 6.4 - 8.3 g/dL   TROPONIN-T  NOW    Collection Time: 11/15/23  8:43 PM   Result Value Ref Range    TROPONIN-T 9 <=14 ng/L    Narrative    In order to distinguish acute elevations of high sensitive Troponin from other clinical conditions, the Universal Definition of myocardial infarction stresses clinical assessment and the need for serial measurements to observe a rise and/or fall above the upper limit of the reference interval.    BETA-HYDROXYBUTYRATE    Collection Time: 11/15/23  8:43 PM   Result Value Ref Range    BETA-HYDROXYBUTYRATE 0.19 0.00 - 0.27 mmol/L   NT-PROBNP    Collection Time: 11/15/23  8:43 PM   Result Value Ref Range    NT-PROBNP 95 0 - 125 pg/mL   CBC WITH DIFF    Collection Time: 11/15/23  8:43 PM   Result Value Ref Range    WBC 13.4 (H) 3.7 - 11.0 x10^3/uL    RBC 4.81 3.85 - 5.22 x10^6/uL    HGB 13.3 11.5 - 16.0 g/dL    HCT 58.0 65.1 - 53.9 %    MCV 87.1 78.0 - 100.0 fL    MCH 27.7 26.0 - 32.0 pg    MCHC 31.7 31.0 - 35.5 g/dL    RDW-CV 86.0 88.4 - 84.4 %    PLATELETS 303  150 - 400 x10^3/uL    MPV 11.0 8.7 - 12.5 fL    NEUTROPHIL % 73.0 %    LYMPHOCYTE % 19.3 %    MONOCYTE % 5.8 %    EOSINOPHIL % 0.6 %    BASOPHIL % 0.3 %    NEUTROPHIL # 9.79 (H) 1.50 - 7.70 x10^3/uL    LYMPHOCYTE # 2.58 1.00 - 4.80 x10^3/uL    MONOCYTE # 0.78 0.20 - 1.10 x10^3/uL    EOSINOPHIL # <0.10 <=0.50 x10^3/uL    BASOPHIL # <0.10 <=0.20 x10^3/uL    IMMATURE GRANULOCYTE % 1.0 0.0 - 1.0 %    IMMATURE GRANULOCYTE # 0.13 (H) <0.10 x10^3/uL   TROPONIN-T IN TWO HOURS    Collection Time: 11/15/23 10:48 PM   Result Value Ref Range    TROPONIN-T 9 <=14 ng/L    Narrative    In order to distinguish acute elevations of high sensitive Troponin from other clinical conditions, the Universal Definition of myocardial infarction stresses clinical assessment and the need for serial measurements to observe a rise and/or fall above the upper limit of the reference interval.    LACTIC ACID - FIRST REFLEX    Collection Time: 11/15/23 10:48 PM   Result Value Ref Range     LACTIC ACID 1.8 0.5 - 2.0 mmol/L   URINALYSIS WITH REFLEX MICROSCOPIC AND CULTURE IF POSITIVE *Canceled*    Collection Time: 11/15/23 11:26 PM    Specimen: Urine, Clean Catch    Narrative    The following orders were created for panel order URINALYSIS WITH REFLEX MICROSCOPIC AND CULTURE IF POSITIVE.  Procedure                               Abnormality         Status                     ---------                               -----------         ------                       Please view results for these tests on the individual orders.   CBC/DIFF *Canceled*    Collection Time: 11/15/23 11:31 PM    Narrative    The following orders were created for panel order CBC/DIFF.  Procedure                               Abnormality         Status                     ---------                               -----------         ------                       Please view results for these tests on the individual orders.   D-DIMER    Collection Time: 11/15/23 11:45 PM   Result Value Ref Range    D-DIMER 0.200 0.000 - 0.520 ug/mL FEU  Narrative    NEGATIVE PREDICTIVE VALUE:   > OR = TO 0.530 ug/mL FEU ----- Positive  < 0.520 ug/mL FEU ------------- Negative     The intended use of D-Dimer is in conjunction with a non-high clinical pre-test probability (PTP) assessment model to exclude deep vein thrombosis (DVT) and pulmondary embolism (PE).      PROCALCITONIN    Collection Time: 11/15/23 11:45 PM   Result Value Ref Range    PROCALCITONIN  0.06 0.00 - 0.50 ng/mL   TROPONIN-T    Collection Time: 11/15/23 11:45 PM   Result Value Ref Range    TROPONIN-T 15 (H) <=14 ng/L    Narrative    In order to distinguish acute elevations of high sensitive Troponin from other clinical conditions, the Universal Definition of myocardial infarction stresses clinical assessment and the need for serial measurements to observe a rise and/or fall above the upper limit of the reference interval.    NT-PROBNP    Collection Time: 11/15/23 11:45 PM   Result  Value Ref Range    NT-PROBNP 102 0 - 125 pg/mL   BASIC METABOLIC PANEL, NON-FASTING    Collection Time: 11/15/23 11:45 PM   Result Value Ref Range    SODIUM 138 133 - 144 mmol/L    POTASSIUM 3.9 3.2 - 5.0 mmol/L    CHLORIDE 99 96 - 106 mmol/L    CO2 TOTAL 27 22 - 30 mmol/L    ANION GAP 12 7 - 18 mmol/L    CALCIUM 8.9 8.3 - 10.7 mg/dL    GLUCOSE 662 (H) 74 - 109 mg/dL    BUN 23 8 - 23 mg/dL    CREATININE 9.38 9.49 - 0.90 mg/dL    BUN/CREA RATIO 38     ESTIMATED GFR >90 >90 mL/min/1.42m^2   MAGNESIUM     Collection Time: 11/15/23 11:45 PM   Result Value Ref Range    MAGNESIUM  2.0 1.6 - 2.4 mg/dL   C-REACTIVE PROTEIN(CRP),INFLAMMATION    Collection Time: 11/15/23 11:45 PM   Result Value Ref Range    C-REACTIVE PROTEIN (CRP) 1.3 (H) 0.0 - 0.9 mg/dL   HEPATIC FUNCTION PANEL    Collection Time: 11/15/23 11:45 PM   Result Value Ref Range    ALBUMIN 3.5 3.5 - 5.2 g/dL    ALKALINE PHOSPHATASE 82 35 - 129 U/L    ALT (SGPT) 23 0 - 33 U/L    AST (SGOT) 17 0 - 32 U/L    BILIRUBIN TOTAL 0.2 0.2 - 1.2 mg/dL    BILIRUBIN DIRECT <9.7 0.0 - 0.3 mg/dL    PROTEIN TOTAL 5.9 (L) 6.4 - 8.3 g/dL   LIPASE    Collection Time: 11/15/23 11:45 PM   Result Value Ref Range    LIPASE 31 13 - 60 U/L   POC BLOOD GLUCOSE (RESULTS)    Collection Time: 11/16/23 12:11 AM   Result Value Ref Range    GLUCOSE, POC 335 (H) 70 - 100 mg/dl   CBC/DIFF *Canceled*    Collection Time: 11/16/23 12:16 AM    Narrative    The following orders were created for panel order CBC/DIFF.  Procedure                               Abnormality         Status                     ---------                               -----------         ------  CBC WITH DIFF[[id number]]                                                                 Please view results for these tests on the individual orders.   LACTIC ACID LEVEL W/ REFLEX FOR LEVEL >2.0    Collection Time: 11/16/23  2:51 AM   Result Value Ref Range    LACTIC ACID 1.6 0.5 - 2.0 mmol/L   TROPONIN-T    Collection  Time: 11/16/23  2:51 AM   Result Value Ref Range    TROPONIN-T 10 <=14 ng/L    Narrative    In order to distinguish acute elevations of high sensitive Troponin from other clinical conditions, the Universal Definition of myocardial infarction stresses clinical assessment and the need for serial measurements to observe a rise and/or fall above the upper limit of the reference interval.    CBC/DIFF - AM ONCE    Collection Time: 11/16/23  2:51 AM    Narrative    The following orders were created for panel order CBC/DIFF - AM ONCE.  Procedure                               Abnormality         Status                     ---------                               -----------         ------                     CBC WITH DIFF[[id number]]                Abnormal            Final result                 Please view results for these tests on the individual orders.   BASIC METABOLIC PANEL    Collection Time: 11/16/23  2:51 AM   Result Value Ref Range    SODIUM 135 133 - 144 mmol/L    POTASSIUM 4.1 3.2 - 5.0 mmol/L    CHLORIDE 98 96 - 106 mmol/L    CO2 TOTAL 24 22 - 30 mmol/L    ANION GAP 13 7 - 18 mmol/L    CALCIUM 9.1 8.3 - 10.7 mg/dL    GLUCOSE 629 (H) 74 - 109 mg/dL    BUN 22 8 - 23 mg/dL    CREATININE 9.40 9.49 - 0.90 mg/dL    BUN/CREA RATIO 37     ESTIMATED GFR >90 >90 mL/min/1.46m^2   PHOSPHORUS    Collection Time: 11/16/23  2:51 AM   Result Value Ref Range    PHOSPHORUS 3.6 2.5 - 4.5 mg/dL   MAGNESIUM     Collection Time: 11/16/23  2:51 AM   Result Value Ref Range    MAGNESIUM  2.0 1.6 - 2.4 mg/dL   CBC WITH DIFF    Collection Time: 11/16/23  2:51 AM   Result Value Ref Range  WBC 11.4 (H) 3.7 - 11.0 x10^3/uL    RBC 4.74 3.85 - 5.22 x10^6/uL    HGB 13.2 11.5 - 16.0 g/dL    HCT 59.0 65.1 - 53.9 %    MCV 86.3 78.0 - 100.0 fL    MCH 27.8 26.0 - 32.0 pg    MCHC 32.3 31.0 - 35.5 g/dL    RDW-CV 86.0 88.4 - 84.4 %    PLATELETS 286 150 - 400 x10^3/uL    MPV 10.6 8.7 - 12.5 fL    NEUTROPHIL % 86.0 %    LYMPHOCYTE % 9.9 %    MONOCYTE %  2.6 %    EOSINOPHIL % 0.3 %    BASOPHIL % 0.3 %    NEUTROPHIL # 9.77 (H) 1.50 - 7.70 x10^3/uL    LYMPHOCYTE # 1.13 1.00 - 4.80 x10^3/uL    MONOCYTE # 0.30 0.20 - 1.10 x10^3/uL    EOSINOPHIL # <0.10 <=0.50 x10^3/uL    BASOPHIL # <0.10 <=0.20 x10^3/uL    IMMATURE GRANULOCYTE % 0.9 0.0 - 1.0 %    IMMATURE GRANULOCYTE # 0.10 (H) <0.10 x10^3/uL   POC BLOOD GLUCOSE (RESULTS)    Collection Time: 11/16/23  5:14 AM   Result Value Ref Range    GLUCOSE, POC 344 (H) 70 - 100 mg/dl   URINALYSIS WITH REFLEX MICROSCOPIC AND CULTURE IF POSITIVE    Collection Time: 11/16/23  5:27 AM    Specimen: Urine, Clean Catch    Narrative    The following orders were created for panel order URINALYSIS WITH REFLEX MICROSCOPIC AND CULTURE IF POSITIVE.  Procedure                               Abnormality         Status                     ---------                               -----------         ------                     URINALYSIS, MACRO/MICRO[[id number]]      Abnormal            Final result                 Please view results for these tests on the individual orders.   URINALYSIS, MACRO/MICRO    Collection Time: 11/16/23  5:27 AM   Result Value Ref Range    COLOR Yellow Yellow, Dark Yellow, Light Yellow    APPEARANCE Clear Clear, Slightly Cloudy    PH 5.5 5.0 - 7.0    SPECIFIC GRAVITY 1.034 (H) 1.005 - 1.025    PROTEIN Negative Negative mg/dL    NITRITE Negative Negative    GLUCOSE 3+ (A) Negative mg/dL    KETONES Negative Negative mg/dL    BLOOD Negative Negative, Non-Hemolyzed Trace mg/dL    BILIRUBIN Negative Negative mg/dL    UROBILINOGEN 0.2 0.2 , 1.0 mg/dL    LEUKOCYTES Negative Negative WBCs/uL    RBCS <2.0 <=3.0 /hpf    WBCS <1.8 <=4.0 /hpf    SQUAMOUS EPITHELIAL CELLS <1.4 0.0 - 10.0 /hpf    TOTAL CASTS <1.4 <=2.0 /lpf    BACTERIA 6.1 (H) <=0 /hpf  POC BLOOD GLUCOSE (RESULTS)    Collection Time: 11/16/23  7:46 AM   Result Value Ref Range    GLUCOSE, POC 365 (H) 70 - 100 mg/dl   RESPIRATORY CULTURE AND GRAM STAIN, AEROBIC     Collection Time: 11/16/23 10:25 AM    Specimen: Sputum; Other    Narrative    The following orders were created for panel order RESPIRATORY CULTURE AND GRAM STAIN, AEROBIC.  Procedure                               Abnormality         Status                     ---------                               -----------         ------                     SPUTUM SCREEN[[id number]]                                                                 Please view results for these tests on the individual orders.   ANCA VASCULITIS PANEL, SERUM    Collection Time: 11/16/23 10:41 AM    Narrative    The following orders were created for panel order ANCA VASCULITIS PANEL, SERUM.  Procedure                               Abnormality         Status                     ---------                               -----------         ------                     [employer].SABRASABRA[[id number]]                      In process                 PROTEINASE 3 ANTIBODIES,.SABRASABRA[[id number]]                      In process                   Please view results for these tests on the individual orders.   SEDIMENTATION RATE    Collection Time: 11/16/23 10:41 AM   Result Value Ref Range    ERYTHROCYTE SEDIMENTATION RATE (ESR) 41 (H) 0 - 20 mm/hr   RHEUMATOID FACTOR, SERUM    Collection Time: 11/16/23 10:41 AM   Result Value Ref Range    RHEUMATOID FACTOR 181 (H) 0 - 14 IU/mL   POC BLOOD GLUCOSE (RESULTS)    Collection Time: 11/16/23 11:13 AM  Result Value Ref Range    GLUCOSE, POC 335 (H) 70 - 100 mg/dl   POC BLOOD GLUCOSE (RESULTS)    Collection Time: 11/16/23  4:36 PM   Result Value Ref Range    GLUCOSE, POC 404 (H) 70 - 100 mg/dl      Micro:   Hospital Encounter on 11/15/23 (from the past 96 hours)   RESPIRATORY CULTURE AND GRAM STAIN, AEROBIC    Collection Time: 11/16/23 10:25 AM    Specimen: Sputum; Other    Narrative    The following orders were created for panel order RESPIRATORY CULTURE AND GRAM STAIN, AEROBIC.  Procedure                                Abnormality         Status                     ---------                               -----------         ------                     SPUTUM SCREEN[[id number]]                                                                 Please view results for these tests on the individual orders.     No results found for the last 90 days.     Imaging Studies:     No results found.      Assessment & Plan  Dyspnea  Dependent edema  Exertional shortness of breath  Troponin T within normal limit including BNP.    Echo revealed LVEF 60-65%.  Moderate LVH.  no significant valvular heart disease.    CTA angio chest no PE  Dependent edema could be due to hypertensive heart disease  Acute hypoxic respiratory failure (CMS HCC)  Now needing O2 via nasal cannula  PFT studies report pending  Uncomplicated asthma, unspecified asthma severity, unspecified whether persistent  Chronic bronchitis  Had mild leukocytosis without obvious lung exam suggesting wheezing when patient is seen  ESR 41.  D-dimer 0.2.  Procalcitonin 0.06.  CRP 1.3.  ANA screening pending.  Continue DuoNeb PRN  Long-acting beta agonist and inhaled steroids.  Doxycycline  b.i.d.  Decrease steroids  Lung nodules  Right upper lobe 8 mm pulmonary nodule Pulmonologist evaluated.  check interstitial lung disease panel as well  She will need PET scan in a few weeks to reassess lung nodularity and follow up .  Snoring  Outpatient sleep studies.  Probably has untreated sleep apnea  Diabetes mellitus type 2, insulin  dependent (CMS HCC)  Uncontrolled with hyperglycemia.  Increase doses of Lantus  and sliding scale ADA diet  Hypertensive heart disease without heart failure  Primary hypertension  With echo evidence of LVH  Continue Toprol , Norvasc  and lisinopril   Gastroesophageal reflux disease without esophagitis  Continue PPI     Antibiotics (From admission, onward)      Start     Stop Route Frequency    11/16/23  1100  doxycycline  hyclate (VIBRAMYCIN ) capsule         -- Oral 2 TIMES  DAILY          Based on the clinical judgment and patient's condition, inpatient admission is deemed necessary to provide appropriate care and prevent further complications.  DVT/PE Prophylaxis: Enoxaparin   Current active medications:  acetaminophen  (TYLENOL ) tablet, 650 mg, Oral, Q4H PRN  amLODIPine  (NORVASC ) tablet, 10 mg, Oral, Daily   And  lisinopril  (PRINIVIL ) tablet, 40 mg, Oral, Daily  budesonide  (PULMICORT  RESPULES) 0.5 mg/2 mL nebulizer suspension, 0.5 mg, Nebulization, 2x/day   And  arformoterol (BROVANA) 15 mcg/2 mL nebulizer solution, 15 mcg, Nebulization, 2x/day  aspirin  (ECOTRIN) enteric coated tablet 81 mg, 81 mg, Oral, Daily  cetirizine (zyrTEC) tablet, 10 mg, Oral, Daily PRN  Correction/SSIP insulin  lispro 100 units/mL injection, 3-14 Units, Subcutaneous, 4x/day AC  cyclobenzaprine  (FLEXERIL ) tablet, 10 mg, Oral, 3x/day PRN  NS 250 mL flush bag, , Intravenous, Q15 Min PRN   And  D5W 250 mL flush bag, , Intravenous, Q15 Min PRN  dextrose  (GLUTOSE) 40% oral gel, 15 g, Oral, Q15 Min PRN  dextrose  50% (0.5 g/mL) injection - syringe, 12.5 g, Intravenous, Q15 Min PRN  doxycycline  hyclate (VIBRAMYCIN ) capsule, 100 mg, Oral, 2x/day  enoxaparin  PF (LOVENOX ) 40 mg/0.4 mL SubQ injection, 40 mg, Subcutaneous, Q24H  fluticasone  (FLONASE ) 50 mcg per spray nasal spray, 2 Spray, Each Nostril, Daily  folic acid  (FOLVITE ) tablet, 1 mg, Oral, Daily  glucagon  (GLUCAGEN) injection 1 mg, 1 mg, IntraMUSCULAR, Once PRN  insulin  glargine-yfgn 100 units/mL injection, 10 Units, Subcutaneous, 2x/day  ipratropium-albuterol  0.5 mg-3 mg(2.5 mg base)/3 mL Solution for Nebulization, 3 mL, Nebulization, Q4H PRN  methylPREDNISolone  sod succ (SOLU-medrol ) 40 mg/mL injection, 40 mg, Intravenous, Q6H  metoprolol  succinate (TOPROL -XL) 24 hr extended release tablet, 12.5 mg, Oral, Daily  NS flush syringe, 3 mL, Intracatheter, Q8HRS  NS flush syringe, 3 mL, Intracatheter, Q1H PRN  ondansetron  (ZOFRAN ) 2 mg/mL injection, 4 mg, Intravenous,  Q6H PRN  pantoprazole  (PROTONIX ) delayed release tablet, 40 mg, Oral, Daily    [x]  I have independently reviewed and interpreted the patient's vitals, laboratory and imaging data, Medications, and physician's notes; and made appropriate changes in the orders and management plan.   [x]  I have discussed plausible action plan with the patient  and the RN.        Natelie Ostrosky, MD.

## 2023-11-16 NOTE — ED Nurses Note (Signed)
 Family at bedside brought pt [employer].  Pt sitting in bed eating at this time.

## 2023-11-17 DIAGNOSIS — Z794 Long term (current) use of insulin: Secondary | ICD-10-CM | POA: Insufficient documentation

## 2023-11-17 DIAGNOSIS — R002 Palpitations: Secondary | ICD-10-CM

## 2023-11-17 DIAGNOSIS — I119 Hypertensive heart disease without heart failure: Secondary | ICD-10-CM | POA: Insufficient documentation

## 2023-11-17 DIAGNOSIS — R609 Edema, unspecified: Secondary | ICD-10-CM

## 2023-11-17 DIAGNOSIS — E119 Type 2 diabetes mellitus without complications: Secondary | ICD-10-CM

## 2023-11-17 DIAGNOSIS — J9601 Acute respiratory failure with hypoxia: Secondary | ICD-10-CM | POA: Insufficient documentation

## 2023-11-17 DIAGNOSIS — K219 Gastro-esophageal reflux disease without esophagitis: Secondary | ICD-10-CM | POA: Insufficient documentation

## 2023-11-17 DIAGNOSIS — I10 Essential (primary) hypertension: Secondary | ICD-10-CM | POA: Insufficient documentation

## 2023-11-17 DIAGNOSIS — R9439 Abnormal result of other cardiovascular function study: Secondary | ICD-10-CM

## 2023-11-17 DIAGNOSIS — J42 Unspecified chronic bronchitis: Secondary | ICD-10-CM | POA: Insufficient documentation

## 2023-11-17 DIAGNOSIS — J45909 Unspecified asthma, uncomplicated: Secondary | ICD-10-CM

## 2023-11-17 DIAGNOSIS — R0683 Snoring: Secondary | ICD-10-CM | POA: Insufficient documentation

## 2023-11-17 LAB — RNP ANTIBODIES, IGG, SERUM
RNP ANTIBODIES, IGG, QUALITATIVE: NEGATIVE
RNP ANTIBODIES, IGG, QUANTITATIVE: <0.2 [AU]/ml (ref <1.0)

## 2023-11-17 LAB — CYCLIC CITRULLINATED PEPTIDE ANTIBODIES, IGG, SERUM
CYCLIC CITRULLINATED PEPTIDE ANTIBODY IGG QUAL: NEGATIVE
CYCLIC CITRULLINATED PEPTIDE ANTIBODY IGG QUANT: <0.5 U/mL (ref <3.0)

## 2023-11-17 LAB — BASIC METABOLIC PANEL
ANION GAP: 12 mmol/L (ref 7–18)
BUN/CREA RATIO: 40
BUN: 23 mg/dL (ref 8–23)
CALCIUM: 8.9 mg/dL (ref 8.3–10.7)
CHLORIDE: 101 mmol/L (ref 96–106)
CO2 TOTAL: 23 mmol/L (ref 22–30)
CREATININE: 0.58 mg/dL (ref 0.50–0.90)
ESTIMATED GFR: >90 mL/min/1.73mˆ2 (ref >90)
GLUCOSE: 367 mg/dL — ABNORMAL HIGH (ref 74–109)
POTASSIUM: 4 mmol/L (ref 3.2–5.0)
SODIUM: 136 mmol/L (ref 133–144)

## 2023-11-17 LAB — POC BLOOD GLUCOSE (RESULTS)
GLUCOSE, POC: 386 mg/dL — ABNORMAL HIGH (ref 70–100)
GLUCOSE, POC: 336 mg/dL — ABNORMAL HIGH (ref 70–100)
GLUCOSE, POC: 274 mg/dL — ABNORMAL HIGH (ref 70–100)
GLUCOSE, POC: 275 mg/dL — ABNORMAL HIGH (ref 70–100)

## 2023-11-17 LAB — ANA SCREEN WITH REFLEX: ANA SCREEN: NEGATIVE

## 2023-11-17 LAB — SS-B/LA ANTIBODIES, IGG, SERUM: SS-B/LA ANTIBODIES, IGG, QUALITATIVE: NEGATIVE

## 2023-11-17 LAB — HGA1C (HEMOGLOBIN A1C WITH EST AVG GLUCOSE)
ESTIMATED AVERAGE GLUCOSE: 232 mg/dL
HEMOGLOBIN A1C: 9.7 % — ABNORMAL HIGH (ref 4.0–6.0)

## 2023-11-17 LAB — ANTI-DOUBLE STRANDED DNA AB: ANTI DNA ANTIBODY: NEGATIVE

## 2023-11-17 LAB — URINE CULTURE: URINE CULTURE: 15000

## 2023-11-17 LAB — SS-A/RO ANTIBODIES, IGG, SERUM: SS-A/RO, ANTIBODIES, IGG QUALITATIVE: NEGATIVE

## 2023-11-17 MED ORDER — FENOFIBRATE NANOCRYSTALLIZED 145 MG TABLET
145.0000 mg | ORAL_TABLET | Freq: Every morning | ORAL | Status: DC
Start: 2023-11-17 — End: 2023-11-21
  Administered 2023-11-17 – 2023-11-21 (×5): 145 mg via ORAL
  Filled 2023-11-17 (×6): qty 1

## 2023-11-17 MED ORDER — INSULIN GLARGINE-YFGN (U-100) 100 UNIT/ML SUBQ - CHARGE BY DOSE
20.0000 [IU] | Freq: Two times a day (BID) | SUBCUTANEOUS | Status: DC
Start: 2023-11-17 — End: 2023-11-21
  Administered 2023-11-17 – 2023-11-21 (×9): 20 [IU] via SUBCUTANEOUS
  Filled 2023-11-17 (×11): qty 20

## 2023-11-17 MED ORDER — HYDROCHLOROTHIAZIDE 25 MG TABLET
12.5000 mg | ORAL_TABLET | Freq: Every day | ORAL | Status: DC
Start: 2023-11-17 — End: 2023-11-19
  Administered 2023-11-17: 0 mg via ORAL
  Administered 2023-11-18 – 2023-11-19 (×2): 12.5 mg via ORAL
  Filled 2023-11-17 (×5): qty 0.5

## 2023-11-17 MED ORDER — METHYLPREDNISOLONE SOD SUCCINATE 40 MG/ML SOLUTION FOR INJ. WRAPPER
40.0000 mg | Freq: Every day | INTRAMUSCULAR | Status: DC
Start: 2023-11-18 — End: 2023-11-17

## 2023-11-17 MED ORDER — CARVEDILOL 6.25 MG TABLET
3.1250 mg | ORAL_TABLET | Freq: Two times a day (BID) | ORAL | Status: DC
Start: 2023-11-17 — End: 2023-11-21
  Administered 2023-11-17 – 2023-11-18 (×3): 0 mg via ORAL
  Administered 2023-11-19: 3.125 mg via ORAL
  Filled 2023-11-17: qty 1

## 2023-11-17 MED ORDER — DEXTROMETHORPHAN-GUAIFENESIN 30 MG-600 MG TABLET EXTENDED RELEASE12 HR
1.0000 | ORAL_TABLET | Freq: Two times a day (BID) | ORAL | Status: DC
Start: 2023-11-18 — End: 2023-11-18
  Administered 2023-11-18 (×2): 1 via ORAL
  Filled 2023-11-17 (×3): qty 1

## 2023-11-17 MED ORDER — INSULIN LISPRO 100 UNIT/ML SUB-Q SSIP - CHARGE BY DOSE
4.0000 [IU] | INJECTION | Freq: Four times a day (QID) | SUBCUTANEOUS | Status: DC
Start: 2023-11-17 — End: 2023-11-21
  Administered 2023-11-17: 10 [IU] via SUBCUTANEOUS
  Administered 2023-11-17: 14 [IU] via SUBCUTANEOUS
  Administered 2023-11-17: 10 [IU] via SUBCUTANEOUS
  Administered 2023-11-17: 18 [IU] via SUBCUTANEOUS
  Administered 2023-11-18 (×2): 10 [IU] via SUBCUTANEOUS
  Administered 2023-11-18: 4 [IU] via SUBCUTANEOUS
  Administered 2023-11-18 – 2023-11-19 (×2): 10 [IU] via SUBCUTANEOUS
  Administered 2023-11-19: 4 [IU] via SUBCUTANEOUS
  Administered 2023-11-19: 10 [IU] via SUBCUTANEOUS
  Administered 2023-11-19: 4 [IU] via SUBCUTANEOUS
  Administered 2023-11-20: 7 [IU] via SUBCUTANEOUS
  Administered 2023-11-20: 0 [IU] via SUBCUTANEOUS
  Administered 2023-11-20: 4 [IU] via SUBCUTANEOUS
  Administered 2023-11-20 – 2023-11-21 (×2): 7 [IU] via SUBCUTANEOUS

## 2023-11-17 NOTE — Assessment & Plan Note (Addendum)
 Exertional shortness of breath likely multifactorial.  Troponin T within normal limit including BNP.    Echo revealed LVEF 60-65%.  Moderate LVH.  no significant valvular heart disease.    CTA angio chest no PE  Dependent edema could be due to hypertensive heart disease possibly secondary to Norvasc .  Evaluated by cardiologist for elevated RVSP.    However Echocardiogram showed estimated RVSP to be at upper limits of normal range with normal left ventricular systolic function.   Coreg added instead of metoprolol  for better blood pressure control  Norvasc  discontinued due to ankle edema

## 2023-11-17 NOTE — Assessment & Plan Note (Signed)
 Uncontrolled with hyperglycemia.  Increase doses of Lantus  and sliding scale ADA diet

## 2023-11-17 NOTE — Assessment & Plan Note (Addendum)
 Now needing O2 via nasal cannula.  Oxygen has been weaned off  PFT studies report no significant obstruction restrictive pattern  Rheumatologist consulted.   Latest Reference Range & Units 11/16/23 10:41   ANTI-DNA AB Negative  Negative   CYCLIC CITRULLINATED PEPTIDE ANTIBODY IGG  <3.0 U/mL <0.5   CYCLIC CITRULLINATED PEPTIDE ANTIBODY IGG QUAL Negative  Negative   ANA SCREEN WITH REFLEX  Rpt   ANA SCREEN Negative  Negative   RNP ANTIBODIES, IGG, QUALITATIVE Negative  Negative   RNP ANTIBODIES, IGG, QUANTITATIVE <1.0 AAU/mL <0.2   SS-A/RO ANTIBODIES, IGG, QUALITATIVE Negative  Negative   SS-B/LA ANTIBODIES, IGG, QUALITATIVE Negative  Negative      Latest Reference Range & Units 11/16/23 10:41   RHEUMATOID FACTOR 0 - 14 IU/mL 181 (H)   RHEUMATOID FACTOR, SERUM  Rpt !

## 2023-11-17 NOTE — Assessment & Plan Note (Addendum)
 Right upper lobe 8 mm pulmonary nodule Pulmonologist evaluated.  check interstitial lung disease panel as well  She will need PET scan in a few weeks to reassess lung nodularity and follow up .

## 2023-11-17 NOTE — Consults (Signed)
 [facility]         Chief Complaint: Hyperglycemia (Pt to ED c/o high blood sugar. Pt states that her sugar was 500 last night at home. Pt states that she checked her sugar today around noon and her glucose was 248.  ), Hypertension (States that today around 14:30 her blood pressure was 190 something and was given an emergency blood pressure pill by her PCP today. Pt c/o a headache. Blood pressure in triage is 157/67), and Leg Swelling (Pt c/o bilateral leg swelling that started 2 weeks ago, pt was started on lasix. Pt states that her legs feels tight and itchy. No redness noted. Pt also states that her lactic acid has been high was recently seen at [facility] ED and discharged. )      History of Present Illness   [name] [name] is a 68 y.o. White female who presents presents to [facility] with worsening bilateral lower extremity edema and dyspnea.  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  Cardiology was consulted secondary to palpitations, bilateral lower extremity edema and hypertension.  On rounds patient was sleeping comfortably easily awakened to voice.  Family was present at bedside.  She denies any anginal equivalent complaints does have bilateral lower extremity edema.  Echocardiogram has been performed this admission please see below for full findings.  She reports that she recently has been evaluated by her primary care provider who decreased her beta-blocker therapy secondary to bradycardia.  Also reports that she has been on several antihypertensive medications in a long-term including Ace and calcium channel blocker combination.  Workup has demonstrated elevated hemoglobin A1c, normal creatinine 12 lead EKG with no acute ischemic changes.    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    Gastroesophageal reflux disease without esophagitis 11/17/2023    Primary hypertension 11/17/2023    Dependent edema 11/16/2023    Uncomplicated asthma, unspecified asthma severity, unspecified whether persistent 11/16/2023    Lung nodules 11/16/2023    Dyspnea 11/15/2023    Sepsis 02/21/2023    Chest pain 02/23/2021    Breast pain, left 02/23/2021    Breast pain, right 02/23/2021    Family history of breast cancer 02/23/2021       Allergies  Allergies[1]    Medications  Current Medications[2]    Social History:  Social History     Socioeconomic History    Marital status: Married     Spouse name: Not on file    Number of children: Not on file    Years of education: Not on file    Highest education level: Not on file   Occupational History    Not on file   Tobacco Use    Smoking status: Never    Smokeless tobacco: Never   Vaping Use    Vaping status: Never Used   Substance and Sexual Activity    Alcohol use: Never    Drug use: Never    Sexual activity: Not on file   Other Topics Concern    Not on file   Social History Narrative    Not on file  Social Determinants of Health     Financial Resource Strain: Not on file   Transportation Needs: Not on file   Social Connections: Low Risk (11/16/2023)    Social Connections     SDOH Social Isolation: 5 or more times a week   Intimate Partner Violence: Not on file   Housing Stability: Not on file        Medical History:  Past Medical History:   Diagnosis Date    Asthma     Diabetes mellitus, type 2     HTN (hypertension)         Family History:  Family Medical History:    None            Review of Systems:  Constitutional: No weight loss or fatigue  Eye: No blurry vision  ENMT: No hearing loss or dizziness  Respiratory: No congestive heart failure, emphysema, COPD or black lung  Cardiovascular: As per HPI. No  claudication or varicose veins  Gastrointestinal: No nausea, reflux, black or bloody bowel movements  Endocrine: No thyroid problems or diabetes  Musculoskeletal: No arthritis, gout or osteoporosis  Integumentary: No rashes, hives, eczema or psoriasis  Neurologic: No strokes or seizures  Psychiatric: No anxiety, depression or insomn      Physical Examination:  BP 119/63   Pulse 59   Temp 36.7 C (98.1 F)   Resp 18   Ht 1.6 m (5' 3)   Wt 86.2 kg (190 lb)   SpO2 97%   BMI 33.66 kg/m       General: Awake, Alert and oriented, well nourished, no acute distress appropriate to stated age  Eye: Pupils equal and reactive to light and accommodation  HENT: Normocephalic, no sinus tenderness.  Neck: No carotid bruits, No JVD  Lungs: Clear to auscultation, non-labored respiration.  Heart: Normal rate, regular rhythm, no murmur, gallop.  Bilateral lower extremity edema.  Abdomen: Normal x 4 quadrants, no guarding, rebound or tenderness.  Musculoskeletal: Normal range of motion, strength and grips are equal.  Skin: No rashes, hives, eczema or psoriasis.  Neurologic: Awake, alert, and oriented X3.  Psychiatric: Cooperative, appropriate mood and affect.    BMP (Last 24 Hours):    Recent Results last 24 hours     11/17/23  0657   SODIUM 136   POTASSIUM 4.0   CHLORIDE 101   CO2 23   BUN 23   CREATININE 0.58   CALCIUM 8.9   GLUCOSENF 367*        CBC (Last 24 Hours):  No results for input(s): WBC, HGB, HCT, MCV, PLTCNT in the last 24 hours.     No results found for this visit on 11/15/23 (from the past 720 hours).   TROPONIN-T   Date Value Ref Range Status   11/16/2023 10 <=14 ng/L Final       Procedure:  Transthoracic complete echo, 2D, spectral and tissue Doppler, color flow Doppler, M-mode.     Quality:  The study images were of technically adequate quality.     Indications: Dyspnea     Conclusions:  The study images were of technically adequate quality.  The left ventricular ejection fraction by visual assessment  is estimated to be 60-65%.  Moderate left ventricular hypertrophy.  Right ventricular systolic pressure is .     Findings  Left Ventricle:   Normal left ventricular size. Moderate left ventricular hypertrophy. The left ventricular ejection fraction by visual assessment is estimated to be 60-65%.  Right Ventricle:  Normal right ventricular size. Normal right ventricular systolic function. RV systolic pressure is at the upper limits of normal. Right ventricular systolic pressure is .  Left Atrium:   The left atrium is normal in size.  Right Atrium:   The right atrium is of normal size.  Mitral Valve:   The mitral valve is normal. No evidence of mitral stenosis. There is mild mitral regurgitation.  Tricuspid Valve:   Trace tricuspid regurgitation present. There is no evidence of tricuspid stenosis.  Aortic Valve:   The aortic valve is not well visualized. No Aortic valve stenosis. There is no evidence of aortic regurgitation.  Pulmonic Valve:   The pulmonic valve is not well visualized. There is no evidence of pulmonic valve stenosis.  IVC/Hepatic Veins:   Normal IVC size with >50% inspiratory collapse (estimated RA pressure 3 mmHg).  Aorta:   The aortic root is of normal size. The ascending aorta is normal in size.  Pericardium/Pleural space:   No significant pericardial effusion demonstrated.     Electronically signed by: MD Emery Mangle on 11/16/2023 12:35 PM       Lab Results   Component Value Date    INR 1.03 10/25/2023          Orders Placed This Encounter    ADULT ROUTINE BLOOD CULTURE, SET OF 2 BOTTLES (BACTERIA AND YEAST)    URINE CULTURE    RESPIRATORY CULTURE AND GRAM STAIN, AEROBIC    SPUTUM SCREEN    RESPIRATORY CULTURE AND GRAM STAIN (PERFORMABLE)    XR CHEST PA AND LATERAL    CT ANGIO CHEST FOR PULMONARY EMBOLUS W IV CONTRAST    CBC/DIFF    BLOOD GAS W/ LACTATE REFLEX Venous    COMPREHENSIVE METABOLIC PANEL, NON-FASTING    TROPONIN-T NOW    TROPONIN-T IN TWO HOURS    URINALYSIS,  MACROSCOPIC AND MICROSCOPIC W/CULTURE REFLEX    BETA-HYDROXYBUTYRATE    NT-PROBNP    CBC WITH DIFF    URINALYSIS, MACROSCOPIC    URINALYSIS, MICROSCOPIC    LACTIC ACID - FIRST REFLEX    D-DIMER    PROCALCITONIN    TROPONIN-T    CANCELED: URINALYSIS WITH REFLEX MICROSCOPIC AND CULTURE IF POSITIVE    CANCELED: URINALYSIS, MACRO/MICRO    CBC/DIFF - AM ONCE    BASIC METABOLIC PANEL    PHOSPHORUS    MAGNESIUM     CANCELED: CBC/DIFF    BASIC METABOLIC PANEL, NON-FASTING    MAGNESIUM     CANCELED: TROPONIN-T    C-REACTIVE PROTEIN(CRP),INFLAMMATION    HEPATIC FUNCTION PANEL    CANCELED: NT-PROBNP    LIPASE    TROPONIN-T    URINALYSIS WITH REFLEX MICROSCOPIC AND CULTURE IF POSITIVE    NT-PROBNP    URINALYSIS, MACRO/MICRO    LACTIC ACID LEVEL W/ REFLEX FOR LEVEL >2.0    CBC WITH DIFF    CANCELED: CBC WITH DIFF    CYCLIC CITRULLINATED PEPTIDE ANTIBODIES, IGG, SERUM    ANCA VASCULITIS PANEL, SERUM    SEDIMENTATION RATE    ANCA SCREEN WITH REFLEX TO ANCA TITER    RHEUMATOID FACTOR, SERUM    ANGIOTENSIN CONVERTING ENZYME (ACE), SERUM    RNP ANTIBODIES, IGG, SERUM    ANTI-DOUBLE STRANDED DNA AB    SS-A/RO ANTIBODIES, IGG, SERUM    SS-B/LA ANTIBODIES, IGG, SERUM    ANA SCREEN WITH REFLEX    MYELOPEROXIDASE ANTIBODIES, IGG, SERUM    PROTEINASE 3 ANTIBODIES, IGG, SERUM    HGA1C (HEMOGLOBIN A1C WITH EST AVG GLUCOSE)  BASIC METABOLIC PANEL    OXYGEN - NASAL CANNULA    ECG 12 LEAD    PERFORM POC WHOLE BLOOD GLUCOSE    TRANSTHORACIC ECHOCARDIOGRAM - ADULT    PULMONARY FUNCTION TESTING - ADULT - PERFORM IN [facility] LAB    INSERT & MAINTAIN PERIPHERAL IV ACCESS    PERIPHERAL IV DRESSING CHANGE    PATIENT CLASS/LEVEL OF CARE DESIGNATION - TM[facility]/STF    furosemide (LASIX) 10 mg/mL injection    aspirin  (ECOTRIN) enteric coated tablet 81 mg    fluticasone  (FLONASE ) 50 mcg per spray nasal spray    cyclobenzaprine  (FLEXERIL ) tablet    cetirizine (zyrTEC) tablet    pantoprazole  (PROTONIX ) delayed release tablet    AND Linked Order Group     budesonide   (PULMICORT  RESPULES) 0.5 mg/2 mL nebulizer suspension     arformoterol (BROVANA) 15 mcg/2 mL nebulizer solution    NS flush syringe    NS flush syringe    AND Linked Order Group     NS 250 mL flush bag     D5W 250 mL flush bag    dextrose  (GLUTOSE) 40% oral gel    dextrose  50% (0.5 g/mL) injection - syringe    glucagon  (GLUCAGEN) injection 1 mg    ondansetron  (ZOFRAN ) 2 mg/mL injection    folic acid  (FOLVITE ) tablet    acetaminophen  (TYLENOL ) tablet    ipratropium-albuterol  0.5 mg-3 mg(2.5 mg base)/3 mL Solution for Nebulization    enoxaparin  PF (LOVENOX ) 40 mg/0.4 mL SubQ injection    iopamidol (ISOVUE-370) 76% infusion    lisinopril  (PRINIVIL ) tablet    doxycycline  hyclate (VIBRAMYCIN ) capsule    albuterol  (PROVENTIL ) 2.5 mg / 3 mL (0.083%) neb solution    insulin  glargine-yfgn 100 units/mL injection    Correction/SSIP insulin  lispro 100 units/mL injection    fenofibrate (TRICOR) 145 mg tablet    carvedilol (COREG) tablet    hydroCHLOROthiazide (HYDRODIURIL) tablet       Medications Discontinued During This Encounter   Medication Reason    Amlodipine -Benazepril  10-40 mg Capsule 1 Capsule     insulin  glargine-yfgn 100 units/mL injection     Correction/SSIP insulin  lispro 100 units/mL injection     methylPREDNISolone  sod succ (SOLU-medrol ) 40 mg/mL injection     amLODIPine  (NORVASC ) tablet Therapy completed    metoprolol  succinate (TOPROL -XL) 24 hr extended release tablet Therapy completed    methylPREDNISolone  sod succ (SOLU-medrol ) 40 mg/mL injection Therapy completed       Assessment and Plan:  Assessment/Plan   1. Dyspnea    2. Hyperglycemia    3. Lactic acidosis    4. Dependent edema    5. Dyspnea on exertion    6. Hypertension, unspecified type        Patient seen and examined.  Cardiology was consulted secondary to elevated RSVP, palpitations and hypertension.  She is resting comfortably in bed on rounds easily awoken.  She has no anginal equivalent complaints does have bilateral lower extremity edema that  she reports has improved since admission.  Cardiology will discontinue calcium channel blocker continue lisinopril .  Also we will stop metoprolol  succinate start carvedilol 3.125 mg b.i.d. and 12.5 mg hydrochlorothiazide for antihypertensive medication regimen.  Echocardiogram demonstrates normal LVEF RSV P 32 mmHg with no severe valvular disease noted.  No further workup has necessary from cardiology at this time.  Partners available over the weekend if any needs arise.        Scot Ricks, APRN, CNP  [facility]  Cardiology  [facility]    Patient is seen and examined on rounds.  Greater than 50% of the billable time on this shared visit was provided by me including independently interviewing and examining the patient.  Cardiology was consulted secondary to elevated RVSP.  Echocardiogram showed estimated RVSP to be at upper limits of normal range with normal left ventricular systolic function.  Patient did have bilateral lower extremity edema and presentation but he had been taking amlodipine  at home.  Would recommend avoiding amlodipine  use.  We will continue ACE inhibitor therapy with benazepril  and add hydrochlorothiazide 12.5 mg daily.  We will also change from metoprolol  therapy to carvedilol therapy for better blood pressure control.  Patient can follow up in the office after discharge.  We will see again if needed.  Please see below for my exam findings.      General:  Sleeping when I entered the room but awakened to voice  Eye: Pupils equal and reactive to light and accommodation  HENT: Normocephalic, no sinus tenderness.  Neck: No carotid bruits, No JVD  Lungs: Clear to auscultation, non-labored respiration.  Heart: Normal rate, regular rhythm, no murmur, gallop or edema.  Abdomen: Normal x 4 quadrants, no guarding, rebound or tenderness.  Musculoskeletal: Normal range of motion, strength and grips are equal.  Skin: No rashes, hives, eczema or psoriasis.  Neurologic: Awake, alert, and  oriented X3.  Psychiatric: Cooperative, appropriate mood and affect.   Telemetry: Sinus rhythm   Jayson Rives, MD     A portion of this documentation may have been generated using [facility] voice recognition software and may contain syntax/voice recognition errors.              [1]   Allergies  Allergen Reactions    Ceftriaxone   Other Adverse Reaction (Add comment)     Unsure if it was a steriod or ceftriaxone . It was given at same time. Tongue swelling reported previously     Dilaudid [Hydromorphone]    [2]   Current Facility-Administered Medications:     acetaminophen  (TYLENOL ) tablet, 650 mg, Oral, Q4H PRN, Baig, Mirza Saud, MD, 650 mg at 11/17/23 1105    budesonide  (PULMICORT  RESPULES) 0.5 mg/2 mL nebulizer suspension, 0.5 mg, Nebulization, 2x/day, 0.5 mg at 11/17/23 0758 **AND** arformoterol (BROVANA) 15 mcg/2 mL nebulizer solution, 15 mcg, Nebulization, 2x/day, Baig, Mirza Saud, MD, 15 mcg at 11/17/23 0758    aspirin  (ECOTRIN) enteric coated tablet 81 mg, 81 mg, Oral, Daily, Baig, Mirza Saud, MD, 81 mg at 11/17/23 1023    carvedilol (COREG) tablet, 3.125 mg, Oral, 2x/day-Food, Cyrena Cape, APRN, CNP    cetirizine (zyrTEC) tablet, 10 mg, Oral, Daily PRN, Baig, Mirza Saud, MD    Correction/SSIP insulin  lispro 100 units/mL injection, 4-18 Units, Subcutaneous, 4x/day AC, Krishnathas, Ananthan, MD, 14 Units at 11/17/23 1230    cyclobenzaprine  (FLEXERIL ) tablet, 10 mg, Oral, 3x/day PRN, Baig, Mirza Saud, MD    NS 250 mL flush bag, , Intravenous, Q15 Min PRN **AND** D5W 250 mL flush bag, , Intravenous, Q15 Min PRN **AND** Small Volume Medication Infusion Line Flush, , , UNTIL DISCONTINUED, Baig, Jackee Ghent, MD    dextrose  (GLUTOSE) 40% oral gel, 15 g, Oral, Q15 Min PRN, Baig, Mirza Saud, MD    dextrose  50% (0.5 g/mL) injection - syringe, 12.5 g, Intravenous, Q15 Min PRN, Baig, Mirza Saud, MD    doxycycline  hyclate (VIBRAMYCIN ) capsule, 100 mg, Oral, 2x/day, Francis Dominion, MD, 100 mg at 11/17/23 1023    enoxaparin  PF  (LOVENOX )  40 mg/0.4 mL SubQ injection, 40 mg, Subcutaneous, Q24H, Baig, Mirza Saud, MD, 40 mg at 11/16/23 0014    fenofibrate (TRICOR) 145 mg tablet, 145 mg, Oral, Daily with Breakfast, Krishnathas, Ananthan, MD, 145 mg at 11/17/23 1418    fluticasone  (FLONASE ) 50 mcg per spray nasal spray, 2 Spray, Each Nostril, Daily, Baig, Mirza Saud, MD, 2 Spray at 11/17/23 1023    folic acid  (FOLVITE ) tablet, 1 mg, Oral, Daily, Baig, Mirza Saud, MD, 1 mg at 11/17/23 1105    glucagon  (GLUCAGEN) injection 1 mg, 1 mg, IntraMUSCULAR, Once PRN, Baig, Mirza Saud, MD    hydroCHLOROthiazide (HYDRODIURIL) tablet, 12.5 mg, Oral, Daily, Cyrena Cape, APRN, CNP    insulin  glargine-yfgn 100 units/mL injection, 20 Units, Subcutaneous, 2x/day, Krishnathas, Ananthan, MD, 20 Units at 11/17/23 1108    ipratropium-albuterol  0.5 mg-3 mg(2.5 mg base)/3 mL Solution for Nebulization, 3 mL, Nebulization, Q4H PRN, Baig, Mirza Saud, MD    [DISCONTINUED] amLODIPine  (NORVASC ) tablet, 10 mg, Oral, Daily, 10 mg at 11/17/23 1023 **AND** lisinopril  (PRINIVIL ) tablet, 40 mg, Oral, Daily, Baig, Mirza Saud, MD, 40 mg at 11/17/23 1023    NS flush syringe, 3 mL, Intracatheter, Q8HRS, Baig, Mirza Saud, MD, 20 mL at 11/17/23 0603    NS flush syringe, 3 mL, Intracatheter, Q1H PRN, Baig, Mirza Saud, MD, 3 mL at 11/16/23 0001    ondansetron  (ZOFRAN ) 2 mg/mL injection, 4 mg, Intravenous, Q6H PRN, Baig, Mirza Saud, MD, 4 mg at 11/17/23 1104    pantoprazole  (PROTONIX ) delayed release tablet, 40 mg, Oral, Daily, Baig, Mirza Saud, MD, 40 mg at 11/17/23 0602

## 2023-11-17 NOTE — Assessment & Plan Note (Signed)
 Outpatient sleep studies.  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

## 2023-11-17 NOTE — Assessment & Plan Note (Signed)
 Had mild leukocytosis without obvious lung exam suggesting wheezing when patient is seen  ESR 41.  D-dimer 0.2.  Procalcitonin 0.06.  CRP 1.3.  ANA screening pending.  Continue DuoNeb PRN  Long-acting beta agonist and inhaled steroids.  Doxycycline  b.i.d.  Decrease steroids

## 2023-11-17 NOTE — Progress Notes (Signed)
 [facility]  Progress Note    [name] [name]  Date of service: 11/17/2023   Date of Admission:  11/15/2023    Hospital Day:  LOS: 2 days     Chief complaint: SOB    Subjective:  [name] [name] is a 68 y.o., White female with past medical history of asthma hypertension who presents with leg swelling and shortness of breath.  She has shortness of breath with exertion, intermittent, improved with rest, associated for leg swelling, not associated with a wheeze or chest pain.  She has a history of asthma on PRN inhaler at home.  She is nonsmoker.  She denies respiratory exposures.  She has a history of several CTs over the past year which have shown right upper lobe nodularity up to 6 mm which now shows about 8 mm.  She has had some ground-glass opacity on the left side which has been stable.     10/10: room air. Still with dyspnea with minimal exertion. C/o swollen and palpitations.     Vital Signs:  Temp  Avg: 36.7 C (98.1 F)  Min: 36.7 C (98 F)  Max: 36.7 C (98.1 F)    Pulse  Avg: 68.6  Min: 61  Max: 85 BP  Min: 131/52  Max: 165/45   Resp  Avg: 18.4  Min: 1  Max: 29 SpO2  Avg: 96.3 %  Min: 93 %  Max: 99 %          Input/Output    Intake/Output Summary (Last 24 hours) at 11/17/2023 1159  Last data filed at 11/17/2023 0900  Gross per 24 hour   Intake 120 ml   Output --   Net 120 ml    I/O last shift:  10/10 0700 - 10/10 1859  In: 120 [P.O.:120]  Out: -    acetaminophen  (TYLENOL ) tablet, 650 mg, Oral, Q4H PRN  amLODIPine  (NORVASC ) tablet, 10 mg, Oral, Daily   And  lisinopril  (PRINIVIL ) tablet, 40 mg, Oral, Daily  budesonide  (PULMICORT  RESPULES) 0.5 mg/2 mL nebulizer suspension, 0.5 mg, Nebulization, 2x/day   And  arformoterol (BROVANA) 15 mcg/2 mL nebulizer solution, 15 mcg, Nebulization, 2x/day  aspirin  (ECOTRIN) enteric coated tablet 81 mg, 81 mg, Oral, Daily  cetirizine (zyrTEC) tablet, 10 mg, Oral, Daily PRN  Correction/SSIP insulin  lispro 100 units/mL injection, 4-18 Units, Subcutaneous,  4x/day AC  cyclobenzaprine  (FLEXERIL ) tablet, 10 mg, Oral, 3x/day PRN  NS 250 mL flush bag, , Intravenous, Q15 Min PRN   And  D5W 250 mL flush bag, , Intravenous, Q15 Min PRN  dextrose  (GLUTOSE) 40% oral gel, 15 g, Oral, Q15 Min PRN  dextrose  50% (0.5 g/mL) injection - syringe, 12.5 g, Intravenous, Q15 Min PRN  doxycycline  hyclate (VIBRAMYCIN ) capsule, 100 mg, Oral, 2x/day  enoxaparin  PF (LOVENOX ) 40 mg/0.4 mL SubQ injection, 40 mg, Subcutaneous, Q24H  fenofibrate (TRICOR) 145 mg tablet, 145 mg, Oral, Daily with Breakfast  fluticasone  (FLONASE ) 50 mcg per spray nasal spray, 2 Spray, Each Nostril, Daily  folic acid  (FOLVITE ) tablet, 1 mg, Oral, Daily  glucagon  (GLUCAGEN) injection 1 mg, 1 mg, IntraMUSCULAR, Once PRN  insulin  glargine-yfgn 100 units/mL injection, 20 Units, Subcutaneous, 2x/day  ipratropium-albuterol  0.5 mg-3 mg(2.5 mg base)/3 mL Solution for Nebulization, 3 mL, Nebulization, Q4H PRN  [START ON 11/18/2023] methylPREDNISolone  sod succ (SOLU-medrol ) 40 mg/mL injection, 40 mg, Intravenous, Daily  metoprolol  succinate (TOPROL -XL) 24 hr extended release tablet, 12.5 mg, Oral, Daily  NS flush syringe, 3 mL, Intracatheter, Q8HRS  NS flush syringe,  3 mL, Intracatheter, Q1H PRN  ondansetron  (ZOFRAN ) 2 mg/mL injection, 4 mg, Intravenous, Q6H PRN  pantoprazole  (PROTONIX ) delayed release tablet, 40 mg, Oral, Daily        Physical Exam:  Constitutional: awake, alert, appears stated age. No acute distress noted.    Eyes: Pupils equal and round, reactive to light and accomodation. , Sclera non-icteric. , normal findings: lids and lashes normal and conjunctivae and sclerae normal  ENT: Head atraumatic and normocephalic, External Ears: normal pinnae shape and position and no signs of inflammation, Nose without erythema. , Mouth mucous membranes moist. , No oral lesions.   Neck: no thyromegaly or lymphadenopathy, supple, symmetrical, trachea midline, and no adenopathy.   Respiratory:   Clear.   Cardiovascular: regular  rate and rhythm, S1, S2 normal.    Gastrointestinal: non-distended, Soft, non-tender, Bowel sounds normal, No hepatosplenomegaly, No masses.    Musculoskeletal:  moves all 4 extremities , no edema noted.   Integumentary:  Skin warm and dry and Skin color, texture, turgor normal. No rashes or lesions  Neurologic: Grossly normal.  Following commands, CN II - XII grossly intact , No tremor  Lymphatic/Immunologic/Hematologic: No lymphadenopathy  Psychiatric:  Pleasant affect.  Insight and judgment seem intact.      Labs:     Results for orders placed or performed during the hospital encounter of 11/15/23 (from the past 24 hours)   POC BLOOD GLUCOSE (RESULTS)   Result Value Ref Range    GLUCOSE, POC 404 (H) 70 - 100 mg/dl   POC BLOOD GLUCOSE (RESULTS)   Result Value Ref Range    GLUCOSE, POC 331 (H) 70 - 100 mg/dl   RESPIRATORY CULTURE AND GRAM STAIN (PERFORMABLE)    Specimen: Sputum   Result Value Ref Range    GRAM STAIN 1+ Rare Gram Negative Rod     GRAM STAIN 4+ Many Gram Positive Cocci in Pairs    POC BLOOD GLUCOSE (RESULTS)   Result Value Ref Range    GLUCOSE, POC 386 (H) 70 - 100 mg/dl   YHJ8R (HEMOGLOBIN J8R WITH EST AVG GLUCOSE)   Result Value Ref Range    HEMOGLOBIN A1C 9.7 (H) 4.0 - 6.0 %    ESTIMATED AVERAGE GLUCOSE 232 mg/dL   BASIC METABOLIC PANEL   Result Value Ref Range    SODIUM 136 133 - 144 mmol/L    POTASSIUM 4.0 3.2 - 5.0 mmol/L    CHLORIDE 101 96 - 106 mmol/L    CO2 TOTAL 23 22 - 30 mmol/L    ANION GAP 12 7 - 18 mmol/L    CALCIUM 8.9 8.3 - 10.7 mg/dL    GLUCOSE 632 (H) 74 - 109 mg/dL    BUN 23 8 - 23 mg/dL    CREATININE 9.41 9.49 - 0.90 mg/dL    BUN/CREA RATIO 40     ESTIMATED GFR >90 >90 mL/min/1.30m^2      Radiology:      Results for orders placed or performed during the hospital encounter of 11/15/23 (from the past 2160 hours)   XR CHEST PA AND LATERAL     Status: None    Narrative    Bettie S Whistler    XR CHEST PA AND LATERAL 11/15/2023 8:54 PM    CLINICAL HISTORY: dyspnea  Dyspnea    TECHNIQUE:   2 views of the chest were submitted on 2 images.    COMPARISON: November 09, 2023      FINDINGS:       Heart  size normal.  No infiltrate, effusion or edema.  No pneumothorax.  No evidence of interstitial disease.  No focal or acute osseous abnormality.        Impression    No radiographic evidence of acute cardiopulmonary process.            Radiologist location ID: [id number]     CT ANGIO CHEST FOR PULMONARY EMBOLUS W IV CONTRAST     Status: None    Narrative    Elbia S Maddy    CT ANGIO CHEST FOR PULMONARY EMBOLUS W IV CONTRAST performed on 11/16/2023 2:30 AM.    INDICATION:  Shortness a breath   Additional History:  dyspnea    TECHNIQUE:  CT angiography of the chest performed for evaluation of the pulmonary arteries, with axial, sagittal, and coronal multiplanar reformations and a coronal 3D MIP.  Dose modulation, automated exposure control, and/or iterative reconstruction were used for dose reduction.    CONTRAST: 100 mL of Isovue 370 IV    COMPARISON:  CT 11/09/2023  ___________________________________  FINDINGS:    HEART / GREAT VESSELS: There are no filling defects in the pulmonary arteries to suggest a pulmonary embolism.  The heart is normal in size without pericardial effusion.  Atherosclerotic calcification of the thoracic aorta without aneurysmal dilation. Mild atherosclerotic calcification of the aortic annulus.SABRA     LUNGS / AIRWAYS: Solid nodule in the right upper lobe adjacent to the fissure measuring approximately 8 x 6 mm similar to 11/09/2023 CT. Adjacent 3 mm. perifissural nodule (series 6, image 23) which may represent lymph node. Elongated left upper lobe groundglass opacity similar to 11/09/2023 CT. Negative for pleural effusion or pneumothorax.  The central airways are patent.    MEDIASTINUM: There is no hilar or mediastinal lymphadenopathy.    UPPER ABDOMEN: Negative for acute abnormality.     BONES:  Degenerative changes of the thoracic spine.. Negative for acute abnormality    OTHER:  The  visualized thyroid gland is unremarkable.  ___________________________________    Impression    1. Negative for acute pulmonary embolus.  2. Solid pulmonary nodule in the right upper lobe measuring 8 mm. Findings may be further evaluated with PET CT or follow-up CT chest.  3. Similar appearance of elongated mixed solid and groundglass opacity in the left upper lobe which may also be further evaluated with PET CT.      Radiologist location ID: [id number]        Consults:   Assessment/ Plan:   Active Hospital Problems    Diagnosis    Primary Problem: Dyspnea    Acute hypoxic respiratory failure (CMS HCC)    Chronic bronchitis    Snoring    Diabetes mellitus type 2, insulin  dependent (CMS HCC)    Hypertensive heart disease without heart failure    Gastroesophageal reflux disease without esophagitis    Primary hypertension    Dependent edema    Uncomplicated asthma, unspecified asthma severity, unspecified whether persistent    Lung nodules     Patient seen and examined  Chart reviewed  Tele shows sinus  CT chest shows 8mm RUL nodule  68 y/o presents with SOB and swelling  PFT shows small airway disease but no overt restriction/obstruction  ILD lab w/u showing RF of 181, other labs are negative.  Will ask rheumatology to weigh in  Echo notes that her RVSP was elevated and moderate LVH  Will consult to cardiology to w/u for non pulmonary causes of SOB. Also c/o palpitations  and swelling.   May need RHC  Will need PSG in the future as well  Will also need PET in few weeks for RUL nodule f/u  Will stop IV steroids - pt is not wheezing and blood sugars have been difficult to control  Will discuss with my attending        Larraine LITTIE Marc, APRN, CNP,

## 2023-11-17 NOTE — Assessment & Plan Note (Signed)
 Continue PPI.

## 2023-11-17 NOTE — Assessment & Plan Note (Addendum)
 With echo evidence of LVH  Continue Coreg , hydrochlorothiazide and lisinopril 

## 2023-11-17 NOTE — Assessment & Plan Note (Signed)
 With echo evidence of LVH  Continue Toprol , Norvasc  and lisinopril 

## 2023-11-17 NOTE — Assessment & Plan Note (Addendum)
 Had mild leukocytosis without obvious lung exam suggesting wheezing when patient is seen  ESR 41.  D-dimer 0.2.  Procalcitonin 0.06.  CRP 1.3.  ANA screening pending.  Continue DuoNeb PRN  Long-acting beta agonist and inhaled steroids.  Doxycycline  b.i.d.  Discontinued steroids

## 2023-11-17 NOTE — Assessment & Plan Note (Signed)
 Exertional shortness of breath  Troponin T within normal limit including BNP.    Echo revealed LVEF 60-65%.  Moderate LVH.  no significant valvular heart disease.    CTA angio chest no PE  Dependent edema could be due to hypertensive heart disease

## 2023-11-17 NOTE — Assessment & Plan Note (Signed)
 Now needing O2 via nasal cannula  PFT studies report pending

## 2023-11-17 NOTE — Care Plan (Signed)
 Alert and oriented x 4. Is on 2.5 liters oxygen and sats well. Sinus rhythm on tele to brady at times, 50's. Is up with standby assist. Continues solumedrol iv. Blood sugar runs high. Denies pain this shift. Took shower upon arrival to room. Daughter stayed in room last night and helps with needs.   Berwyn Rhyme, RN  11/17/2023 06:20    Problem: Adult Inpatient Plan of Care  Goal: Plan of Care Review  11/17/2023 0620 by Berwyn MATSU, RN  Outcome: Ongoing (see interventions/notes)  11/16/2023 2327 by Berwyn MATSU, RN  Outcome: Ongoing (see interventions/notes)  Goal: Patient-Specific Goal (Individualized)  11/17/2023 0620 by Berwyn MATSU, RN  Outcome: Ongoing (see interventions/notes)  11/16/2023 2327 by Berwyn MATSU, RN  Outcome: Ongoing (see interventions/notes)  Flowsheets (Taken 11/16/2023 2300)  Individualized Care Needs: shower  Anxieties, Fears or Concerns: weakness  Goal: Absence of Hospital-Acquired Illness or Injury  11/17/2023 0620 by Berwyn MATSU, RN  Outcome: Ongoing (see interventions/notes)  11/16/2023 2327 by Berwyn MATSU, RN  Outcome: Ongoing (see interventions/notes)  Intervention: Identify and Manage Fall Risk  Recent Flowsheet Documentation  Taken 11/16/2023 2300 by Berwyn MATSU, RN  Safety Promotion/Fall Prevention:   fall prevention program maintained   nonskid shoes/slippers when out of bed   safety round/check completed  Intervention: Prevent Skin Injury  Recent Flowsheet Documentation  Taken 11/16/2023 2300 by Berwyn MATSU, RN  Body Position: sitting  Skin Protection: adhesive use limited  Intervention: Prevent and Manage VTE (Venous Thromboembolism) Risk  Recent Flowsheet Documentation  Taken 11/16/2023 2300 by Berwyn MATSU, RN  VTE Prevention/Management: anticoagulant therapy maintained  Intervention: Prevent Infection  Recent Flowsheet Documentation  Taken 11/16/2023 2300 by Berwyn MATSU, RN  Infection Prevention:   cohorting utilized   promote handwashing   rest/sleep promoted  Goal: Optimal Comfort and  Wellbeing  11/17/2023 0620 by Berwyn MATSU, RN  Outcome: Ongoing (see interventions/notes)  11/16/2023 2327 by Berwyn MATSU, RN  Outcome: Ongoing (see interventions/notes)  Intervention: Provide Person-Centered Care  Recent Flowsheet Documentation  Taken 11/16/2023 2300 by Berwyn MATSU, RN  Trust Relationship/Rapport:   questions answered   questions encouraged   reassurance provided  Goal: Rounds/Family Conference  11/17/2023 0620 by Berwyn MATSU, RN  Outcome: Ongoing (see interventions/notes)  11/16/2023 2327 by Berwyn MATSU, RN  Outcome: Ongoing (see interventions/notes)

## 2023-11-17 NOTE — Progress Notes (Signed)
 [facility] Progress Note    [name] [name], 68 y.o. female            MRN: [id number]   Date of Admission:  11/15/2023  Date of service:  11/17/2023         Room # 372/A              Hospital Day: 2   LOS: 2 days     CC: Dyspnea    Interval History:   68 year old female ex-smoker with asthma/Chronic Obstructive Pulmonary Disease, diabetes and hypertension presented with bilateral lower extremity swelling and uncontrolled blood sugar as well as dyspnea on exertion.  Since admission patient was seen by pulmonologist.  Patient does not need follow up with pulmonology patient uses PRN inhaler.  No history of cardiac failure.  She quit smoking 26 years ago however smoked for 20 years 1 pack per day.    Also complain  lots of snoring never had sleep studies.  CT chest in the past revealed right upper nodularity 6 mm now increased to 8 mm also ground-glass opacity in left side -stable chronic.  Does not use home O2       11/17/2023 : Denies hemoptysis, fever or bleeding manifestation.  No prior history of invasive cardiac workup.  Try to wean oxygen.  seen by Cardiology today for elevated RV systolic pressure.  Metoprolol  changed to Coreg.  Recommendation to avoid Norvasc  due to leg edema.    PFT did not show significant obstruction or restriction.    Probably need outpatient Rheumatology evaluation    Objective:   Filed Vitals:    11/17/23 1552 11/17/23 1700 11/17/23 1800 11/17/23 1945   BP: 119/63 (!) 125/45 (!) 125/45 (!) 134/43   Pulse: 59 59 59 66   Resp: 18  18 17    Temp:    36.2 C (97.2 F)   SpO2: 97%   92%     Oxygen Device  SpO2: 92 %  Flow (L/min) (Oxygen Therapy): 2  Input/Output    Intake/Output Summary (Last 24 hours) at 11/17/2023 2328  Last data filed at 11/17/2023 1800  Gross per 24 hour   Intake 560 ml   Output --   Net 560 ml       Follow up Exam:  Body mass index is 33.66 kg/m. ,  obese built,  afebrile,  not dyspneic,  on room air  Short neck  CVS:  S1 S2  regular,  distant,   No murmurs, 1+ ankle leg edema   Tele:  Sinus  Res. Sys:  Diminished breath sounds,  No Rales,  No Wheezing.   GI:  Abdomen, obese,  no distension,  no tenderness,  no masses  Mus. Skele. Sys:  1+ pitting pedal and ankle leg Edema,  no Calf tenderness  Skin:  no Cyanosis,  no icterus.  No Rash  CNS:  AA x O3,  no focal deficits,  no tremors  Psych:  Normal Affect    Labs:  CBC (Last 24 Hours):    No results for input(s): WBC, HGB, HCT, MCV, PLTCNT in the last 24 hours.      BMP (Last 24 Hours):    Recent Results last 24 hours     11/17/23  0657   SODIUM 136   POTASSIUM 4.0   CHLORIDE 101   CO2 23   BUN 23   CREATININE 0.58   CALCIUM 8.9   GLUCOSENF 367*  Results for orders placed or performed during the hospital encounter of 11/15/23 (from the past 24 hours)   POC BLOOD GLUCOSE (RESULTS)    Collection Time: 11/17/23  5:26 AM   Result Value Ref Range    GLUCOSE, POC 386 (H) 70 - 100 mg/dl   YHJ8R (HEMOGLOBIN J8R WITH EST AVG GLUCOSE)    Collection Time: 11/17/23  6:57 AM   Result Value Ref Range    HEMOGLOBIN A1C 9.7 (H) 4.0 - 6.0 %    ESTIMATED AVERAGE GLUCOSE 232 mg/dL    Narrative    TEST INFORMATION: ESTIMATED AVERAGE GLUCOSE RESULT  Estimated average glucose is a value calculated from percentage HbA1c but reported in the same units as glucose.  The American Diabetes Association and The American Association for Clinical Chemistry recommends use of eAG when reporting HgB A1C values.     BASIC METABOLIC PANEL    Collection Time: 11/17/23  6:57 AM   Result Value Ref Range    SODIUM 136 133 - 144 mmol/L    POTASSIUM 4.0 3.2 - 5.0 mmol/L    CHLORIDE 101 96 - 106 mmol/L    CO2 TOTAL 23 22 - 30 mmol/L    ANION GAP 12 7 - 18 mmol/L    CALCIUM 8.9 8.3 - 10.7 mg/dL    GLUCOSE 632 (H) 74 - 109 mg/dL    BUN 23 8 - 23 mg/dL    CREATININE 9.41 9.49 - 0.90 mg/dL    BUN/CREA RATIO 40     ESTIMATED GFR >90 >90 mL/min/1.49m^2   POC BLOOD GLUCOSE (RESULTS)    Collection Time: 11/17/23 12:13 PM   Result  Value Ref Range    GLUCOSE, POC 336 (H) 70 - 100 mg/dl   POC BLOOD GLUCOSE (RESULTS)    Collection Time: 11/17/23  5:36 PM   Result Value Ref Range    GLUCOSE, POC 274 (H) 70 - 100 mg/dl   POC BLOOD GLUCOSE (RESULTS)    Collection Time: 11/17/23  7:50 PM   Result Value Ref Range    GLUCOSE, POC 275 (H) 70 - 100 mg/dl      Micro:   Hospital Encounter on 11/15/23 (from the past 96 hours)   ADULT ROUTINE BLOOD CULTURE, SET OF 2 BOTTLES (BACTERIA AND YEAST)    Collection Time: 11/16/23 12:20 AM    Specimen: Blood   Culture Result Status    BLOOD CULTURE, ROUTINE No Growth 18-24 hrs. Preliminary   URINE CULTURE    Collection Time: 11/16/23  5:27 AM    Specimen: Urine, Site not specified   Culture Result Status    URINE CULTURE 15,000 CFU/mL Mixed Commensal Flora Final   RESPIRATORY CULTURE AND GRAM STAIN, AEROBIC    Collection Time: 11/16/23 10:34 PM    Specimen: Sputum; Other    Narrative    The following orders were created for panel order RESPIRATORY CULTURE AND GRAM STAIN, AEROBIC.  Procedure                               Abnormality         Status                     ---------                               -----------         ------  SPUTUM SCREEN[[id number]]                                    Final result                 Please view results for these tests on the individual orders.   RESPIRATORY CULTURE AND GRAM STAIN (PERFORMABLE)    Collection Time: 11/16/23 10:34 PM    Specimen: Sputum   Culture Result Status    GRAM STAIN 1+ Rare Gram Negative Rod Preliminary    GRAM STAIN 4+ Many Gram Positive Cocci in Pairs Preliminary     Susceptibility data from last 90 days.  Collected Specimen Info Organism   11/16/23 Urine, Site not specified Mixed Commensal Flora      Imaging Studies:     CT ANGIO CHEST FOR PULMONARY EMBOLUS W IV CONTRAST  Result Date: 11/16/2023  Impression 1. Negative for acute pulmonary embolus. 2. Solid pulmonary nodule in the right upper lobe measuring 8 mm. Findings may be further  evaluated with PET CT or follow-up CT chest. 3. Similar appearance of elongated mixed solid and groundglass opacity in the left upper lobe which may also be further evaluated with PET CT. Radiologist location ID: [id number]         Assessment & Plan  Dyspnea  Dependent edema  Exertional shortness of breath likely multifactorial.  Troponin T within normal limit including BNP.    Echo revealed LVEF 60-65%.  Moderate LVH.  no significant valvular heart disease.    CTA angio chest no PE  Dependent edema could be due to hypertensive heart disease possibly secondary to Norvasc .  Evaluated by cardiologist for elevated RVSP.    However Echocardiogram showed estimated RVSP to be at upper limits of normal range with normal left ventricular systolic function.   Coreg added instead of metoprolol  for better blood pressure control  Norvasc  discontinued due to ankle edema  Acute hypoxic respiratory failure (CMS HCC)  Now needing O2 via nasal cannula.  Oxygen has been weaned off  PFT studies report no significant obstruction restrictive pattern  Rheumatologist consulted.   Latest Reference Range & Units 11/16/23 10:41   ANTI-DNA AB Negative  Negative   CYCLIC CITRULLINATED PEPTIDE ANTIBODY IGG  <3.0 U/mL <0.5   CYCLIC CITRULLINATED PEPTIDE ANTIBODY IGG QUAL Negative  Negative   ANA SCREEN WITH REFLEX  Rpt   ANA SCREEN Negative  Negative   RNP ANTIBODIES, IGG, QUALITATIVE Negative  Negative   RNP ANTIBODIES, IGG, QUANTITATIVE <1.0 AAU/mL <0.2   SS-A/RO ANTIBODIES, IGG, QUALITATIVE Negative  Negative   SS-B/LA ANTIBODIES, IGG, QUALITATIVE Negative  Negative      Latest Reference Range & Units 11/16/23 10:41   RHEUMATOID FACTOR 0 - 14 IU/mL 181 (H)   RHEUMATOID FACTOR, SERUM  Rpt !     Uncomplicated asthma, unspecified asthma severity, unspecified whether persistent  Chronic bronchitis  Had mild leukocytosis without obvious lung exam suggesting wheezing when patient is seen  ESR 41.  D-dimer 0.2.  Procalcitonin 0.06.  CRP 1.3.  ANA  screening pending.  Continue DuoNeb PRN  Long-acting beta agonist and inhaled steroids.  Doxycycline  b.i.d.  Discontinued steroids  Lung nodules  Right upper lobe 8 mm pulmonary nodule Pulmonologist evaluated.  check interstitial lung disease panel as well  She will need PET scan in a few weeks to reassess lung nodularity and follow up .  Snoring  Outpatient sleep  studies.  Probably has untreated sleep apnea  Diabetes mellitus type 2, insulin  dependent (CMS HCC)  A1c 9.7.  Uncontrolled with hyperglycemia.  Increase doses of Lantus  and sliding scale ADA diet  Hypertensive heart disease without heart failure  Primary hypertension  With echo evidence of LVH  Continue Coreg , hydrochlorothiazide and lisinopril   Gastroesophageal reflux disease without esophagitis  Continue PPI     Antibiotics (From admission, onward)      Start     Stop Route Frequency    11/16/23 1100  doxycycline  hyclate (VIBRAMYCIN ) capsule         -- Oral 2 TIMES DAILY       Based on the clinical judgment and patient's condition, inpatient admission is deemed necessary to provide appropriate care and prevent further complications.  DVT/PE Prophylaxis: Enoxaparin   Current active medications:  acetaminophen  (TYLENOL ) tablet, 650 mg, Oral, Q4H PRN  budesonide  (PULMICORT  RESPULES) 0.5 mg/2 mL nebulizer suspension, 0.5 mg, Nebulization, 2x/day   And  arformoterol (BROVANA) 15 mcg/2 mL nebulizer solution, 15 mcg, Nebulization, 2x/day  aspirin  (ECOTRIN) enteric coated tablet 81 mg, 81 mg, Oral, Daily  carvedilol (COREG) tablet, 3.125 mg, Oral, 2x/day-Food  cetirizine (zyrTEC) tablet, 10 mg, Oral, Daily PRN  Correction/SSIP insulin  lispro 100 units/mL injection, 4-18 Units, Subcutaneous, 4x/day AC  cyclobenzaprine  (FLEXERIL ) tablet, 10 mg, Oral, 3x/day PRN  NS 250 mL flush bag, , Intravenous, Q15 Min PRN   And  D5W 250 mL flush bag, , Intravenous, Q15 Min PRN  [START ON 11/18/2023] dextromethorphan-guaiFENesin  (MUCINEX  DM) 30-600mg  per extended release  tablet, 1 Tablet, Oral, Q12H  dextrose  (GLUTOSE) 40% oral gel, 15 g, Oral, Q15 Min PRN  dextrose  50% (0.5 g/mL) injection - syringe, 12.5 g, Intravenous, Q15 Min PRN  doxycycline  hyclate (VIBRAMYCIN ) capsule, 100 mg, Oral, 2x/day  enoxaparin  PF (LOVENOX ) 40 mg/0.4 mL SubQ injection, 40 mg, Subcutaneous, Q24H  fenofibrate (TRICOR) 145 mg tablet, 145 mg, Oral, Daily with Breakfast  fluticasone  (FLONASE ) 50 mcg per spray nasal spray, 2 Spray, Each Nostril, Daily  folic acid  (FOLVITE ) tablet, 1 mg, Oral, Daily  glucagon  (GLUCAGEN) injection 1 mg, 1 mg, IntraMUSCULAR, Once PRN  hydroCHLOROthiazide (HYDRODIURIL) tablet, 12.5 mg, Oral, Daily  insulin  glargine-yfgn 100 units/mL injection, 20 Units, Subcutaneous, 2x/day  ipratropium-albuterol  0.5 mg-3 mg(2.5 mg base)/3 mL Solution for Nebulization, 3 mL, Nebulization, Q4H PRN  lisinopril  (PRINIVIL ) tablet, 40 mg, Oral, Daily  NS flush syringe, 3 mL, Intracatheter, Q8HRS  NS flush syringe, 3 mL, Intracatheter, Q1H PRN  ondansetron  (ZOFRAN ) 2 mg/mL injection, 4 mg, Intravenous, Q6H PRN  pantoprazole  (PROTONIX ) delayed release tablet, 40 mg, Oral, Daily    [x]  I have independently reviewed and interpreted the patient's vitals, laboratory and imaging data, Medications, and physician's notes; and made appropriate changes in the orders and management plan.   [x]  I have discussed plausible action plan with the patient  and the RN.        Kyla Duffy, MD.

## 2023-11-17 NOTE — Assessment & Plan Note (Addendum)
 A1c 9.7.  Uncontrolled with hyperglycemia.  Increase doses of Lantus  and sliding scale ADA diet

## 2023-11-18 DIAGNOSIS — R42 Dizziness and giddiness: Secondary | ICD-10-CM

## 2023-11-18 LAB — POC BLOOD GLUCOSE (RESULTS)
GLUCOSE, POC: 195 mg/dL — ABNORMAL HIGH (ref 70–100)
GLUCOSE, POC: 296 mg/dL — ABNORMAL HIGH (ref 70–100)
GLUCOSE, POC: 293 mg/dL — ABNORMAL HIGH (ref 70–100)
GLUCOSE, POC: 166 mg/dL — ABNORMAL HIGH (ref 70–100)
GLUCOSE, POC: 277 mg/dL — ABNORMAL HIGH (ref 70–100)

## 2023-11-18 MED ORDER — BENZONATATE 100 MG CAPSULE
100.0000 mg | ORAL_CAPSULE | Freq: Three times a day (TID) | ORAL | Status: DC | PRN
Start: 2023-11-18 — End: 2023-11-21

## 2023-11-18 MED ORDER — ALBUTEROL SULFATE CONCENTRATE 2.5 MG/0.5 ML SOLUTION FOR NEBULIZATION
2.5000 mg | INHALATION_SOLUTION | Freq: Four times a day (QID) | RESPIRATORY_TRACT | Status: DC
Start: 2023-11-18 — End: 2023-11-21
  Administered 2023-11-18 (×2): 2.5 mg via RESPIRATORY_TRACT
  Administered 2023-11-18 – 2023-11-19 (×3): 0 mg via RESPIRATORY_TRACT
  Administered 2023-11-19 – 2023-11-20 (×4): 2.5 mg via RESPIRATORY_TRACT
  Administered 2023-11-20: 0 mg via RESPIRATORY_TRACT
  Administered 2023-11-20: 2.5 mg via RESPIRATORY_TRACT
  Administered 2023-11-21: 0 mg via RESPIRATORY_TRACT
  Administered 2023-11-21: 2.5 mg via RESPIRATORY_TRACT

## 2023-11-18 MED ORDER — GUAIFENESIN ER 600 MG TABLET, EXTENDED RELEASE 12 HR
600.0000 mg | EXTENDED_RELEASE_TABLET | Freq: Two times a day (BID) | ORAL | Status: DC
Start: 2023-11-18 — End: 2023-11-21
  Administered 2023-11-18 – 2023-11-21 (×7): 600 mg via ORAL
  Filled 2023-11-18 (×7): qty 1

## 2023-11-18 NOTE — Assessment & Plan Note (Addendum)
 Right upper lobe 8 mm pulmonary nodule Pulmonologist evaluated.  check interstitial lung disease panel as well  She will need PET scan in a few weeks to reassess lung nodularity and follow up .

## 2023-11-18 NOTE — Assessment & Plan Note (Signed)
 Outpatient sleep studies.  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

## 2023-11-18 NOTE — Care Plan (Signed)
 Alert and oriented x 4. Is on 2 liters oxygen and sats well. Sinus rhythm on tele to brady at times, 50's. Is up with standby assist. Blood sugar runs high. Denies pain this shift. Daughter stayed in room last night and helps with needs.   Berwyn Rhyme, RN  11/18/2023 06:20    Problem: Adult Inpatient Plan of Care  Goal: Plan of Care Review  Outcome: Ongoing (see interventions/notes)  Goal: Patient-Specific Goal (Individualized)  Outcome: Ongoing (see interventions/notes)  Flowsheets (Taken 11/17/2023 2103)  Individualized Care Needs: visiting with family  Anxieties, Fears or Concerns: none voiced  Goal: Absence of Hospital-Acquired Illness or Injury  Outcome: Ongoing (see interventions/notes)  Intervention: Identify and Manage Fall Risk  Recent Flowsheet Documentation  Taken 11/17/2023 2103 by Berwyn MATSU, RN  Safety Promotion/Fall Prevention:   fall prevention program maintained   nonskid shoes/slippers when out of bed   safety round/check completed   activity supervised  Intervention: Prevent Skin Injury  Recent Flowsheet Documentation  Taken 11/17/2023 2103 by Berwyn MATSU, RN  Body Position: sitting  Skin Protection: adhesive use limited  Intervention: Prevent and Manage VTE (Venous Thromboembolism) Risk  Recent Flowsheet Documentation  Taken 11/17/2023 2103 by Berwyn MATSU, RN  VTE Prevention/Management: anticoagulant therapy maintained  Intervention: Prevent Infection  Recent Flowsheet Documentation  Taken 11/17/2023 2103 by Berwyn MATSU, RN  Infection Prevention:   barrier precautions utilized   promote handwashing   rest/sleep promoted  Goal: Optimal Comfort and Wellbeing  Outcome: Ongoing (see interventions/notes)  Intervention: Provide Person-Centered Care  Recent Flowsheet Documentation  Taken 11/17/2023 2103 by Berwyn MATSU, RN  Trust Relationship/Rapport:   questions answered   questions encouraged   reassurance provided  Goal: Rounds/Family Conference  Outcome: Ongoing (see interventions/notes)

## 2023-11-18 NOTE — Care Plan (Signed)
 Problem: Fall Injury Risk  Goal: Absence of Fall and Fall-Related Injury  11/18/2023 1152 by Ileana RAMAN, RN  Outcome: Ongoing (see interventions/notes)  11/18/2023 1152 by Ileana RAMAN, RN  Outcome: Ongoing (see interventions/notes)     Problem: Fall Injury Risk  Goal: Absence of Fall and Fall-Related Injury  11/18/2023 1152 by Ileana RAMAN, RN  Outcome: Ongoing (see interventions/notes)  11/18/2023 1152 by Ileana RAMAN, RN  Outcome: Ongoing (see interventions/notes)     Problem: VTE (Venous Thromboembolism)  Goal: Tissue Perfusion  11/18/2023 1152 by Ileana RAMAN, RN  Outcome: Ongoing (see interventions/notes)  11/18/2023 1152 by Ileana RAMAN, RN  Outcome: Ongoing (see interventions/notes)  Goal: Optimal Right Ventricular Function  11/18/2023 1152 by Ileana RAMAN, RN  Outcome: Ongoing (see interventions/notes)  11/18/2023 1152 by Ileana RAMAN, RN  Outcome: Ongoing (see interventions/notes)

## 2023-11-18 NOTE — Assessment & Plan Note (Signed)
 Continue PPI.

## 2023-11-18 NOTE — Assessment & Plan Note (Signed)
 Had mild leukocytosis without obvious lung exam suggesting wheezing when patient is seen  ESR 41.  D-dimer 0.2.  Procalcitonin 0.06.  CRP 1.3.  ANA screening pending.  Continue DuoNeb PRN  Long-acting beta agonist and inhaled steroids.  Doxycycline  b.i.d.  Discontinued steroids

## 2023-11-18 NOTE — Assessment & Plan Note (Addendum)
 Exertional shortness of breath likely multifactorial.  Troponin T within normal limit including BNP.    Echo revealed LVEF 60-65%.  Moderate LVH.  no significant valvular heart disease.    CTA angio chest no PE  Dependent edema could be due to hypertensive heart disease possibly secondary to Norvasc .  Evaluated by cardiologist for elevated RVSP.    However Echocardiogram showed estimated RVSP to be at upper limits of normal range with normal left ventricular systolic function.   Cardiology Coreg added instead of metoprolol  for better blood pressure control  Norvasc  discontinued due to ankle edema  Due to bradycardia Coreg has to be held

## 2023-11-18 NOTE — Care Plan (Signed)
 Problem: Adult Inpatient Plan of Care  Goal: Plan of Care Review  Outcome: Ongoing (see interventions/notes)  Goal: Patient-Specific Goal (Individualized)  Outcome: Ongoing (see interventions/notes)  Goal: Absence of Hospital-Acquired Illness or Injury  Outcome: Ongoing (see interventions/notes)  Goal: Optimal Comfort and Wellbeing  Outcome: Ongoing (see interventions/notes)  Goal: Rounds/Family Conference  Outcome: Ongoing (see interventions/notes)     Problem: Adult Inpatient Plan of Care  Goal: Plan of Care Review  Outcome: Ongoing (see interventions/notes)  Goal: Patient-Specific Goal (Individualized)  Outcome: Ongoing (see interventions/notes)  Goal: Absence of Hospital-Acquired Illness or Injury  Outcome: Ongoing (see interventions/notes)  Goal: Optimal Comfort and Wellbeing  Outcome: Ongoing (see interventions/notes)  Goal: Rounds/Family Conference  Outcome: Ongoing (see interventions/notes)     Problem: Fall Injury Risk  Goal: Absence of Fall and Fall-Related Injury  Outcome: Ongoing (see interventions/notes)     Problem: Gas Exchange Impaired  Goal: Optimal Gas Exchange  Outcome: Ongoing (see interventions/notes)

## 2023-11-18 NOTE — Assessment & Plan Note (Signed)
 A1c 9.7.  Uncontrolled with hyperglycemia.  Increase doses of Lantus  and sliding scale ADA diet

## 2023-11-18 NOTE — Nurses Notes (Signed)
 Walked with patient in hallway around unit with patients daughter at side. Patient and daughter wanting to see if heart rate dropped. Walked around unit floor and back to room and heart rate went up to 74 and is usually in 50's at rest. Oxygen sat was 97% on room air but patient was out of breath and breathing heavy upon arrival to room. Denies any chest pain or pain in general.

## 2023-11-18 NOTE — Nurses Notes (Signed)
 Pt c/o chest pain, EKG ordered. Result SB 50. Pt is Current SR with HR 61 ON tele. Dr. Carie notified.   Ileana Sides, RN

## 2023-11-18 NOTE — Assessment & Plan Note (Addendum)
 With echo evidence of LVH  Continue Coreg , hydrochlorothiazide and lisinopril   Patient was seen by cardiologist Dr. Jodean. no further inpatient workup required

## 2023-11-18 NOTE — Assessment & Plan Note (Addendum)
 Now needing O2 via nasal cannula.  Oxygen has been weaned off  PFT studies report no significant obstruction restrictive pattern  Rheumatologist consulted.   Latest Reference Range & Units 11/16/23 10:41   ANTI-DNA AB Negative  Negative   CYCLIC CITRULLINATED PEPTIDE ANTIBODY IGG  <3.0 U/mL <0.5   CYCLIC CITRULLINATED PEPTIDE ANTIBODY IGG QUAL Negative  Negative   ANA SCREEN WITH REFLEX  Rpt   ANA SCREEN Negative  Negative   RNP ANTIBODIES, IGG, QUALITATIVE Negative  Negative   RNP ANTIBODIES, IGG, QUANTITATIVE <1.0 AAU/mL <0.2   SS-A/RO ANTIBODIES, IGG, QUALITATIVE Negative  Negative   SS-B/LA ANTIBODIES, IGG, QUALITATIVE Negative  Negative      Latest Reference Range & Units 11/16/23 10:41   RHEUMATOID FACTOR 0 - 14 IU/mL 181 (H)   RHEUMATOID FACTOR, SERUM  Rpt !   Autoimmune disease workup in progress  ACE and ANCA are pending   Ordered anti-CCP level.  Since patient claims she has rheumatoid arthritis however never followed up with a rheumatologist did not know how diagnosis was made.

## 2023-11-18 NOTE — Progress Notes (Addendum)
 [facility] Progress Note    [name] [name], 68 y.o. female            MRN: [id number]   Date of Admission:  11/15/2023  Date of service:  11/18/2023         Room # 372/A              Hospital Day: 3   LOS: 3 days     CC: Dyspnea    Interval History:   68 year old female ex-smoker with suspected asthma/Chronic Obstructive Pulmonary Disease, diabetes and hypertension presented with bilateral lower extremity swelling and uncontrolled blood sugar as well as dyspnea on exertion.  Since admission patient was seen by pulmonologist.  Patient does not need follow up with pulmonology patient uses PRN inhaler.  No history of cardiac failure.  She quit smoking 26 years ago however smoked for 20 years 1 pack per day.    Also complain  lots of snoring never had sleep studies.  CT chest in the past revealed right upper nodularity 6 mm now increased to 8 mm also ground-glass opacity in left side -stable chronic.  Does not use home O2.    11/17/23: seen by Cardiology today for elevated RV systolic pressure.  Metoprolol  changed to Coreg.  Recommendation to avoid Norvasc  due to leg edema.    PFT did not show significant obstruction or restriction.    Probably need outpatient Rheumatology evaluation       11/18/2023 : Denies hemoptysis, fever or bleeding manifestation.  No prior history of invasive cardiac workup.  She had some weird sensation on both side of the neck when she took shower.  Try to wean oxygen.  Patient is feeling like mucus in her chest unable to cough out.  Evaluated by Pulmonary given albuterol  and acapella .  Atrovent Discontinued to prevent dryness of secretions.    Patient wants percussion vest  I do not think indicated it is up to the Pulmonary Service.      Objective:   Filed Vitals:    11/17/23 1800 11/17/23 1945 11/18/23 0408 11/18/23 0738   BP: (!) 125/45 (!) 134/43 (!) 138/46 (!) 151/70   Pulse: 59 66 51 55   Resp: 18 17 18 18    Temp:  36.2 C (97.2 F) 36 C (96.8 F) 36.1  C (97 F)   SpO2:  92% 94% 94%     Oxygen Device  SpO2: 94 %  Flow (L/min) (Oxygen Therapy): 2  Input/Output    Intake/Output Summary (Last 24 hours) at 11/18/2023 1108  Last data filed at 11/18/2023 1000  Gross per 24 hour   Intake 560 ml   Output --   Net 560 ml       Follow up Exam:  Body mass index is 33.66 kg/m. ,  obese built,  afebrile,  not dyspneic,  on room air  Short neck  CVS:  S1 S2  regular, distant,   No murmurs, 1+ ankle leg edema.  Res. Sys:  Diminished breath sounds,  No Rales,  No Wheezing.   GI:  Abdomen, obese,  no distension,  no tenderness,  no masses  Mus. Skele. Sys:  1+ pitting pedal and ankle leg Edema,  no Calf tenderness  Skin:  no Cyanosis,  no icterus.  No Rash  CNS:  AA x O3,  no focal deficits,  no tremors  Psych:  Somewhat anxious.  No hallucination    Labs:  CBC (Last 24 Hours):  No results for input(s): WBC, HGB, HCT, MCV, PLTCNT in the last 24 hours.      BMP (Last 24 Hours):    No results for input(s): SODIUM, POTASSIUM, CHLORIDE, CO2, BUN, CREATININE, CALCIUM, GLUCOSENF in the last 24 hours.      Results for orders placed or performed during the hospital encounter of 11/15/23 (from the past 24 hours)   POC BLOOD GLUCOSE (RESULTS)    Collection Time: 11/17/23 12:13 PM   Result Value Ref Range    GLUCOSE, POC 336 (H) 70 - 100 mg/dl   POC BLOOD GLUCOSE (RESULTS)    Collection Time: 11/17/23  5:36 PM   Result Value Ref Range    GLUCOSE, POC 274 (H) 70 - 100 mg/dl   POC BLOOD GLUCOSE (RESULTS)    Collection Time: 11/17/23  7:50 PM   Result Value Ref Range    GLUCOSE, POC 275 (H) 70 - 100 mg/dl   POC BLOOD GLUCOSE (RESULTS)    Collection Time: 11/18/23  6:12 AM   Result Value Ref Range    GLUCOSE, POC 195 (H) 70 - 100 mg/dl      Micro:   Hospital Encounter on 11/15/23 (from the past 96 hours)   ADULT ROUTINE BLOOD CULTURE, SET OF 2 BOTTLES (BACTERIA AND YEAST)    Collection Time: 11/16/23 12:20 AM    Specimen: Blood   Culture Result Status    BLOOD  CULTURE, ROUTINE No Growth 2 Days Preliminary   URINE CULTURE    Collection Time: 11/16/23  5:27 AM    Specimen: Urine, Site not specified   Culture Result Status    URINE CULTURE 15,000 CFU/mL Mixed Commensal Flora Final   RESPIRATORY CULTURE AND GRAM STAIN, AEROBIC    Collection Time: 11/16/23 10:34 PM    Specimen: Sputum; Other    Narrative    The following orders were created for panel order RESPIRATORY CULTURE AND GRAM STAIN, AEROBIC.  Procedure                               Abnormality         Status                     ---------                               -----------         ------                     SPUTUM [id number]]                                    Final result                 Please view results for these tests on the individual orders.   RESPIRATORY CULTURE AND GRAM STAIN (PERFORMABLE)    Collection Time: 11/16/23 10:34 PM    Specimen: Sputum   Culture Result Status    GRAM STAIN 1+ Rare Gram Negative Rod Preliminary    GRAM STAIN 4+ Many Gram Positive Cocci in Pairs Preliminary     Susceptibility data from last 90 days.  Collected Specimen Info Organism   11/16/23 Urine, Site not specified Mixed Commensal Flora  Imaging Studies:     No results found.        Assessment & Plan  Dyspnea  Dependent edema  Exertional shortness of breath likely multifactorial.  Troponin T within normal limit including BNP.    Echo revealed LVEF 60-65%.  Moderate LVH.  no significant valvular heart disease.    CTA angio chest no PE  Dependent edema could be due to hypertensive heart disease possibly secondary to Norvasc .  Evaluated by cardiologist for elevated RVSP.    However Echocardiogram showed estimated RVSP to be at upper limits of normal range with normal left ventricular systolic function.   Cardiology Coreg added instead of metoprolol  for better blood pressure control  Norvasc  discontinued due to ankle edema  Due to bradycardia Coreg has to be held  Acute hypoxic respiratory failure (CMS HCC)  Now  needing O2 via nasal cannula.  Oxygen has been weaned off  PFT studies report no significant obstruction restrictive pattern  Rheumatologist consulted.   Latest Reference Range & Units 11/16/23 10:41   ANTI-DNA AB Negative  Negative   CYCLIC CITRULLINATED PEPTIDE ANTIBODY IGG  <3.0 U/mL <0.5   CYCLIC CITRULLINATED PEPTIDE ANTIBODY IGG QUAL Negative  Negative   ANA SCREEN WITH REFLEX  Rpt   ANA SCREEN Negative  Negative   RNP ANTIBODIES, IGG, QUALITATIVE Negative  Negative   RNP ANTIBODIES, IGG, QUANTITATIVE <1.0 AAU/mL <0.2   SS-A/RO ANTIBODIES, IGG, QUALITATIVE Negative  Negative   SS-B/LA ANTIBODIES, IGG, QUALITATIVE Negative  Negative      Latest Reference Range & Units 11/16/23 10:41   RHEUMATOID FACTOR 0 - 14 IU/mL 181 (H)   RHEUMATOID FACTOR, SERUM  Rpt !   Autoimmune disease workup in progress  ACE and ANCA are pending   Ordered anti-CCP level.  Since patient claims she has rheumatoid arthritis however never followed up with a rheumatologist did not know how diagnosis was made.  Uncomplicated asthma, unspecified asthma severity, unspecified whether persistent  Chronic bronchitis  Had mild leukocytosis without obvious lung exam suggesting wheezing when patient is seen  ESR 41.  D-dimer 0.2.  Procalcitonin 0.06.  CRP 1.3.  ANA screening pending.  Continue DuoNeb PRN  Long-acting beta agonist and inhaled steroids.  Doxycycline  b.i.d.  Discontinued steroids  Lung nodules  Right upper lobe 8 mm pulmonary nodule Pulmonologist evaluated.  check interstitial lung disease panel as well  She will need PET scan in a few weeks to reassess lung nodularity and follow up .  Snoring  Outpatient sleep studies.  Probably has untreated sleep apnea  Diabetes mellitus type 2, insulin  dependent (CMS HCC)  A1c 9.7.  Uncontrolled with hyperglycemia.  Increase doses of Lantus  and sliding scale ADA diet  Hypertensive heart disease without heart failure  Primary hypertension  With echo evidence of LVH  Continue Coreg ,  hydrochlorothiazide and lisinopril   Patient was seen by cardiologist Dr. Jodean. no further inpatient workup required  Gastroesophageal reflux disease without esophagitis  Continue PPI     Antibiotics (From admission, onward)      Start     Stop Route Frequency    11/16/23 1100  doxycycline  hyclate (VIBRAMYCIN ) capsule         -- Oral 2 TIMES DAILY       Based on the clinical judgment and patient's condition, inpatient admission is deemed necessary to provide appropriate care and prevent further complications.  DVT/PE Prophylaxis: Enoxaparin   Current active medications:  acetaminophen  (TYLENOL ) tablet, 650 mg, Oral, Q4H PRN  budesonide  (PULMICORT   RESPULES) 0.5 mg/2 mL nebulizer suspension, 0.5 mg, Nebulization, 2x/day   And  arformoterol (BROVANA) 15 mcg/2 mL nebulizer solution, 15 mcg, Nebulization, 2x/day  aspirin  (ECOTRIN) enteric coated tablet 81 mg, 81 mg, Oral, Daily  carvedilol (COREG) tablet, 3.125 mg, Oral, 2x/day-Food  cetirizine (zyrTEC) tablet, 10 mg, Oral, Daily PRN  Correction/SSIP insulin  lispro 100 units/mL injection, 4-18 Units, Subcutaneous, 4x/day AC  cyclobenzaprine  (FLEXERIL ) tablet, 10 mg, Oral, 3x/day PRN  NS 250 mL flush bag, , Intravenous, Q15 Min PRN   And  D5W 250 mL flush bag, , Intravenous, Q15 Min PRN  dextromethorphan-guaiFENesin  (MUCINEX  DM) 30-600mg  per extended release tablet, 1 Tablet, Oral, Q12H  dextrose  (GLUTOSE) 40% oral gel, 15 g, Oral, Q15 Min PRN  dextrose  50% (0.5 g/mL) injection - syringe, 12.5 g, Intravenous, Q15 Min PRN  doxycycline  hyclate (VIBRAMYCIN ) capsule, 100 mg, Oral, 2x/day  enoxaparin  PF (LOVENOX ) 40 mg/0.4 mL SubQ injection, 40 mg, Subcutaneous, Q24H  fenofibrate (TRICOR) 145 mg tablet, 145 mg, Oral, Daily with Breakfast  fluticasone  (FLONASE ) 50 mcg per spray nasal spray, 2 Spray, Each Nostril, Daily  folic acid  (FOLVITE ) tablet, 1 mg, Oral, Daily  glucagon  (GLUCAGEN) injection 1 mg, 1 mg, IntraMUSCULAR, Once PRN  hydroCHLOROthiazide (HYDRODIURIL) tablet,  12.5 mg, Oral, Daily  insulin  glargine-yfgn 100 units/mL injection, 20 Units, Subcutaneous, 2x/day  ipratropium-albuterol  0.5 mg-3 mg(2.5 mg base)/3 mL Solution for Nebulization, 3 mL, Nebulization, Q4H PRN  lisinopril  (PRINIVIL ) tablet, 40 mg, Oral, Daily  NS flush syringe, 3 mL, Intracatheter, Q8HRS  NS flush syringe, 3 mL, Intracatheter, Q1H PRN  ondansetron  (ZOFRAN ) 2 mg/mL injection, 4 mg, Intravenous, Q6H PRN  pantoprazole  (PROTONIX ) delayed release tablet, 40 mg, Oral, Daily    [x]  I have independently reviewed and interpreted the patient's vitals, laboratory and imaging data, Medications, and physician's notes; and made appropriate changes in the orders and management plan.   [x]  I have discussed plausible action plan with the patient  and the RN.        Jariah Tarkowski, MD.

## 2023-11-18 NOTE — Progress Notes (Signed)
 [facility]  Progress Note    [name] [name]  Date of service: 11/18/2023   Date of Admission:  11/15/2023    Hospital Day:  LOS: 3 days     Chief complaint: congested, got dizzy earlier, coughed all night     Subjective:  [name] [name] is a 68 y.o., White female with past medical history of asthma hypertension who presents with leg swelling and shortness of breath.  She has shortness of breath with exertion, intermittent, improved with rest, associated for leg swelling, not associated with a wheeze or chest pain.  She has a history of asthma on PRN inhaler at home.  She is nonsmoker.  She denies respiratory exposures.  She has a history of several CTs over the past year which have shown right upper lobe nodularity up to 6 mm which now shows about 8 mm.  She has had some ground-glass opacity on the left side which has been stable.      10/10: room air. Still with dyspnea with minimal exertion. C/o swollen and palpitations.      10/11: room air. C/o coughed all night. States can't get sputum up.     ROS  Gen: No fever, aches, chills  HEENT: No headache ,nasal congestion, sore throat  Lungs: +intermittent SOB, + cough  Heart: No chest pain, palpitations, syncope  Skin: No rashes, lesions, pruritus      Vital Signs:  Temp  Avg: 36.1 C (97 F)  Min: 36 C (96.8 F)  Max: 36.2 C (97.2 F)    Pulse  Avg: 57  Min: 50  Max: 66 BP  Min: 119/63  Max: 151/70   Resp  Avg: 17.8  Min: 17  Max: 18 SpO2  Avg: 94.4 %  Min: 92 %  Max: 97 %          Input/Output    Intake/Output Summary (Last 24 hours) at 11/18/2023 1410  Last data filed at 11/18/2023 1000  Gross per 24 hour   Intake 320 ml   Output --   Net 320 ml    I/O last shift:  10/11 0700 - 10/11 1859  In: 120 [P.O.:120]  Out: -    acetaminophen  (TYLENOL ) tablet, 650 mg, Oral, Q4H PRN  albuterol  (PROVENTIL ) 2.5mg / 0.5 mL nebulizer solution, 2.5 mg, Nebulization, 4x/day  budesonide  (PULMICORT  RESPULES) 0.5 mg/2 mL nebulizer suspension, 0.5 mg,  Nebulization, 2x/day   And  arformoterol (BROVANA) 15 mcg/2 mL nebulizer solution, 15 mcg, Nebulization, 2x/day  aspirin  (ECOTRIN) enteric coated tablet 81 mg, 81 mg, Oral, Daily  carvedilol (COREG) tablet, 3.125 mg, Oral, 2x/day-Food  cetirizine (zyrTEC) tablet, 10 mg, Oral, Daily PRN  Correction/SSIP insulin  lispro 100 units/mL injection, 4-18 Units, Subcutaneous, 4x/day AC  cyclobenzaprine  (FLEXERIL ) tablet, 10 mg, Oral, 3x/day PRN  NS 250 mL flush bag, , Intravenous, Q15 Min PRN   And  D5W 250 mL flush bag, , Intravenous, Q15 Min PRN  dextromethorphan-guaiFENesin  (MUCINEX  DM) 30-600mg  per extended release tablet, 1 Tablet, Oral, Q12H  dextrose  (GLUTOSE) 40% oral gel, 15 g, Oral, Q15 Min PRN  dextrose  50% (0.5 g/mL) injection - syringe, 12.5 g, Intravenous, Q15 Min PRN  doxycycline  hyclate (VIBRAMYCIN ) capsule, 100 mg, Oral, 2x/day  enoxaparin  PF (LOVENOX ) 40 mg/0.4 mL SubQ injection, 40 mg, Subcutaneous, Q24H  fenofibrate (TRICOR) 145 mg tablet, 145 mg, Oral, Daily with Breakfast  fluticasone  (FLONASE ) 50 mcg per spray nasal spray, 2 Spray, Each Nostril, Daily  folic acid  (FOLVITE ) tablet, 1 mg, Oral,  Daily  glucagon  (GLUCAGEN) injection 1 mg, 1 mg, IntraMUSCULAR, Once PRN  guaiFENesin  (MUCINEX ) extended release tablet - for cough (expectorant), 600 mg, Oral, 2x/day  hydroCHLOROthiazide (HYDRODIURIL) tablet, 12.5 mg, Oral, Daily  insulin  glargine-yfgn 100 units/mL injection, 20 Units, Subcutaneous, 2x/day  lisinopril  (PRINIVIL ) tablet, 40 mg, Oral, Daily  NS flush syringe, 3 mL, Intracatheter, Q8HRS  NS flush syringe, 3 mL, Intracatheter, Q1H PRN  ondansetron  (ZOFRAN ) 2 mg/mL injection, 4 mg, Intravenous, Q6H PRN  pantoprazole  (PROTONIX ) delayed release tablet, 40 mg, Oral, Daily        Physical Exam:  Constitutional: awake, alert, appears stated age. No acute distress noted.    Eyes: Pupils equal and round, reactive to light and accomodation. , Sclera non-icteric. , normal findings: lids and lashes normal and  conjunctivae and sclerae normal  ENT: Head atraumatic and normocephalic, External Ears: normal pinnae shape and position and no signs of inflammation, Nose without erythema. , Mouth mucous membranes moist. , No oral lesions.   Neck: no thyromegaly or lymphadenopathy, supple, symmetrical, trachea midline, and no adenopathy.   Respiratory:   Clear.   Cardiovascular: regular rate and rhythm, S1, S2 normal.    Gastrointestinal: non-distended, Soft, non-tender, Bowel sounds normal, No hepatosplenomegaly, No masses.    Musculoskeletal:  moves all 4 extremities , no edema noted.   Integumentary:  Skin warm and dry and Skin color, texture, turgor normal. No rashes or lesions  Neurologic: Grossly normal.  Following commands, CN II - XII grossly intact , No tremor  Lymphatic/Immunologic/Hematologic: No lymphadenopathy  Psychiatric:  Pleasant affect.  Insight and judgment seem intact.    Labs:     Results for orders placed or performed during the hospital encounter of 11/15/23 (from the past 24 hours)   POC BLOOD GLUCOSE (RESULTS)   Result Value Ref Range    GLUCOSE, POC 274 (H) 70 - 100 mg/dl   POC BLOOD GLUCOSE (RESULTS)   Result Value Ref Range    GLUCOSE, POC 275 (H) 70 - 100 mg/dl   POC BLOOD GLUCOSE (RESULTS)   Result Value Ref Range    GLUCOSE, POC 195 (H) 70 - 100 mg/dl   POC BLOOD GLUCOSE (RESULTS)   Result Value Ref Range    GLUCOSE, POC 296 (H) 70 - 100 mg/dl      Radiology:      Results for orders placed or performed during the hospital encounter of 11/15/23 (from the past 2160 hours)   XR CHEST PA AND LATERAL     Status: None    Narrative    [name] [name]    XR CHEST PA AND LATERAL 11/15/2023 8:54 PM    CLINICAL HISTORY: dyspnea  Dyspnea    TECHNIQUE:  2 views of the chest were submitted on 2 images.    COMPARISON: November 09, 2023      FINDINGS:       Heart size normal.  No infiltrate, effusion or edema.  No pneumothorax.  No evidence of interstitial disease.  No focal or acute osseous abnormality.         Impression    No radiographic evidence of acute cardiopulmonary process.            Radiologist location ID: [id number]     CT ANGIO CHEST FOR PULMONARY EMBOLUS W IV CONTRAST     Status: None    Narrative    Bambi S Hartzell    CT ANGIO CHEST FOR PULMONARY EMBOLUS W IV CONTRAST performed on 11/16/2023 2:30  AM.    INDICATION:  Shortness a breath   Additional History:  dyspnea    TECHNIQUE:  CT angiography of the chest performed for evaluation of the pulmonary arteries, with axial, sagittal, and coronal multiplanar reformations and a coronal 3D MIP.  Dose modulation, automated exposure control, and/or iterative reconstruction were used for dose reduction.    CONTRAST: 100 mL of Isovue 370 IV    COMPARISON:  CT 11/09/2023  ___________________________________  FINDINGS:    HEART / GREAT VESSELS: There are no filling defects in the pulmonary arteries to suggest a pulmonary embolism.  The heart is normal in size without pericardial effusion.  Atherosclerotic calcification of the thoracic aorta without aneurysmal dilation. Mild atherosclerotic calcification of the aortic annulus.SABRA     LUNGS / AIRWAYS: Solid nodule in the right upper lobe adjacent to the fissure measuring approximately 8 x 6 mm similar to 11/09/2023 CT. Adjacent 3 mm. perifissural nodule (series 6, image 23) which may represent lymph node. Elongated left upper lobe groundglass opacity similar to 11/09/2023 CT. Negative for pleural effusion or pneumothorax.  The central airways are patent.    MEDIASTINUM: There is no hilar or mediastinal lymphadenopathy.    UPPER ABDOMEN: Negative for acute abnormality.     BONES:  Degenerative changes of the thoracic spine.. Negative for acute abnormality    OTHER:  The visualized thyroid gland is unremarkable.  ___________________________________    Impression    1. Negative for acute pulmonary embolus.  2. Solid pulmonary nodule in the right upper lobe measuring 8 mm. Findings may be further evaluated with PET CT or  follow-up CT chest.  3. Similar appearance of elongated mixed solid and groundglass opacity in the left upper lobe which may also be further evaluated with PET CT.      Radiologist location ID: [id number]        Consults:   Assessment/ Plan:   Active Hospital Problems    Diagnosis    Primary Problem: Dyspnea    Acute hypoxic respiratory failure (CMS HCC)    Chronic bronchitis    Snoring    Diabetes mellitus type 2, insulin  dependent (CMS HCC)    Hypertensive heart disease without heart failure    Gastroesophageal reflux disease without esophagitis    Primary hypertension    Dependent edema    Uncomplicated asthma, unspecified asthma severity, unspecified whether persistent    Lung nodules     Patient seen and examined  Chart reviewed  Tele shows sinus  CT chest shows 8mm RUL nodule  68 y/o presents with SOB and swelling  PFT shows small airway disease but no overt restriction/obstruction  ILD lab w/u showing RF of 181. ACE and ANCA are pending. Other labs pending.  Have asked for rheum eval - pending  Echo notes that her RVSP was elevated and moderate LVH  Appreciate cards input. Have adjusted BP meds  Pt with bradycardia today - pt notes dizziness while trying to shower earlier  Suspect 2/2 symptomatic bradycardia  Pt reports coughing all night and can't expectorate sputum  Will stop ipratropium and add plain albuterol  around the clock  Add mucinex   Add acapella to regimen to help with mucus clearance as well  Will need PSG in the future as well  Will also need PET in few weeks for RUL nodule f/u  Will discuss with my attending      Larraine LITTIE Marc, APRN, CNP,

## 2023-11-19 ENCOUNTER — Telehealth (HOSPITAL_COMMUNITY): Payer: Self-pay | Admitting: Family

## 2023-11-19 ENCOUNTER — Other Ambulatory Visit: Payer: Self-pay

## 2023-11-19 DIAGNOSIS — R001 Bradycardia, unspecified: Secondary | ICD-10-CM

## 2023-11-19 DIAGNOSIS — J418 Mixed simple and mucopurulent chronic bronchitis: Secondary | ICD-10-CM

## 2023-11-19 LAB — ECG 12 LEAD
Atrial Rate: 50 {beats}/min
Calculated P Axis: 66 degrees
Calculated R Axis: 14 degrees
Calculated T Axis: 0 degrees
PR Interval: 164 ms
QRS Duration: 78 ms
QT Interval: 408 ms
QTC Calculation: 371 ms
Ventricular rate: 50 {beats}/min

## 2023-11-19 LAB — POC BLOOD GLUCOSE (RESULTS)
GLUCOSE, POC: 193 mg/dL — ABNORMAL HIGH (ref 70–100)
GLUCOSE, POC: 269 mg/dL — ABNORMAL HIGH (ref 70–100)
GLUCOSE, POC: 198 mg/dL — ABNORMAL HIGH (ref 70–100)
GLUCOSE, POC: 269 mg/dL — ABNORMAL HIGH (ref 70–100)

## 2023-11-19 LAB — ANGIOTENSIN CONVERTING ENZYME (ACE), SERUM: ANGIOTENSIN CONV ENZYME: 5 U/L — ABNORMAL LOW (ref 9–67)

## 2023-11-19 MED ORDER — BUDESONIDE-FORMOTEROL HFA 160 MCG-4.5 MCG/ACTUATION AEROSOL INHALER
2.0000 | INHALATION_SPRAY | Freq: Two times a day (BID) | RESPIRATORY_TRACT | 3 refills | Status: DC
Start: 2023-11-19 — End: 2023-12-19
  Filled 2023-11-19: qty 10.2, 30d supply, fill #0

## 2023-11-19 MED ORDER — ALBUTEROL SULFATE 0.63 MG/3 ML SOLUTION FOR NEBULIZATION
0.6300 mg | INHALATION_SOLUTION | Freq: Four times a day (QID) | RESPIRATORY_TRACT | 3 refills | Status: AC | PRN
Start: 2023-11-19 — End: ?
  Filled 2023-11-19: qty 75, 7d supply, fill #0

## 2023-11-19 MED ORDER — HYDROCHLOROTHIAZIDE 25 MG TABLET
25.0000 mg | ORAL_TABLET | Freq: Every day | ORAL | Status: DC
Start: 2023-11-20 — End: 2023-11-21
  Administered 2023-11-20 – 2023-11-21 (×2): 25 mg via ORAL
  Filled 2023-11-19 (×2): qty 1

## 2023-11-19 NOTE — Care Plan (Signed)
 Alert and oriented x 4. Is now on room air and sats well. Gets short of breath when walks. Sinus rhythm on tele to brady at times, 50's. Is up with standby assist. Blood sugar runs high. Denies pain this shift. Daughter stayed in room last night and helps with needs.   Berwyn Rhyme, RN  11/19/2023 06:43    Problem: Adult Inpatient Plan of Care  Goal: Plan of Care Review  Outcome: Ongoing (see interventions/notes)  Goal: Patient-Specific Goal (Individualized)  Outcome: Ongoing (see interventions/notes)  Flowsheets (Taken 11/18/2023 2108)  Individualized Care Needs: all need to be addressed  Anxieties, Fears or Concerns: breathing treatments  Goal: Absence of Hospital-Acquired Illness or Injury  Outcome: Ongoing (see interventions/notes)  Intervention: Identify and Manage Fall Risk  Recent Flowsheet Documentation  Taken 11/18/2023 2108 by Berwyn MATSU, RN  Safety Promotion/Fall Prevention:   fall prevention program maintained   nonskid shoes/slippers when out of bed   safety round/check completed   activity supervised  Intervention: Prevent Skin Injury  Recent Flowsheet Documentation  Taken 11/18/2023 2108 by Berwyn MATSU, RN  Body Position: sitting  Skin Protection: adhesive use limited  Intervention: Prevent and Manage VTE (Venous Thromboembolism) Risk  Recent Flowsheet Documentation  Taken 11/18/2023 2108 by Berwyn MATSU, RN  VTE Prevention/Management: anticoagulant therapy maintained  Intervention: Prevent Infection  Recent Flowsheet Documentation  Taken 11/18/2023 2108 by Berwyn MATSU, RN  Infection Prevention: cohorting utilized  Goal: Optimal Comfort and Wellbeing  Outcome: Ongoing (see interventions/notes)  Intervention: Provide Person-Centered Care  Recent Flowsheet Documentation  Taken 11/18/2023 2108 by Berwyn MATSU, RN  Trust Relationship/Rapport:   questions answered   questions encouraged   reassurance provided  Goal: Rounds/Family Conference  Outcome: Ongoing (see interventions/notes)

## 2023-11-19 NOTE — Nurses Notes (Deleted)
 6 min walk test completed, Pt de sated to 94% on pulse ox while walking but remained between 96-99%. Pt did experience some exertional dyspnea.  Ileana Sides, RN

## 2023-11-19 NOTE — Assessment & Plan Note (Addendum)
 With echo evidence of LVH  Continue , hydrochlorothiazide and lisinopril   Patient was seen by cardiologist Dr. Jodean. no further inpatient workup required

## 2023-11-19 NOTE — Nurses Notes (Signed)
 6 min walk test completed by Nurse. Pt de sated to 85%. Pt had to stop several times to catch her breath because of exertional dyspnea.  Ileana Sides, RN

## 2023-11-19 NOTE — Assessment & Plan Note (Signed)
 Exertional shortness of breath likely multifactorial.  Troponin T within normal limit including BNP.    Echo revealed LVEF 60-65%.  Moderate LVH.  no significant valvular heart disease.    CTA angio chest no PE  Dependent edema could be due to hypertensive heart disease possibly secondary to Norvasc .  Evaluated by cardiologist for elevated RVSP.    However Echocardiogram showed estimated RVSP to be at upper limits of normal range with normal left ventricular systolic function.   Cardiology Coreg added instead of metoprolol  for better blood pressure control  Norvasc  discontinued due to ankle edema  Due to bradycardia Coreg has to be held

## 2023-11-19 NOTE — Assessment & Plan Note (Signed)
 Had mild leukocytosis without obvious lung exam suggesting wheezing when patient is seen  ESR 41.  D-dimer 0.2.  Procalcitonin 0.06.  CRP 1.3.  ANA screening pending.  Continue DuoNeb PRN  Long-acting beta agonist and inhaled steroids.  Doxycycline  b.i.d.  Discontinued steroids

## 2023-11-19 NOTE — Assessment & Plan Note (Signed)
 Now needing O2 via nasal cannula.  Oxygen has been weaned off  PFT studies report no significant obstruction restrictive pattern  Rheumatologist consulted.   Latest Reference Range & Units 11/16/23 10:41   ANTI-DNA AB Negative  Negative   CYCLIC CITRULLINATED PEPTIDE ANTIBODY IGG  <3.0 U/mL <0.5   CYCLIC CITRULLINATED PEPTIDE ANTIBODY IGG QUAL Negative  Negative   ANA SCREEN WITH REFLEX  Rpt   ANA SCREEN Negative  Negative   RNP ANTIBODIES, IGG, QUALITATIVE Negative  Negative   RNP ANTIBODIES, IGG, QUANTITATIVE <1.0 AAU/mL <0.2   SS-A/RO ANTIBODIES, IGG, QUALITATIVE Negative  Negative   SS-B/LA ANTIBODIES, IGG, QUALITATIVE Negative  Negative      Latest Reference Range & Units 11/16/23 10:41   RHEUMATOID FACTOR 0 - 14 IU/mL 181 (H)   RHEUMATOID FACTOR, SERUM  Rpt !   Autoimmune disease workup in progress  ACE and ANCA are pending   Ordered anti-CCP level.  Since patient claims she has rheumatoid arthritis however never followed up with a rheumatologist did not know how diagnosis was made.

## 2023-11-19 NOTE — Telephone Encounter (Signed)
 Need PET scan and 1-2 week f/u please

## 2023-11-19 NOTE — Assessment & Plan Note (Signed)
 Right upper lobe 8 mm pulmonary nodule Pulmonologist evaluated.  check interstitial lung disease panel as well  She will need PET scan in a few weeks to reassess lung nodularity and follow up .

## 2023-11-19 NOTE — Progress Notes (Addendum)
 [facility]  Progress Note    [name] [name], 68 y.o. female  Date of Birth:  02-05-1956  Encounter Start Date:  11/15/2023  Inpatient Admission Date: 11/15/2023  Date of service: 11/19/2023       Interim Update  68 year old female ex-smoker with suspected asthma/Chronic Obstructive Pulmonary Disease, diabetes and hypertension presented with bilateral lower extremity swelling and uncontrolled blood sugar as well as dyspnea on exertion.  Since admission patient was seen by pulmonologist.  Patient does not follow up with pulmonology patient uses PRN inhaler.  No history of cardiac failure. She quit smoking 26 years ago however smoked for 20 years 1 pack per day.  Also complain  lots of snoring never had sleep studies.  CT chest in the past revealed right upper nodularity 6 mm now increased to 8 mm also ground-glass opacity in left side -stable chronic.Does not use home O2.     11/17/23: seen by Cardiology today for elevated RV systolic pressure.  Metoprolol  changed to Coreg.  Recommendation to avoid Norvasc  due to leg edema.    PFT did not show significant obstruction or restriction.  Probably need outpatient Rheumatology evaluation     11/18/2023 : Denies hemoptysis, fever or bleeding manifestation.  No prior history of invasive cardiac workup.  She had some weird sensation on both side of the neck when she took shower.  Try to wean oxygen. Patient is feeling like mucus in her chest unable to cough out.  Evaluated by Pulmonary given albuterol  and acapella .  Atrovent Discontinued to prevent dryness of secretions.     Patient reports her dyspnea has improved today.  Denied much cough..  Reports heart rate dropped to 30s while asleep.    acetaminophen  (TYLENOL ) tablet, 650 mg, Oral, Q4H PRN  albuterol  (PROVENTIL ) 2.5mg / 0.5 mL nebulizer solution, 2.5 mg, Nebulization, 4x/day  budesonide  (PULMICORT  RESPULES) 0.5 mg/2 mL nebulizer suspension, 0.5 mg, Nebulization, 2x/day   And  arformoterol  (BROVANA) 15 mcg/2 mL nebulizer solution, 15 mcg, Nebulization, 2x/day  aspirin  (ECOTRIN) enteric coated tablet 81 mg, 81 mg, Oral, Daily  benzonatate  (TESSALON ) capsule, 100 mg, Oral, Q8H PRN  carvedilol (COREG) tablet, 3.125 mg, Oral, 2x/day-Food  cetirizine (zyrTEC) tablet, 10 mg, Oral, Daily PRN  Correction/SSIP insulin  lispro 100 units/mL injection, 4-18 Units, Subcutaneous, 4x/day AC  cyclobenzaprine  (FLEXERIL ) tablet, 10 mg, Oral, 3x/day PRN  NS 250 mL flush bag, , Intravenous, Q15 Min PRN   And  D5W 250 mL flush bag, , Intravenous, Q15 Min PRN  dextrose  (GLUTOSE) 40% oral gel, 15 g, Oral, Q15 Min PRN  dextrose  50% (0.5 g/mL) injection - syringe, 12.5 g, Intravenous, Q15 Min PRN  doxycycline  hyclate (VIBRAMYCIN ) capsule, 100 mg, Oral, 2x/day  enoxaparin  PF (LOVENOX ) 40 mg/0.4 mL SubQ injection, 40 mg, Subcutaneous, Q24H  fenofibrate (TRICOR) 145 mg tablet, 145 mg, Oral, Daily with Breakfast  fluticasone  (FLONASE ) 50 mcg per spray nasal spray, 2 Spray, Each Nostril, Daily  folic acid  (FOLVITE ) tablet, 1 mg, Oral, Daily  glucagon  (GLUCAGEN) injection 1 mg, 1 mg, IntraMUSCULAR, Once PRN  guaiFENesin  (MUCINEX ) extended release tablet - for cough (expectorant), 600 mg, Oral, 2x/day  hydroCHLOROthiazide (HYDRODIURIL) tablet, 12.5 mg, Oral, Daily  insulin  glargine-yfgn 100 units/mL injection, 20 Units, Subcutaneous, 2x/day  lisinopril  (PRINIVIL ) tablet, 40 mg, Oral, Daily  NS flush syringe, 3 mL, Intracatheter, Q8HRS  NS flush syringe, 3 mL, Intracatheter, Q1H PRN  ondansetron  (ZOFRAN ) 2 mg/mL injection, 4 mg, Intravenous, Q6H PRN  pantoprazole  (PROTONIX ) delayed release tablet, 40  mg, Oral, Daily            Vital Signs:    Filed Vitals:    11/19/23 0357 11/19/23 0752 11/19/23 0754 11/19/23 0809   BP: (!) 149/47 (!) 166/60 (!) (P) 160/93    Pulse: 57 56 (P) 92    Resp: 18 18 (P) 18    Temp: 36.1 C (97 F) 36.2 C (97.2 F) (P) 36.3 C (97.3 F)    SpO2: 93% 95% (P) 96% 97%      Body mass index is 33.66  kg/m.    Physical Exam:  Body mass index is 33.66 kg/m. ,  obese built,  afebrile,  not dyspneic,  on room air  Short neck  CVS:  S1 S2  regular, distant,   No murmurs, 1+ ankle leg edema.  Res. Sys:  Diminished breath sounds,  No Rales,  No Wheezing.   GI:  Abdomen, obese,  no distension,  no tenderness,  no masses  Mus. Skele. Sys:  1+ pitting pedal and ankle leg Edema,  no Calf tenderness  Skin:  no Cyanosis,  no icterus.  No Rash  CNS:  AA x O3,  no focal deficits,  no tremors  Psych:  Somewhat anxious.  No hallucination    Studies:  I have reviewed all available studies within the electronic medical record.    Results for orders placed or performed during the hospital encounter of 11/15/23 (from the past 24 hours)   POC BLOOD GLUCOSE (RESULTS)   Result Value Ref Range    GLUCOSE, POC 296 (H) 70 - 100 mg/dl   POC BLOOD GLUCOSE (RESULTS)   Result Value Ref Range    GLUCOSE, POC 293 (H) 70 - 100 mg/dl   POC BLOOD GLUCOSE (RESULTS)   Result Value Ref Range    GLUCOSE, POC 166 (H) 70 - 100 mg/dl   POC BLOOD GLUCOSE (RESULTS)   Result Value Ref Range    GLUCOSE, POC 277 (H) 70 - 100 mg/dl   POC BLOOD GLUCOSE (RESULTS)   Result Value Ref Range    GLUCOSE, POC 193 (H) 70 - 100 mg/dl     No results found. .    Assessment and Plan:  Assessment & Plan  Dyspnea  Dependent edema  Exertional shortness of breath likely multifactorial.  Troponin T within normal limit including BNP.    Echo revealed LVEF 60-65%.  Moderate LVH.  no significant valvular heart disease.    CTA angio chest no PE  Dependent edema could be due to hypertensive heart disease possibly secondary to Norvasc .  Evaluated by cardiologist for elevated RVSP.    However Echocardiogram showed estimated RVSP to be at upper limits of normal range with normal left ventricular systolic function.   Cardiology Coreg added instead of metoprolol  for better blood pressure control  Norvasc  discontinued due to ankle edema  Due to bradycardia Coreg has to be held  Acute  hypoxic respiratory failure (CMS HCC)  Now needing O2 via nasal cannula.  Oxygen has been weaned off  PFT studies report no significant obstruction restrictive pattern  Rheumatologist consulted.   Latest Reference Range & Units 11/16/23 10:41   ANTI-DNA AB Negative  Negative   CYCLIC CITRULLINATED PEPTIDE ANTIBODY IGG  <3.0 U/mL <0.5   CYCLIC CITRULLINATED PEPTIDE ANTIBODY IGG QUAL Negative  Negative   ANA SCREEN WITH REFLEX  Rpt   ANA SCREEN Negative  Negative   RNP ANTIBODIES, IGG, QUALITATIVE Negative  Negative   RNP ANTIBODIES,  IGG, QUANTITATIVE <1.0 AAU/mL <0.2   SS-A/RO ANTIBODIES, IGG, QUALITATIVE Negative  Negative   SS-B/LA ANTIBODIES, IGG, QUALITATIVE Negative  Negative      Latest Reference Range & Units 11/16/23 10:41   RHEUMATOID FACTOR 0 - 14 IU/mL 181 (H)   RHEUMATOID FACTOR, SERUM  Rpt !   Autoimmune disease workup in progress  ACE and ANCA are pending   Ordered anti-CCP level.  Since patient claims she has rheumatoid arthritis however never followed up with a rheumatologist did not know how diagnosis was made.  Uncomplicated asthma, unspecified asthma severity, unspecified whether persistent  Chronic bronchitis  Had mild leukocytosis without obvious lung exam suggesting wheezing when patient is seen  ESR 41.  D-dimer 0.2.  Procalcitonin 0.06.  CRP 1.3.  ANA screening pending.  Continue DuoNeb PRN  Long-acting beta agonist and inhaled steroids.  Doxycycline  b.i.d.  Discontinued steroids  Lung nodules  Right upper lobe 8 mm pulmonary nodule Pulmonologist evaluated.  check interstitial lung disease panel as well  She will need PET scan in a few weeks to reassess lung nodularity and follow up .  Snoring  Outpatient sleep studies.  Probably has untreated sleep apnea  Obtain overnight pulse oximetry, 6 minute test  Diabetes mellitus type 2, insulin  dependent (CMS HCC)  A1c 9.7.  Uncontrolled with hyperglycemia.  Increase doses of Lantus  and sliding scale ADA diet  Hypertensive heart disease without heart  failure  Primary hypertension  With echo evidence of LVH  Continue , hydrochlorothiazide and lisinopril   Patient was seen by cardiologist Dr. Jodean. no further inpatient workup required  Gastroesophageal reflux disease without esophagitis  Continue PPI       [name] Critzer R Eilish Mcdaniel, MD

## 2023-11-19 NOTE — Progress Notes (Signed)
 [facility]  Progress Note    [name] [name] Fuller  Date of service: 11/19/2023   Date of Admission:  11/15/2023    Hospital Day:  LOS: 4 days     Chief complaint: improved breathing today. Concerned about bradycardia    Subjective:   [name] [name] is a 68 y.o., White female with past medical history of asthma hypertension who presents with leg swelling and shortness of breath.  She has shortness of breath with exertion, intermittent, improved with rest, associated for leg swelling, not associated with a wheeze or chest pain.  She has a history of asthma on PRN inhaler at home.  She is nonsmoker.  She denies respiratory exposures.  She has a history of several CTs over the past year which have shown right upper lobe nodularity up to 6 mm which now shows about 8 mm.  She has had some ground-glass opacity on the left side which has been stable.      10/10: room air. Still with dyspnea with minimal exertion. C/o swollen and palpitations.      10/11: room air. C/o coughed all night. States can't get sputum up.     10/12: room air. Has mobilized some mucus since yesterday. Breathing is better. HR dropped to 30's while asleep today     ROS  Gen: No fever, aches, chills  HEENT: No headache ,nasal congestion, sore throat  Lungs: +intermittent SOB, + cough  Heart: No chest pain, palpitations, syncope  Skin: No rashes, lesions, pruritus       Vital Signs:  Temp  Avg: 36.2 C (97.2 F)  Min: 36.1 C (97 F)  Max: 36.3 C (97.3 F)    Pulse  Avg: 63.1  Min: 50  Max: 92 BP  Min: 111/44  Max: 166/60   Resp  Avg: 17.9  Min: 17  Max: 18 SpO2  Avg: 95.2 %  Min: 92 %  Max: 98 %          Input/Output    Intake/Output Summary (Last 24 hours) at 11/19/2023 1144  Last data filed at 11/18/2023 1400  Gross per 24 hour   Intake 240 ml   Output --   Net 240 ml    I/O last shift:  No intake/output data recorded.   acetaminophen  (TYLENOL ) tablet, 650 mg, Oral, Q4H PRN  albuterol  (PROVENTIL ) 2.5mg / 0.5 mL nebulizer solution, 2.5  mg, Nebulization, 4x/day  budesonide  (PULMICORT  RESPULES) 0.5 mg/2 mL nebulizer suspension, 0.5 mg, Nebulization, 2x/day   And  arformoterol (BROVANA) 15 mcg/2 mL nebulizer solution, 15 mcg, Nebulization, 2x/day  aspirin  (ECOTRIN) enteric coated tablet 81 mg, 81 mg, Oral, Daily  benzonatate  (TESSALON ) capsule, 100 mg, Oral, Q8H PRN  carvedilol (COREG) tablet, 3.125 mg, Oral, 2x/day-Food  cetirizine (zyrTEC) tablet, 10 mg, Oral, Daily PRN  Correction/SSIP insulin  lispro 100 units/mL injection, 4-18 Units, Subcutaneous, 4x/day AC  cyclobenzaprine  (FLEXERIL ) tablet, 10 mg, Oral, 3x/day PRN  NS 250 mL flush bag, , Intravenous, Q15 Min PRN   And  D5W 250 mL flush bag, , Intravenous, Q15 Min PRN  dextrose  (GLUTOSE) 40% oral gel, 15 g, Oral, Q15 Min PRN  dextrose  50% (0.5 g/mL) injection - syringe, 12.5 g, Intravenous, Q15 Min PRN  doxycycline  hyclate (VIBRAMYCIN ) capsule, 100 mg, Oral, 2x/day  enoxaparin  PF (LOVENOX ) 40 mg/0.4 mL SubQ injection, 40 mg, Subcutaneous, Q24H  fenofibrate (TRICOR) 145 mg tablet, 145 mg, Oral, Daily with Breakfast  fluticasone  (FLONASE ) 50 mcg per spray nasal spray, 2 Spray, Each  Nostril, Daily  folic acid  (FOLVITE ) tablet, 1 mg, Oral, Daily  glucagon  (GLUCAGEN) injection 1 mg, 1 mg, IntraMUSCULAR, Once PRN  guaiFENesin  (MUCINEX ) extended release tablet - for cough (expectorant), 600 mg, Oral, 2x/day  [START ON 11/20/2023] hydroCHLOROthiazide (HYDRODIURIL) tablet, 25 mg, Oral, Daily  insulin  glargine-yfgn 100 units/mL injection, 20 Units, Subcutaneous, 2x/day  lisinopril  (PRINIVIL ) tablet, 40 mg, Oral, Daily  NS flush syringe, 3 mL, Intracatheter, Q8HRS  NS flush syringe, 3 mL, Intracatheter, Q1H PRN  ondansetron  (ZOFRAN ) 2 mg/mL injection, 4 mg, Intravenous, Q6H PRN  pantoprazole  (PROTONIX ) delayed release tablet, 40 mg, Oral, Daily        Physical Exam:  Constitutional: awake, alert, appears stated age. No acute distress noted.    Eyes: Pupils equal and round, reactive to light and  accomodation. , Sclera non-icteric. , normal findings: lids and lashes normal and conjunctivae and sclerae normal  ENT: Head atraumatic and normocephalic, External Ears: normal pinnae shape and position and no signs of inflammation, Nose without erythema. , Mouth mucous membranes moist. , No oral lesions.   Neck: no thyromegaly or lymphadenopathy, supple, symmetrical, trachea midline, and no adenopathy.   Respiratory:   Clear.   Cardiovascular: regular rate and rhythm, S1, S2 normal.    Gastrointestinal: non-distended, Soft, non-tender, Bowel sounds normal, No hepatosplenomegaly, No masses.    Musculoskeletal:  moves all 4 extremities , no edema noted.   Integumentary:  Skin warm and dry and Skin color, texture, turgor normal. No rashes or lesions  Neurologic: Grossly normal.  Following commands, CN II - XII grossly intact , No tremor  Lymphatic/Immunologic/Hematologic: No lymphadenopathy  Psychiatric:  Pleasant affect.  Insight and judgment seem intact.       Labs:     Results for orders placed or performed during the hospital encounter of 11/15/23 (from the past 24 hours)   POC BLOOD GLUCOSE (RESULTS)   Result Value Ref Range    GLUCOSE, POC 293 (H) 70 - 100 mg/dl   POC BLOOD GLUCOSE (RESULTS)   Result Value Ref Range    GLUCOSE, POC 166 (H) 70 - 100 mg/dl   POC BLOOD GLUCOSE (RESULTS)   Result Value Ref Range    GLUCOSE, POC 277 (H) 70 - 100 mg/dl   POC BLOOD GLUCOSE (RESULTS)   Result Value Ref Range    GLUCOSE, POC 193 (H) 70 - 100 mg/dl   POC BLOOD GLUCOSE (RESULTS)   Result Value Ref Range    GLUCOSE, POC 269 (H) 70 - 100 mg/dl      Radiology:      Results for orders placed or performed during the hospital encounter of 11/15/23 (from the past 2160 hours)   XR CHEST PA AND LATERAL     Status: None    Narrative    [name] [name]    XR CHEST PA AND LATERAL 11/15/2023 8:54 PM    CLINICAL HISTORY: dyspnea  Dyspnea    TECHNIQUE:  2 views of the chest were submitted on 2 images.    COMPARISON: November 09, 2023       FINDINGS:       Heart size normal.  No infiltrate, effusion or edema.  No pneumothorax.  No evidence of interstitial disease.  No focal or acute osseous abnormality.        Impression    No radiographic evidence of acute cardiopulmonary process.            Radiologist location ID: [id number]     CT ANGIO  CHEST FOR PULMONARY EMBOLUS W IV CONTRAST     Status: None    Narrative    Tressy S Heinlen    CT ANGIO CHEST FOR PULMONARY EMBOLUS W IV CONTRAST performed on 11/16/2023 2:30 AM.    INDICATION:  Shortness a breath   Additional History:  dyspnea    TECHNIQUE:  CT angiography of the chest performed for evaluation of the pulmonary arteries, with axial, sagittal, and coronal multiplanar reformations and a coronal 3D MIP.  Dose modulation, automated exposure control, and/or iterative reconstruction were used for dose reduction.    CONTRAST: 100 mL of Isovue 370 IV    COMPARISON:  CT 11/09/2023  ___________________________________  FINDINGS:    HEART / GREAT VESSELS: There are no filling defects in the pulmonary arteries to suggest a pulmonary embolism.  The heart is normal in size without pericardial effusion.  Atherosclerotic calcification of the thoracic aorta without aneurysmal dilation. Mild atherosclerotic calcification of the aortic annulus.SABRA     LUNGS / AIRWAYS: Solid nodule in the right upper lobe adjacent to the fissure measuring approximately 8 x 6 mm similar to 11/09/2023 CT. Adjacent 3 mm. perifissural nodule (series 6, image 23) which may represent lymph node. Elongated left upper lobe groundglass opacity similar to 11/09/2023 CT. Negative for pleural effusion or pneumothorax.  The central airways are patent.    MEDIASTINUM: There is no hilar or mediastinal lymphadenopathy.    UPPER ABDOMEN: Negative for acute abnormality.     BONES:  Degenerative changes of the thoracic spine.. Negative for acute abnormality    OTHER:  The visualized thyroid gland is unremarkable.  ___________________________________     Impression    1. Negative for acute pulmonary embolus.  2. Solid pulmonary nodule in the right upper lobe measuring 8 mm. Findings may be further evaluated with PET CT or follow-up CT chest.  3. Similar appearance of elongated mixed solid and groundglass opacity in the left upper lobe which may also be further evaluated with PET CT.      Radiologist location ID: [id number]        Consults:   Assessment/ Plan:   Active Hospital Problems    Diagnosis    Primary Problem: Dyspnea    Acute hypoxic respiratory failure (CMS HCC)    Chronic bronchitis    Snoring    Diabetes mellitus type 2, insulin  dependent (CMS HCC)    Hypertensive heart disease without heart failure    Gastroesophageal reflux disease without esophagitis    Primary hypertension    Dependent edema    Uncomplicated asthma, unspecified asthma severity, unspecified whether persistent    Lung nodules     Patient seen and examined  Chart reviewed  Tele shows sinus  CT chest shows 8mm RUL nodule  68 y/o presents with SOB and swelling  PFT shows small airway disease but no overt restriction/obstruction  ILD lab w/u showing RF of 181. ACE and ANCA are pending. Other labs pending.  Have asked for rheum eval - pending  Echo notes that her RVSP was elevated and moderate LVH  Appreciate cards input. Have adjusted BP meds  Continues to c/o bradycardia and states her HR dropped to 30's while asleep today  Will check ONPO tonight to see if can qualify for nocturnal O2  Pt feels she can move mucus better today  Continue acapella and mucinex   Day 4 empiric doxy- sputum culture is pending  will also need PET in few weeks for RUL nodule f/u  Have eRx  Symbicort  to pharmacy to have at d/c, as well as albuterol  nebs  Stable for d/c from pulm standpoint. Have sent f/u in to our office to have PET and short term f/u visit.   Will discuss with my attending         Larraine LITTIE Marc, APRN, CNP,

## 2023-11-19 NOTE — Assessment & Plan Note (Addendum)
 Outpatient sleep studies.  Probably has untreated sleep apnea  Obtain overnight pulse oximetry, 6 minute test

## 2023-11-19 NOTE — Assessment & Plan Note (Signed)
 Continue PPI.

## 2023-11-19 NOTE — Care Plan (Signed)
 Problem: Adult Inpatient Plan of Care  Goal: Plan of Care Review  Outcome: Ongoing (see interventions/notes)  Goal: Patient-Specific Goal (Individualized)  Outcome: Ongoing (see interventions/notes)  Goal: Absence of Hospital-Acquired Illness or Injury  Outcome: Ongoing (see interventions/notes)  Goal: Optimal Comfort and Wellbeing  Outcome: Ongoing (see interventions/notes)  Goal: Rounds/Family Conference  Outcome: Ongoing (see interventions/notes)     Problem: Adult Inpatient Plan of Care  Goal: Plan of Care Review  Outcome: Ongoing (see interventions/notes)  Goal: Patient-Specific Goal (Individualized)  Outcome: Ongoing (see interventions/notes)  Goal: Absence of Hospital-Acquired Illness or Injury  Outcome: Ongoing (see interventions/notes)  Goal: Optimal Comfort and Wellbeing  Outcome: Ongoing (see interventions/notes)  Goal: Rounds/Family Conference  Outcome: Ongoing (see interventions/notes)     Problem: Fall Injury Risk  Goal: Absence of Fall and Fall-Related Injury  Outcome: Ongoing (see interventions/notes)     Problem: Gas Exchange Impaired  Goal: Optimal Gas Exchange  Outcome: Ongoing (see interventions/notes)     Problem: VTE (Venous Thromboembolism)  Goal: Tissue Perfusion  Outcome: Ongoing (see interventions/notes)  Goal: Optimal Right Ventricular Function  Outcome: Ongoing (see interventions/notes)

## 2023-11-19 NOTE — Assessment & Plan Note (Signed)
 A1c 9.7.  Uncontrolled with hyperglycemia.  Increase doses of Lantus  and sliding scale ADA diet

## 2023-11-20 ENCOUNTER — Telehealth (INDEPENDENT_AMBULATORY_CARE_PROVIDER_SITE_OTHER): Payer: Self-pay | Admitting: PULMONARY DISEASE

## 2023-11-20 ENCOUNTER — Other Ambulatory Visit: Payer: Self-pay

## 2023-11-20 ENCOUNTER — Inpatient Hospital Stay (HOSPITAL_COMMUNITY)

## 2023-11-20 ENCOUNTER — Telehealth (HOSPITAL_COMMUNITY): Payer: Self-pay | Admitting: Family

## 2023-11-20 DIAGNOSIS — M19042 Primary osteoarthritis, left hand: Secondary | ICD-10-CM

## 2023-11-20 DIAGNOSIS — G4734 Idiopathic sleep related nonobstructive alveolar hypoventilation: Secondary | ICD-10-CM

## 2023-11-20 DIAGNOSIS — R600 Localized edema: Secondary | ICD-10-CM

## 2023-11-20 DIAGNOSIS — R739 Hyperglycemia, unspecified: Principal | ICD-10-CM

## 2023-11-20 DIAGNOSIS — R0600 Dyspnea, unspecified: Secondary | ICD-10-CM

## 2023-11-20 DIAGNOSIS — J410 Simple chronic bronchitis: Secondary | ICD-10-CM

## 2023-11-20 DIAGNOSIS — I272 Pulmonary hypertension, unspecified: Secondary | ICD-10-CM

## 2023-11-20 DIAGNOSIS — E872 Acidosis, unspecified: Secondary | ICD-10-CM

## 2023-11-20 DIAGNOSIS — R918 Other nonspecific abnormal finding of lung field: Secondary | ICD-10-CM

## 2023-11-20 DIAGNOSIS — G4733 Obstructive sleep apnea (adult) (pediatric): Secondary | ICD-10-CM

## 2023-11-20 DIAGNOSIS — M19041 Primary osteoarthritis, right hand: Secondary | ICD-10-CM

## 2023-11-20 LAB — BASIC METABOLIC PANEL
ANION GAP: 9 mmol/L (ref 7–18)
BUN/CREA RATIO: 26
BUN: 15 mg/dL (ref 8–23)
CALCIUM: 9.1 mg/dL (ref 8.3–10.7)
CHLORIDE: 104 mmol/L (ref 96–106)
CO2 TOTAL: 29 mmol/L (ref 22–30)
CREATININE: 0.58 mg/dL (ref 0.50–0.90)
ESTIMATED GFR: >90 mL/min/1.73mˆ2 (ref >90)
GLUCOSE: 146 mg/dL — ABNORMAL HIGH (ref 74–109)
POTASSIUM: 4.1 mmol/L (ref 3.2–5.0)
SODIUM: 142 mmol/L (ref 133–144)

## 2023-11-20 LAB — PARACHUTE DME

## 2023-11-20 LAB — POC BLOOD GLUCOSE (RESULTS)
GLUCOSE, POC: 143 mg/dL — ABNORMAL HIGH (ref 70–100)
GLUCOSE, POC: 188 mg/dL — ABNORMAL HIGH (ref 70–100)
GLUCOSE, POC: 205 mg/dL — ABNORMAL HIGH (ref 70–100)
GLUCOSE, POC: 244 mg/dL — ABNORMAL HIGH (ref 70–100)

## 2023-11-20 LAB — RESPIRATORY CULTURE AND GRAM STAIN (PERFORMABLE): RESPIRATORY CULTURE: NORMAL

## 2023-11-20 MED ORDER — SPIRONOLACTONE 25 MG TABLET
50.0000 mg | ORAL_TABLET | Freq: Every morning | ORAL | Status: DC
Start: 2023-11-21 — End: 2023-11-21
  Administered 2023-11-21: 50 mg via ORAL
  Filled 2023-11-20: qty 2

## 2023-11-20 MED ORDER — SPIRONOLACTONE 25 MG TABLET
25.0000 mg | ORAL_TABLET | Freq: Every morning | ORAL | Status: DC
Start: 2023-11-20 — End: 2023-11-20
  Administered 2023-11-20: 25 mg via ORAL
  Filled 2023-11-20: qty 1

## 2023-11-20 MED ORDER — HYDRALAZINE 20 MG/ML INJECTION SOLUTION
10.0000 mg | Freq: Four times a day (QID) | INTRAMUSCULAR | Status: DC | PRN
Start: 2023-11-20 — End: 2023-11-21
  Administered 2023-11-20: 10 mg via INTRAVENOUS
  Filled 2023-11-20: qty 1

## 2023-11-20 NOTE — Telephone Encounter (Signed)
 [name] Larraine CROME, APRN, CNP      11/19/23 11:48 AM  Note  Need PET scan and 1-2 week f/u please         NPHFU/Procedure follow up needed

## 2023-11-20 NOTE — Progress Notes (Signed)
 [facility]  Progress Note    [name] [name], [name] [name], 68 y.o. female  Date of Birth:  08-Dec-1955  Encounter Start Date:  11/15/2023  Inpatient Admission Date: 11/15/2023  Date of service: 11/20/2023       Interim Update  Patient was seen and examined at bedside today. She reported exertional dyspnea. Noted to have desaturated on 6 min walk test. Also complained of bradycardia during exertion. Denied any cough or chest pain.    acetaminophen  (TYLENOL ) tablet, 650 mg, Oral, Q4H PRN  albuterol  (PROVENTIL ) 2.5mg / 0.5 mL nebulizer solution, 2.5 mg, Nebulization, 4x/day  budesonide  (PULMICORT  RESPULES) 0.5 mg/2 mL nebulizer suspension, 0.5 mg, Nebulization, 2x/day   And  arformoterol (BROVANA) 15 mcg/2 mL nebulizer solution, 15 mcg, Nebulization, 2x/day  aspirin  (ECOTRIN) enteric coated tablet 81 mg, 81 mg, Oral, Daily  benzonatate  (TESSALON ) capsule, 100 mg, Oral, Q8H PRN  [Held by provider] carvedilol (COREG) tablet, 3.125 mg, Oral, 2x/day-Food  cetirizine (zyrTEC) tablet, 10 mg, Oral, Daily PRN  Correction/SSIP insulin  lispro 100 units/mL injection, 4-18 Units, Subcutaneous, 4x/day AC  cyclobenzaprine  (FLEXERIL ) tablet, 10 mg, Oral, 3x/day PRN  NS 250 mL flush bag, , Intravenous, Q15 Min PRN   And  D5W 250 mL flush bag, , Intravenous, Q15 Min PRN  dextrose  (GLUTOSE) 40% oral gel, 15 g, Oral, Q15 Min PRN  dextrose  50% (0.5 g/mL) injection - syringe, 12.5 g, Intravenous, Q15 Min PRN  doxycycline  hyclate (VIBRAMYCIN ) capsule, 100 mg, Oral, 2x/day  enoxaparin  PF (LOVENOX ) 40 mg/0.4 mL SubQ injection, 40 mg, Subcutaneous, Q24H  fenofibrate (TRICOR) 145 mg tablet, 145 mg, Oral, Daily with Breakfast  fluticasone  (FLONASE ) 50 mcg per spray nasal spray, 2 Spray, Each Nostril, Daily  folic acid  (FOLVITE ) tablet, 1 mg, Oral, Daily  glucagon  (GLUCAGEN) injection 1 mg, 1 mg, IntraMUSCULAR, Once PRN  guaiFENesin  (MUCINEX ) extended release tablet - for cough (expectorant), 600 mg, Oral, 2x/day  hydroCHLOROthiazide  (HYDRODIURIL) tablet, 25 mg, Oral, Daily  insulin  glargine-yfgn 100 units/mL injection, 20 Units, Subcutaneous, 2x/day  lisinopril  (PRINIVIL ) tablet, 40 mg, Oral, Daily  NS flush syringe, 3 mL, Intracatheter, Q8HRS  NS flush syringe, 3 mL, Intracatheter, Q1H PRN  ondansetron  (ZOFRAN ) 2 mg/mL injection, 4 mg, Intravenous, Q6H PRN  pantoprazole  (PROTONIX ) delayed release tablet, 40 mg, Oral, Daily  spironolactone (ALDACTONE) tablet, 25 mg, Oral, Daily with Breakfast            Vital Signs:    Filed Vitals:    11/20/23 0733 11/20/23 1130 11/20/23 1351 11/20/23 1555   BP: (!) 190/81  (!) 159/66    Pulse: 63  62    Resp: 17  16    Temp: 36.1 C (97 F)      SpO2: 97% 96% 94% 95%      Body mass index is 33.66 kg/m.    Physical Exam:  Body mass index is 33.66 kg/m. ,  obese built,  afebrile,  not dyspneic,  on room air  Short neck  CVS:  S1 S2  regular, distant,   No murmurs, 1+ ankle leg edema.  Res. Sys:  Diminished breath sounds,  No Rales,  No Wheezing.   GI:  Abdomen, obese,  no distension,  no tenderness,  no masses  Mus. Skele. Sys:  1+ pitting pedal and ankle leg Edema,  no Calf tenderness  Skin:  no Cyanosis,  no icterus.  No Rash  CNS:  AA x O3,  no focal deficits,  no tremors  Psych:  Somewhat anxious.  No  hallucination    Studies:  I have reviewed all available studies within the electronic medical record.    Results for orders placed or performed during the hospital encounter of 11/15/23 (from the past 24 hours)   POC BLOOD GLUCOSE (RESULTS)   Result Value Ref Range    GLUCOSE, POC 269 (H) 70 - 100 mg/dl   BASIC METABOLIC PANEL   Result Value Ref Range    SODIUM 142 133 - 144 mmol/L    POTASSIUM 4.1 3.2 - 5.0 mmol/L    CHLORIDE 104 96 - 106 mmol/L    CO2 TOTAL 29 22 - 30 mmol/L    ANION GAP 9 7 - 18 mmol/L    CALCIUM 9.1 8.3 - 10.7 mg/dL    GLUCOSE 853 (H) 74 - 109 mg/dL    BUN 15 8 - 23 mg/dL    CREATININE 9.41 9.49 - 0.90 mg/dL    BUN/CREA RATIO 26     ESTIMATED GFR >90 >90 mL/min/1.7m^2   POC BLOOD  GLUCOSE (RESULTS)   Result Value Ref Range    GLUCOSE, POC 143 (H) 70 - 100 mg/dl   POC BLOOD GLUCOSE (RESULTS)   Result Value Ref Range    GLUCOSE, POC 188 (H) 70 - 100 mg/dl   [facility]   Result Value Ref Range    Supplier Name [employer]     Supplier Phone [phone]     Delivery Status Processing     Delivery Note      Requested Delivery Date 11/20/2023     Item Description Rollator Not Specific Color     Item Description Seat Attachment      No results found. .    Assessment and Plan:  Assessment & Plan  Dyspnea  Dependent edema  Exertional shortness of breath likely multifactorial.  Troponin T within normal limit including BNP.    Echo revealed LVEF 60-65%.  Moderate LVH.  no significant valvular heart disease.    CTA angio chest no PE  Dependent edema could be due to hypertensive heart disease possibly secondary to Norvasc .  Evaluated by cardiologist for elevated RVSP.    However Echocardiogram showed estimated RVSP to be at upper limits of normal range with normal left ventricular systolic function.   Cardiology Coreg added instead of metoprolol  for better blood pressure control  Norvasc  discontinued due to ankle edema  Due to bradycardia Coreg has to be held  Increased dose of HCTZ and added aldactone  Acute hypoxic respiratory failure (CMS HCC)  Now needing O2 via nasal cannula.  Oxygen has been weaned off  PFT studies report no significant obstruction restrictive pattern  Rheumatologist consulted.   Latest Reference Range & Units 11/16/23 10:41   ANTI-DNA AB Negative  Negative   CYCLIC CITRULLINATED PEPTIDE ANTIBODY IGG  <3.0 U/mL <0.5   CYCLIC CITRULLINATED PEPTIDE ANTIBODY IGG QUAL Negative  Negative   ANA SCREEN WITH REFLEX  Rpt   ANA SCREEN Negative  Negative   RNP ANTIBODIES, IGG, QUALITATIVE Negative  Negative   RNP ANTIBODIES, IGG, QUANTITATIVE <1.0 AAU/mL <0.2   SS-A/RO ANTIBODIES, IGG, QUALITATIVE Negative  Negative   SS-B/LA ANTIBODIES, IGG, QUALITATIVE Negative  Negative       Latest Reference Range & Units 11/16/23 10:41   RHEUMATOID FACTOR 0 - 14 IU/mL 181 (H)   RHEUMATOID FACTOR, SERUM  Rpt !   Autoimmune disease workup in progress  ACE and ANCA are pending   Ordered anti-CCP level.  Since patient claims she  has rheumatoid arthritis however never followed up with a rheumatologist did not know how diagnosis was made.  Uncomplicated asthma, unspecified asthma severity, unspecified whether persistent  Chronic bronchitis  Had mild leukocytosis without obvious lung exam suggesting wheezing when patient is seen  ESR 41.  D-dimer 0.2.  Procalcitonin 0.06.  CRP 1.3.  ANA screening pending.  Continue DuoNeb PRN  Long-acting beta agonist and inhaled steroids.  Doxycycline  b.i.d.  Discontinued steroids  Lung nodules  Right upper lobe 8 mm pulmonary nodule Pulmonologist evaluated.  check interstitial lung disease panel as well  She will need PET scan in a few weeks to reassess lung nodularity and follow up .  Snoring  Outpatient sleep studies.  Probably has untreated sleep apnea  Obtain overnight pulse oximetry, 6 minute test  Diabetes mellitus type 2, insulin  dependent (CMS HCC)  A1c 9.7.  Uncontrolled with hyperglycemia.  Increase doses of Lantus  and sliding scale ADA diet  Hypertensive heart disease without heart failure  Primary hypertension  With echo evidence of LVH  Continue , hydrochlorothiazide and lisinopril   Patient was seen by cardiologist Dr. Jodean. no further inpatient workup required  Added Aldactone  Gastroesophageal reflux disease without esophagitis  Continue PPI       Khalon Cansler R Dougles Kimmey, MD

## 2023-11-20 NOTE — Nurses Notes (Signed)
 HydrALAZINE  10 mg given at 2134.  BP 185/69.  Physician aware.

## 2023-11-20 NOTE — Telephone Encounter (Signed)
 New encounter created for the PET scan

## 2023-11-20 NOTE — Telephone Encounter (Signed)
 Please let reception know once this has been approved and schedule so they can make her a 1 week HFU/Procedure follow up one week later

## 2023-11-20 NOTE — Assessment & Plan Note (Signed)
 Exertional shortness of breath likely multifactorial.  Troponin T within normal limit including BNP.    Echo revealed LVEF 60-65%.  Moderate LVH.  no significant valvular heart disease.    CTA angio chest no PE  Dependent edema could be due to hypertensive heart disease possibly secondary to Norvasc .  Evaluated by cardiologist for elevated RVSP.    However Echocardiogram showed estimated RVSP to be at upper limits of normal range with normal left ventricular systolic function.   Cardiology Coreg added instead of metoprolol  for better blood pressure control  Norvasc  discontinued due to ankle edema  Due to bradycardia Coreg has to be held  Increased dose of HCTZ and added aldactone

## 2023-11-20 NOTE — Care Plan (Signed)
 Problem: Adult Inpatient Plan of Care  Goal: Absence of Hospital-Acquired Illness or Injury  Intervention: Identify and Manage Fall Risk  Recent Flowsheet Documentation  Taken 11/20/2023 1000 by Lela HERO, GN  Safety Promotion/Fall Prevention:   fall prevention program maintained   muscle strengthening facilitated   nonskid shoes/slippers when out of bed   safety round/check completed     Problem: Adult Inpatient Plan of Care  Goal: Absence of Hospital-Acquired Illness or Injury  Intervention: Prevent Skin Injury  Recent Flowsheet Documentation  Taken 11/20/2023 1000 by Lela HERO, GN  Body Position: sitting     Problem: Adult Inpatient Plan of Care  Goal: Absence of Hospital-Acquired Illness or Injury  Intervention: Identify and Manage Fall Risk  Recent Flowsheet Documentation  Taken 11/20/2023 1000 by Lela HERO, GN  Safety Promotion/Fall Prevention:   fall prevention program maintained   muscle strengthening facilitated   nonskid shoes/slippers when out of bed   safety round/check completed     Problem: Adult Inpatient Plan of Care  Goal: Absence of Hospital-Acquired Illness or Injury  Intervention: Prevent Skin Injury  Recent Flowsheet Documentation  Taken 11/20/2023 1000 by Lela HERO, GN  Body Position: sitting

## 2023-11-20 NOTE — Telephone Encounter (Signed)
 PET scheduled 11-29-23, needs one week fu and notified.  Thanks

## 2023-11-20 NOTE — Assessment & Plan Note (Signed)
 Outpatient sleep studies.  Probably has untreated sleep apnea  Obtain overnight pulse oximetry, 6 minute test

## 2023-11-20 NOTE — Telephone Encounter (Signed)
 In addition to the PFT's and PET previously requested, she'll need a sleep study as well. Ideally in lab if insurance will approve, home study if not.

## 2023-11-20 NOTE — Assessment & Plan Note (Signed)
 With echo evidence of LVH  Continue , hydrochlorothiazide and lisinopril   Patient was seen by cardiologist Dr. Jodean. no further inpatient workup required  Added Aldactone

## 2023-11-20 NOTE — Care Management Notes (Signed)
 Pt signed IM letter

## 2023-11-20 NOTE — Assessment & Plan Note (Signed)
 Had mild leukocytosis without obvious lung exam suggesting wheezing when patient is seen  ESR 41.  D-dimer 0.2.  Procalcitonin 0.06.  CRP 1.3.  ANA screening pending.  Continue DuoNeb PRN  Long-acting beta agonist and inhaled steroids.  Doxycycline  b.i.d.  Discontinued steroids

## 2023-11-20 NOTE — Assessment & Plan Note (Signed)
 Right upper lobe 8 mm pulmonary nodule Pulmonologist evaluated.  check interstitial lung disease panel as well  She will need PET scan in a few weeks to reassess lung nodularity and follow up .

## 2023-11-20 NOTE — Assessment & Plan Note (Signed)
 Continue PPI.

## 2023-11-20 NOTE — Care Management Notes (Signed)
 11/20/23 1330   Assessment Details   Assessment Type Admission   Date of Care Management Update 11/20/23   Date of Next DCP Update 11/22/23   Readmission   Is this a readmission? No   Insurance Information/Type   Insurance type Medicare   Employment/Financial   Patient has Prescription Coverage?  Yes        Name of Insurance Coverage for Medications [employer]   Financial/Environmental Concerns none   Living Environment   Select an age group to open lives with row.  Adult   Lives With child(ren), adult   Living Arrangements house   Able to Return to Prior Arrangements yes   Home Safety   Home Assessment: No Problems Identified   Home Accessibility no concerns   Care Management Plan   Discharge Planning Status initial meeting   Projected Discharge Date 11/21/23   Discharge plan discussed with: Patient;Other (Comment)  (Granddaughter)   CM will evaluate for rehabilitation potential yes   Patient choice offered to patient/family Yes   Discharge Needs Assessment   Equipment Currently Used at Home cane, straight   Equipment Needed After Discharge other (see comments)  (rollator)   Discharge Facility/Level of Care Needs Home (Patient/Family Member/other)(code 1)   Transportation Available family or friend will provide;car   Referral Information   Admission Type inpatient   Arrived From home or self-care   ADVANCE DIRECTIVES   Does the Patient have an Advance Directive? No, Information Offered and Refused       CM met with patient and granddaughter at bedside and completed assessment.  Patient to return home with family at discharge.  Per nursing staff, walk test completed and patient does not qualify for home O2.  Patient requesting rollator, order sent to [employer].

## 2023-11-20 NOTE — Assessment & Plan Note (Signed)
 A1c 9.7.  Uncontrolled with hyperglycemia.  Increase doses of Lantus  and sliding scale ADA diet

## 2023-11-20 NOTE — Progress Notes (Signed)
  [facility]  Progress Note    [name] [name]  Date of service: 11/20/2023   Date of Admission:  11/15/2023    Hospital Day:  LOS: 5 days     Chief complaint: improved breathing today. Concerned about bradycardia    Subjective:   [name] [name] is a 68 y.o., White female with past medical history of asthma hypertension who presents with leg swelling and shortness of breath.  She has shortness of breath with exertion, intermittent, improved with rest, associated for leg swelling, not associated with a wheeze or chest pain.  She has a history of asthma on PRN inhaler at home.  She is nonsmoker.  She denies respiratory exposures.  She has a history of several CTs over the past year which have shown right upper lobe nodularity up to 6 mm which now shows about 8 mm.  She has had some ground-glass opacity on the left side which has been stable.      10/10: room air. Still with dyspnea with minimal exertion. C/o swollen and palpitations.      10/11: room air. C/o coughed all night. States can't get sputum up.     10/12: room air. Has mobilized some mucus since yesterday. Breathing is better. HR dropped to 30's while asleep today    10/13- on RA. Up to side of bed. Granddaughter at bedside. No new complaints. She does still admit to OSA symptoms.       ROS  Gen: No fever, aches, chills  HEENT: No headache ,nasal congestion, sore throat  Lungs: +intermittent SOB, + cough  Heart: No chest pain, palpitations, syncope  Skin: No rashes, lesions, pruritus       Vital Signs:  Temp  Avg: 36.1 C (97 F)  Min: 36.1 C (97 F)  Max: 36.2 C (97.1 F)    Pulse  Avg: 61  Min: 57  Max: 65 BP  Min: 137/53  Max: 190/81   Resp  Avg: 17.5  Min: 17  Max: 18 SpO2  Avg: 95.6 %  Min: 93 %  Max: 98 %          Input/Output    Intake/Output Summary (Last 24 hours) at 11/20/2023 1332  Last data filed at 11/19/2023 1800  Gross per 24 hour   Intake 240 ml   Output --   Net 240 ml    I/O last shift:  No intake/output data recorded.    acetaminophen  (TYLENOL ) tablet, 650 mg, Oral, Q4H PRN  albuterol  (PROVENTIL ) 2.5mg / 0.5 mL nebulizer solution, 2.5 mg, Nebulization, 4x/day  budesonide  (PULMICORT  RESPULES) 0.5 mg/2 mL nebulizer suspension, 0.5 mg, Nebulization, 2x/day   And  arformoterol (BROVANA) 15 mcg/2 mL nebulizer solution, 15 mcg, Nebulization, 2x/day  aspirin  (ECOTRIN) enteric coated tablet 81 mg, 81 mg, Oral, Daily  benzonatate  (TESSALON ) capsule, 100 mg, Oral, Q8H PRN  [Held by provider] carvedilol (COREG) tablet, 3.125 mg, Oral, 2x/day-Food  cetirizine (zyrTEC) tablet, 10 mg, Oral, Daily PRN  Correction/SSIP insulin  lispro 100 units/mL injection, 4-18 Units, Subcutaneous, 4x/day AC  cyclobenzaprine  (FLEXERIL ) tablet, 10 mg, Oral, 3x/day PRN  NS 250 mL flush bag, , Intravenous, Q15 Min PRN   And  D5W 250 mL flush bag, , Intravenous, Q15 Min PRN  dextrose  (GLUTOSE) 40% oral gel, 15 g, Oral, Q15 Min PRN  dextrose  50% (0.5 g/mL) injection - syringe, 12.5 g, Intravenous, Q15 Min PRN  doxycycline  hyclate (VIBRAMYCIN ) capsule, 100 mg, Oral, 2x/day  enoxaparin  PF (LOVENOX ) 40 mg/0.4 mL SubQ  injection, 40 mg, Subcutaneous, Q24H  fenofibrate (TRICOR) 145 mg tablet, 145 mg, Oral, Daily with Breakfast  fluticasone  (FLONASE ) 50 mcg per spray nasal spray, 2 Spray, Each Nostril, Daily  folic acid  (FOLVITE ) tablet, 1 mg, Oral, Daily  glucagon  (GLUCAGEN) injection 1 mg, 1 mg, IntraMUSCULAR, Once PRN  guaiFENesin  (MUCINEX ) extended release tablet - for cough (expectorant), 600 mg, Oral, 2x/day  hydroCHLOROthiazide (HYDRODIURIL) tablet, 25 mg, Oral, Daily  insulin  glargine-yfgn 100 units/mL injection, 20 Units, Subcutaneous, 2x/day  lisinopril  (PRINIVIL ) tablet, 40 mg, Oral, Daily  NS flush syringe, 3 mL, Intracatheter, Q8HRS  NS flush syringe, 3 mL, Intracatheter, Q1H PRN  ondansetron  (ZOFRAN ) 2 mg/mL injection, 4 mg, Intravenous, Q6H PRN  pantoprazole  (PROTONIX ) delayed release tablet, 40 mg, Oral, Daily  spironolactone (ALDACTONE) tablet, 25 mg, Oral,  Daily with Breakfast        Physical Exam:  Constitutional: awake, alert, appears stated age. No acute distress noted.    Eyes: Pupils equal and round, reactive to light and accomodation. , Sclera non-icteric. , normal findings: lids and lashes normal and conjunctivae and sclerae normal  ENT: Head atraumatic and normocephalic, External Ears: normal pinnae shape and position and no signs of inflammation, Nose without erythema. , Mouth mucous membranes moist. , No oral lesions.   Neck: no thyromegaly or lymphadenopathy, supple, symmetrical, trachea midline, and no adenopathy.   Respiratory:   Clear.   Cardiovascular: regular rate and rhythm, S1, S2 normal.    Gastrointestinal: non-distended, Soft, non-tender, Bowel sounds normal, No hepatosplenomegaly, No masses.    Musculoskeletal:  moves all 4 extremities , no edema noted.   Integumentary:  Skin warm and dry and Skin color, texture, turgor normal. No rashes or lesions  Neurologic: Grossly normal.  Following commands, CN II - XII grossly intact , No tremor  Lymphatic/Immunologic/Hematologic: No lymphadenopathy  Psychiatric:  Pleasant affect.  Insight and judgment seem intact.       Labs:     Results for orders placed or performed during the hospital encounter of 11/15/23 (from the past 24 hours)   POC BLOOD GLUCOSE (RESULTS)   Result Value Ref Range    GLUCOSE, POC 198 (H) 70 - 100 mg/dl   POC BLOOD GLUCOSE (RESULTS)   Result Value Ref Range    GLUCOSE, POC 269 (H) 70 - 100 mg/dl   BASIC METABOLIC PANEL   Result Value Ref Range    SODIUM 142 133 - 144 mmol/L    POTASSIUM 4.1 3.2 - 5.0 mmol/L    CHLORIDE 104 96 - 106 mmol/L    CO2 TOTAL 29 22 - 30 mmol/L    ANION GAP 9 7 - 18 mmol/L    CALCIUM 9.1 8.3 - 10.7 mg/dL    GLUCOSE 853 (H) 74 - 109 mg/dL    BUN 15 8 - 23 mg/dL    CREATININE 9.41 9.49 - 0.90 mg/dL    BUN/CREA RATIO 26     ESTIMATED GFR >90 >90 mL/min/1.16m^2   POC BLOOD GLUCOSE (RESULTS)   Result Value Ref Range    GLUCOSE, POC 143 (H) 70 - 100 mg/dl   POC  BLOOD GLUCOSE (RESULTS)   Result Value Ref Range    GLUCOSE, POC 188 (H) 70 - 100 mg/dl      Radiology:      Results for orders placed or performed during the hospital encounter of 11/15/23 (from the past 2160 hours)   XR CHEST PA AND LATERAL     Status: None    Narrative  Ambermarie S Vetrano    XR CHEST PA AND LATERAL 11/15/2023 8:54 PM    CLINICAL HISTORY: dyspnea  Dyspnea    TECHNIQUE:  2 views of the chest were submitted on 2 images.    COMPARISON: November 09, 2023      FINDINGS:       Heart size normal.  No infiltrate, effusion or edema.  No pneumothorax.  No evidence of interstitial disease.  No focal or acute osseous abnormality.        Impression    No radiographic evidence of acute cardiopulmonary process.            Radiologist location ID: [id number]     CT ANGIO CHEST FOR PULMONARY EMBOLUS W IV CONTRAST     Status: None    Narrative    Quenna S Goodwyn    CT ANGIO CHEST FOR PULMONARY EMBOLUS W IV CONTRAST performed on 11/16/2023 2:30 AM.    INDICATION:  Shortness a breath   Additional History:  dyspnea    TECHNIQUE:  CT angiography of the chest performed for evaluation of the pulmonary arteries, with axial, sagittal, and coronal multiplanar reformations and a coronal 3D MIP.  Dose modulation, automated exposure control, and/or iterative reconstruction were used for dose reduction.    CONTRAST: 100 mL of Isovue 370 IV    COMPARISON:  CT 11/09/2023  ___________________________________  FINDINGS:    HEART / GREAT VESSELS: There are no filling defects in the pulmonary arteries to suggest a pulmonary embolism.  The heart is normal in size without pericardial effusion.  Atherosclerotic calcification of the thoracic aorta without aneurysmal dilation. Mild atherosclerotic calcification of the aortic annulus.SABRA     LUNGS / AIRWAYS: Solid nodule in the right upper lobe adjacent to the fissure measuring approximately 8 x 6 mm similar to 11/09/2023 CT. Adjacent 3 mm. perifissural nodule (series 6, image 23) which may  represent lymph node. Elongated left upper lobe groundglass opacity similar to 11/09/2023 CT. Negative for pleural effusion or pneumothorax.  The central airways are patent.    MEDIASTINUM: There is no hilar or mediastinal lymphadenopathy.    UPPER ABDOMEN: Negative for acute abnormality.     BONES:  Degenerative changes of the thoracic spine.. Negative for acute abnormality    OTHER:  The visualized thyroid gland is unremarkable.  ___________________________________    Impression    1. Negative for acute pulmonary embolus.  2. Solid pulmonary nodule in the right upper lobe measuring 8 mm. Findings may be further evaluated with PET CT or follow-up CT chest.  3. Similar appearance of elongated mixed solid and groundglass opacity in the left upper lobe which may also be further evaluated with PET CT.      Radiologist location ID: [id number]        Consults:   Assessment/ Plan:   Active Hospital Problems    Diagnosis    Primary Problem: Dyspnea    Acute hypoxic respiratory failure (CMS HCC)    Chronic bronchitis    Snoring    Diabetes mellitus type 2, insulin  dependent (CMS HCC)    Hypertensive heart disease without heart failure    Gastroesophageal reflux disease without esophagitis    Primary hypertension    Dependent edema    Uncomplicated asthma, unspecified asthma severity, unspecified whether persistent    Lung nodules     PLAN  Patient seen and examined  Chart/labs/meds personally reviewed  CT chest shows 8mm RUL nodule  68 y/o presents with SOB and swelling  PFT shows small airway disease but no overt restriction/obstruction  ILD lab w/u showing RF of 181. ACE and ANCA are pending. Other labs pending.  Rheum eval and are planning OP f/u.   Echo notes that her RVSP was elevated and moderate LVH  Appreciate cards input. Have adjusted BP meds  Continues to c/o bradycardia and states her HR dropped to 30's while asleep today  ONPO shows nearly of desats, however, has no hypercapnia, she will need an OP PSG to  qualify for CPAP.  She does have strong symptoms of OSA.  Pt feels she can move mucus better today  Continue acapella and mucinex   Day 5 empiric doxy- sputum culture normal flora.  will also need PET in few weeks for RUL nodule f/u  Have eRx Symbicort  to pharmacy to have at d/c, as well as albuterol  nebs  Stable for d/c from pulm standpoint. Have sent f/u in to our office to have PET and short term f/u visit.    The patient was seen with Dr. Manya who will provide further recommendations to the addendum.    Jolan Jenkins Gull, APRN, [facility]  11/20/2023 13:36      Pulmonary attending:  Patient seen and examined personally by me  Pertinent labs/images/reports were reviewed personally by me  Chart records and history reviewed  Assessment and plan of care reviewed in detail  I performed majority of Medical Decision Making of this visit on this date of service acounting for greater than 50% of the medical visit.  Patient has acute and  chronic illness requiring review of testing, ordering of follow-up testing, and interpretation of test results.      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

## 2023-11-20 NOTE — Assessment & Plan Note (Signed)
 Now needing O2 via nasal cannula.  Oxygen has been weaned off  PFT studies report no significant obstruction restrictive pattern  Rheumatologist consulted.   Latest Reference Range & Units 11/16/23 10:41   ANTI-DNA AB Negative  Negative   CYCLIC CITRULLINATED PEPTIDE ANTIBODY IGG  <3.0 U/mL <0.5   CYCLIC CITRULLINATED PEPTIDE ANTIBODY IGG QUAL Negative  Negative   ANA SCREEN WITH REFLEX  Rpt   ANA SCREEN Negative  Negative   RNP ANTIBODIES, IGG, QUALITATIVE Negative  Negative   RNP ANTIBODIES, IGG, QUANTITATIVE <1.0 AAU/mL <0.2   SS-A/RO ANTIBODIES, IGG, QUALITATIVE Negative  Negative   SS-B/LA ANTIBODIES, IGG, QUALITATIVE Negative  Negative      Latest Reference Range & Units 11/16/23 10:41   RHEUMATOID FACTOR 0 - 14 IU/mL 181 (H)   RHEUMATOID FACTOR, SERUM  Rpt !   Autoimmune disease workup in progress  ACE and ANCA are pending   Ordered anti-CCP level.  Since patient claims she has rheumatoid arthritis however never followed up with a rheumatologist did not know how diagnosis was made.

## 2023-11-20 NOTE — Care Plan (Signed)
 Problem: Adult Inpatient Plan of Care  Goal: Plan of Care Review  Outcome: Ongoing (see interventions/notes)  Goal: Patient-Specific Goal (Individualized)  Outcome: Ongoing (see interventions/notes)  Flowsheets (Taken 11/19/2023 2100)  Individualized Care Needs: decrease SOB on exertion, increase mobility  Anxieties, Fears or Concerns: none at this time  Goal: Absence of Hospital-Acquired Illness or Injury  Outcome: Ongoing (see interventions/notes)  Goal: Optimal Comfort and Wellbeing  Outcome: Ongoing (see interventions/notes)  Goal: Rounds/Family Conference  Outcome: Ongoing (see interventions/notes)     Problem: Adult Inpatient Plan of Care  Goal: Plan of Care Review  Outcome: Ongoing (see interventions/notes)  Goal: Patient-Specific Goal (Individualized)  Outcome: Ongoing (see interventions/notes)  Flowsheets (Taken 11/19/2023 2100)  Individualized Care Needs: decrease SOB on exertion, increase mobility  Anxieties, Fears or Concerns: none at this time  Goal: Absence of Hospital-Acquired Illness or Injury  Outcome: Ongoing (see interventions/notes)  Goal: Optimal Comfort and Wellbeing  Outcome: Ongoing (see interventions/notes)  Goal: Rounds/Family Conference  Outcome: Ongoing (see interventions/notes)     Problem: Fall Injury Risk  Goal: Absence of Fall and Fall-Related Injury  Outcome: Ongoing (see interventions/notes)     Problem: Gas Exchange Impaired  Goal: Optimal Gas Exchange  Outcome: Ongoing (see interventions/notes)     Problem: VTE (Venous Thromboembolism)  Goal: Tissue Perfusion  Outcome: Ongoing (see interventions/notes)  Goal: Optimal Right Ventricular Function  Outcome: Ongoing (see interventions/notes)

## 2023-11-20 NOTE — Telephone Encounter (Signed)
 11-29-23 and I sent a separate message to reception to get scheduled.

## 2023-11-20 NOTE — Consults (Signed)
 Rheumatology Consultation  @ED       PATIENT NAME:  [name] [name]  DATE OF BIRTH:  02-12-1955  MRN:  [id number]  DATE OF SERVICE:  11/15/2023    Presenting history:  [name] [name] is a 68 y.o. 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  Being evaluated by pulmonology and Internal Medicine   Rheumatology consulted to evaluate for potential association of rheumatoid factor and lung findings on imaging.    Pertinent information:    Rheumatoid factor 181   Negative or normal CCP, RNP, SSA, SSB  PFT shows small airway disease but no overt restriction/obstruction, normal DLCO  CT chest shows solid pulmonary nodules right upper lobe 8 mm, solid and ground-glass opacities in the left upper lobe which may need further PET scan and evaluation.     Assessment:   Incidental finding of lung nodule and ground-glass opacities on imaging studies, based on pulmonary evaluation needs PET scan to rule out potential malignancy.       Plan:      - No indication for steroids  - We will evaluate further on outpatient basis  -  We will get x-ray of the hands to evaluate for potential erosive and establish a baseline      VITALS SIGNS:  Blood pressure (!) 190/81, pulse 63, temperature 36.1 C (97 F), resp. rate 17, height 1.6 m (5' 3), weight 86.2 kg (190 lb), SpO2 96%.    REVIEW OF SYSTEMS:  Review of system negative except for the pertinent positives mentioned in HPI    E consult was conducted for this patient    Koleen Raveling, MD  Assistant professor of Medicine  [place]  [facility]    This note was partially created using MModal Fluency Direct system (voice recognition software) and is inherently subject to errors including those of syntax and sound-alike substitutions which may escape proofreading. In such instances, original meaning may be  extrapolated by contextual derivation

## 2023-11-20 NOTE — Nurses Notes (Signed)
 Completed 6 minute walk test with patient. Dr. Francella and Scot with cardiology were updated with results. Patients granddaughter at bedside and patient lying in bed watching her phone. No distress at this time. Call light within reach, bed in lowest position. Care ongoing.

## 2023-11-21 ENCOUNTER — Inpatient Hospital Stay (HOSPITAL_COMMUNITY)

## 2023-11-21 ENCOUNTER — Other Ambulatory Visit: Payer: Self-pay

## 2023-11-21 LAB — CYCLIC CITRULLINATED PEPTIDE ANTIBODIES, IGG, SERUM
CYCLIC CITRULLINATED PEPTIDE ANTIBODY IGG QUAL: NEGATIVE
CYCLIC CITRULLINATED PEPTIDE ANTIBODY IGG QUANT: <0.5 U/mL (ref <3.0)

## 2023-11-21 LAB — PROTEINASE 3 ANTIBODIES, IGG, SERUM
PROTEINASE ANTIBODIES IGG, QUALITATIVE: NEGATIVE
PROTEINASE ANTIBODIES IGG, QUANTITATIVE: <0.2 [AU]/ml (ref <1.0)

## 2023-11-21 LAB — ADULT ROUTINE BLOOD CULTURE, SET OF 2 BOTTLES (BACTERIA AND YEAST): BLOOD CULTURE, ROUTINE: NO GROWTH

## 2023-11-21 LAB — MYELOPEROXIDASE ANTIBODIES, IGG, SERUM
MYELOPEROXIDASE ANTIBODIES IGG QUALITATIVE: NEGATIVE
MYELOPEROXIDASE ANTIBODIES IGG QUANTITATIVE: <0.2 [AU]/ml (ref <1.0)

## 2023-11-21 LAB — POC BLOOD GLUCOSE (RESULTS): GLUCOSE, POC: 234 mg/dL — ABNORMAL HIGH (ref 70–100)

## 2023-11-21 MED ORDER — BENAZEPRIL 40 MG TABLET
40.0000 mg | ORAL_TABLET | Freq: Every day | ORAL | 4 refills | Status: DC
  Filled 2023-11-21: qty 90, 90d supply, fill #0

## 2023-11-21 MED ORDER — LANTUS SOLOSTAR U-100 INSULIN 100 UNIT/ML (3 ML) SUBCUTANEOUS PEN
20.0000 [IU] | PEN_INJECTOR | Freq: Two times a day (BID) | SUBCUTANEOUS | 2 refills | Status: DC
  Filled 2023-11-21: qty 15, 37d supply, fill #0

## 2023-11-21 MED ORDER — HYDROCHLOROTHIAZIDE 25 MG TABLET
25.0000 mg | ORAL_TABLET | Freq: Every day | ORAL | 0 refills | Status: AC
  Filled 2023-11-21: qty 30, 30d supply, fill #0

## 2023-11-21 MED ORDER — SPIRONOLACTONE 50 MG TABLET
50.0000 mg | ORAL_TABLET | Freq: Every morning | ORAL | 0 refills | Status: AC
  Filled 2023-11-21: qty 30, 30d supply, fill #0

## 2023-11-21 NOTE — Care Plan (Signed)
 Problem: Adult Inpatient Plan of Care  Goal: Plan of Care Review  Outcome: Ongoing (see interventions/notes)  Goal: Patient-Specific Goal (Individualized)  Outcome: Ongoing (see interventions/notes)  Flowsheets (Taken 11/20/2023 1900)  Individualized Care Needs: decrease SOB, increase mobility  Anxieties, Fears or Concerns: none at this time  Goal: Absence of Hospital-Acquired Illness or Injury  Outcome: Ongoing (see interventions/notes)  Goal: Optimal Comfort and Wellbeing  Outcome: Ongoing (see interventions/notes)  Goal: Rounds/Family Conference  Outcome: Ongoing (see interventions/notes)     Problem: Adult Inpatient Plan of Care  Goal: Plan of Care Review  Outcome: Ongoing (see interventions/notes)  Goal: Patient-Specific Goal (Individualized)  Outcome: Ongoing (see interventions/notes)  Flowsheets (Taken 11/20/2023 1900)  Individualized Care Needs: decrease SOB, increase mobility  Anxieties, Fears or Concerns: none at this time  Goal: Absence of Hospital-Acquired Illness or Injury  Outcome: Ongoing (see interventions/notes)  Goal: Optimal Comfort and Wellbeing  Outcome: Ongoing (see interventions/notes)  Goal: Rounds/Family Conference  Outcome: Ongoing (see interventions/notes)     Problem: Fall Injury Risk  Goal: Absence of Fall and Fall-Related Injury  Outcome: Ongoing (see interventions/notes)     Problem: Gas Exchange Impaired  Goal: Optimal Gas Exchange  Outcome: Ongoing (see interventions/notes)     Problem: VTE (Venous Thromboembolism)  Goal: Tissue Perfusion  Outcome: Ongoing (see interventions/notes)  Goal: Optimal Right Ventricular Function  Outcome: Ongoing (see interventions/notes)

## 2023-11-21 NOTE — Nurses Notes (Signed)

## 2023-11-21 NOTE — Assessment & Plan Note (Signed)
 Continue PPI.

## 2023-11-21 NOTE — Assessment & Plan Note (Signed)
 Right upper lobe 8 mm pulmonary nodule Pulmonologist evaluated.  check interstitial lung disease panel as well  She will need PET scan in a few weeks to reassess lung nodularity and follow up .

## 2023-11-21 NOTE — Assessment & Plan Note (Signed)
 Now needing O2 via nasal cannula.  Oxygen has been weaned off  PFT studies report no significant obstruction restrictive pattern  Rheumatologist consulted.   Latest Reference Range & Units 11/16/23 10:41   ANTI-DNA AB Negative  Negative   CYCLIC CITRULLINATED PEPTIDE ANTIBODY IGG  <3.0 U/mL <0.5   CYCLIC CITRULLINATED PEPTIDE ANTIBODY IGG QUAL Negative  Negative   ANA SCREEN WITH REFLEX  Rpt   ANA SCREEN Negative  Negative   RNP ANTIBODIES, IGG, QUALITATIVE Negative  Negative   RNP ANTIBODIES, IGG, QUANTITATIVE <1.0 AAU/mL <0.2   SS-A/RO ANTIBODIES, IGG, QUALITATIVE Negative  Negative   SS-B/LA ANTIBODIES, IGG, QUALITATIVE Negative  Negative      Latest Reference Range & Units 11/16/23 10:41   RHEUMATOID FACTOR 0 - 14 IU/mL 181 (H)   RHEUMATOID FACTOR, SERUM  Rpt !   Autoimmune disease workup in progress  ACE and ANCA are pending   Ordered anti-CCP level.  Since patient claims she has rheumatoid arthritis however never followed up with a rheumatologist did not know how diagnosis was made.

## 2023-11-21 NOTE — Assessment & Plan Note (Signed)
 Exertional shortness of breath likely multifactorial.  Troponin T within normal limit including BNP.    Echo revealed LVEF 60-65%.  Moderate LVH.  no significant valvular heart disease.    CTA angio chest no PE  Dependent edema could be due to hypertensive heart disease possibly secondary to Norvasc .  Evaluated by cardiologist for elevated RVSP.    However Echocardiogram showed estimated RVSP to be at upper limits of normal range with normal left ventricular systolic function.   Cardiology Coreg added instead of metoprolol  for better blood pressure control  Norvasc  discontinued due to ankle edema  Due to bradycardia Coreg has to be held  Increased dose of HCTZ and added aldactone

## 2023-11-21 NOTE — Progress Notes (Signed)
 [facility]  Progress Note    [name]  Date of service: 11/21/2023   Date of Admission:  11/15/2023    Hospital Day:  LOS: 6 days     Chief complaint: improved SOB    Subjective:   [name] [name] is a 68 y.o., White female with past medical history of asthma hypertension who presents with leg swelling and shortness of breath.  She has shortness of breath with exertion, intermittent, improved with rest, associated for leg swelling, not associated with a wheeze or chest pain.  She has a history of asthma on PRN inhaler at home.  She is nonsmoker.  She denies respiratory exposures.  She has a history of several CTs over the past year which have shown right upper lobe nodularity up to 6 mm which now shows about 8 mm.  She has had some ground-glass opacity on the left side which has been stable.      10/10: room air. Still with dyspnea with minimal exertion. C/o swollen and palpitations.      10/11: room air. C/o coughed all night. States can't get sputum up.      10/12: room air. Has mobilized some mucus since yesterday. Breathing is better. HR dropped to 30's while asleep today     10/13- on RA. Up to side of bed. Granddaughter at bedside. No new complaints. She does still admit to OSA symptoms.    10/14: room air. Going home today.        ROS  Gen: No fever, aches, chills  HEENT: No headache ,nasal congestion, sore throat  Lungs: +intermittent SOB, + cough  Heart: No chest pain, palpitations, syncope  Skin: No rashes, lesions, pruritus    Vital Signs:  Temp  Avg: 36.7 C (98 F)  Min: 36.5 C (97.7 F)  Max: 36.8 C (98.2 F)    Pulse  Avg: 64.3  Min: 58  Max: 73 BP  Min: 158/47  Max: 185/69   Resp  Avg: 17.3  Min: 16  Max: 19 SpO2  Avg: 95.8 %  Min: 94 %  Max: 98 %          Input/Output  No intake or output data in the 24 hours ending 11/21/23 1311 I/O last shift:  No intake/output data recorded.   acetaminophen  (TYLENOL ) tablet, 650 mg, Oral, Q4H PRN  albuterol  (PROVENTIL ) 2.5mg / 0.5 mL  nebulizer solution, 2.5 mg, Nebulization, 4x/day  budesonide  (PULMICORT  RESPULES) 0.5 mg/2 mL nebulizer suspension, 0.5 mg, Nebulization, 2x/day   And  arformoterol (BROVANA) 15 mcg/2 mL nebulizer solution, 15 mcg, Nebulization, 2x/day  aspirin  (ECOTRIN) enteric coated tablet 81 mg, 81 mg, Oral, Daily  benzonatate  (TESSALON ) capsule, 100 mg, Oral, Q8H PRN  [Held by provider] carvedilol (COREG) tablet, 3.125 mg, Oral, 2x/day-Food  cetirizine (zyrTEC) tablet, 10 mg, Oral, Daily PRN  Correction/SSIP insulin  lispro 100 units/mL injection, 4-18 Units, Subcutaneous, 4x/day AC  cyclobenzaprine  (FLEXERIL ) tablet, 10 mg, Oral, 3x/day PRN  NS 250 mL flush bag, , Intravenous, Q15 Min PRN   And  D5W 250 mL flush bag, , Intravenous, Q15 Min PRN  dextrose  (GLUTOSE) 40% oral gel, 15 g, Oral, Q15 Min PRN  dextrose  50% (0.5 g/mL) injection - syringe, 12.5 g, Intravenous, Q15 Min PRN  doxycycline  hyclate (VIBRAMYCIN ) capsule, 100 mg, Oral, 2x/day  enoxaparin  PF (LOVENOX ) 40 mg/0.4 mL SubQ injection, 40 mg, Subcutaneous, Q24H  fenofibrate (TRICOR) 145 mg tablet, 145 mg, Oral, Daily with Breakfast  fluticasone  (FLONASE ) 50 mcg  per spray nasal spray, 2 Spray, Each Nostril, Daily  folic acid  (FOLVITE ) tablet, 1 mg, Oral, Daily  glucagon  (GLUCAGEN) injection 1 mg, 1 mg, IntraMUSCULAR, Once PRN  guaiFENesin  (MUCINEX ) extended release tablet - for cough (expectorant), 600 mg, Oral, 2x/day  hydrALAZINE  (APRESOLINE ) injection 10 mg, 10 mg, Intravenous, Q6H PRN  hydroCHLOROthiazide (HYDRODIURIL) tablet, 25 mg, Oral, Daily  insulin  glargine-yfgn 100 units/mL injection, 20 Units, Subcutaneous, 2x/day  lisinopril  (PRINIVIL ) tablet, 40 mg, Oral, Daily  NS flush syringe, 3 mL, Intracatheter, Q8HRS  NS flush syringe, 3 mL, Intracatheter, Q1H PRN  ondansetron  (ZOFRAN ) 2 mg/mL injection, 4 mg, Intravenous, Q6H PRN  pantoprazole  (PROTONIX ) delayed release tablet, 40 mg, Oral, Daily  spironolactone (ALDACTONE) tablet, 50 mg, Oral, Daily with  Breakfast        Physical Exam:  Constitutional: awake, alert, appears stated age. No acute distress noted.    Eyes: Pupils equal and round, reactive to light and accomodation. , Sclera non-icteric. , normal findings: lids and lashes normal and conjunctivae and sclerae normal  ENT: Head atraumatic and normocephalic, External Ears: normal pinnae shape and position and no signs of inflammation, Nose without erythema. , Mouth mucous membranes moist. , No oral lesions.   Neck: no thyromegaly or lymphadenopathy, supple, symmetrical, trachea midline, and no adenopathy.   Respiratory:   Clear.   Cardiovascular: regular rate and rhythm, S1, S2 normal.    Gastrointestinal: non-distended, Soft, non-tender, Bowel sounds normal, No hepatosplenomegaly, No masses.    Musculoskeletal:  moves all 4 extremities , no edema noted.   Integumentary:  Skin warm and dry and Skin color, texture, turgor normal. No rashes or lesions  Neurologic: Grossly normal.  Following commands, CN II - XII grossly intact , No tremor  Lymphatic/Immunologic/Hematologic: No lymphadenopathy  Psychiatric:  Pleasant affect.  Insight and judgment seem intact.       Labs:     Results for orders placed or performed during the hospital encounter of 11/15/23 (from the past 24 hours)   [facility]   Result Value Ref Range    Supplier Name [employer]     Supplier Phone [phone]     Delivery Status Processing     Delivery Note      Requested Delivery Date 11/20/2023     Item Description Rollator Not Specific Color     Item Description Seat Attachment    POC BLOOD GLUCOSE (RESULTS)   Result Value Ref Range    GLUCOSE, POC 205 (H) 70 - 100 mg/dl   POC BLOOD GLUCOSE (RESULTS)   Result Value Ref Range    GLUCOSE, POC 244 (H) 70 - 100 mg/dl   POC BLOOD GLUCOSE (RESULTS)   Result Value Ref Range    GLUCOSE, POC 234 (H) 70 - 100 mg/dl      Radiology:      Results for orders placed or performed during the hospital encounter of 11/15/23 (from the past 2160  hours)   XR CHEST PA AND LATERAL     Status: None    Narrative    Dai S Scalise    XR CHEST PA AND LATERAL 11/15/2023 8:54 PM    CLINICAL HISTORY: dyspnea  Dyspnea    TECHNIQUE:  2 views of the chest were submitted on 2 images.    COMPARISON: November 09, 2023      FINDINGS:       Heart size normal.  No infiltrate, effusion or edema.  No pneumothorax.  No evidence of interstitial disease.  No focal or acute osseous abnormality.        Impression    No radiographic evidence of acute cardiopulmonary process.            Radiologist location ID: [id number]     CT ANGIO CHEST FOR PULMONARY EMBOLUS W IV CONTRAST     Status: None    Narrative    Girtha S Gurganus    CT ANGIO CHEST FOR PULMONARY EMBOLUS W IV CONTRAST performed on 11/16/2023 2:30 AM.    INDICATION:  Shortness a breath   Additional History:  dyspnea    TECHNIQUE:  CT angiography of the chest performed for evaluation of the pulmonary arteries, with axial, sagittal, and coronal multiplanar reformations and a coronal 3D MIP.  Dose modulation, automated exposure control, and/or iterative reconstruction were used for dose reduction.    CONTRAST: 100 mL of Isovue 370 IV    COMPARISON:  CT 11/09/2023  ___________________________________  FINDINGS:    HEART / GREAT VESSELS: There are no filling defects in the pulmonary arteries to suggest a pulmonary embolism.  The heart is normal in size without pericardial effusion.  Atherosclerotic calcification of the thoracic aorta without aneurysmal dilation. Mild atherosclerotic calcification of the aortic annulus.SABRA     LUNGS / AIRWAYS: Solid nodule in the right upper lobe adjacent to the fissure measuring approximately 8 x 6 mm similar to 11/09/2023 CT. Adjacent 3 mm. perifissural nodule (series 6, image 23) which may represent lymph node. Elongated left upper lobe groundglass opacity similar to 11/09/2023 CT. Negative for pleural effusion or pneumothorax.  The central airways are patent.    MEDIASTINUM: There is no hilar or  mediastinal lymphadenopathy.    UPPER ABDOMEN: Negative for acute abnormality.     BONES:  Degenerative changes of the thoracic spine.. Negative for acute abnormality    OTHER:  The visualized thyroid gland is unremarkable.  ___________________________________    Impression    1. Negative for acute pulmonary embolus.  2. Solid pulmonary nodule in the right upper lobe measuring 8 mm. Findings may be further evaluated with PET CT or follow-up CT chest.  3. Similar appearance of elongated mixed solid and groundglass opacity in the left upper lobe which may also be further evaluated with PET CT.      Radiologist location ID: [id number]     XR HANDS BILATERAL 2 VIEW     Status: None    Narrative    Aoife S Montini    XR HANDS BILATERAL 2 VIEW performed on 11/20/2023 3:20 PM.    INDICATION:  Evaluate for erosive changes / inflammatory arthritis   Additional History:  Evaluate for erosive changes    TECHNIQUE:  PA and lateral views of the right hand; PA and lateral views of the left hand.  4 views/4 images submitted for interpretation.    COMPARISON:  None available.  ___________________________________  FINDINGS:    RIGHT HAND:  Degenerative changes distal interphalangeal joint second through fifth digits and first carpometacarpal joint. Bone mineralization normal. No fracture or dislocation.    LEFT HAND:  Degenerative changes first carpometacarpal joint. No acute fracture or dislocation. Degenerative changes distal interphalangeal joints second through fifth fingers. No bone erosion. Bone mineralization normal.  ___________________________________    Impression    Degenerative changes. No acute bony injury.      Radiologist location ID: [id number]        Consults:   Assessment/ Plan:   Active Hospital Problems    Diagnosis  Hyperglycemia    Acute hypoxic respiratory failure (CMS HCC)    Chronic bronchitis    Snoring    Diabetes mellitus type 2, insulin  dependent (CMS HCC)    Hypertensive heart disease without  heart failure    Gastroesophageal reflux disease without esophagitis    Primary hypertension    Dependent edema    Uncomplicated asthma, unspecified asthma severity, unspecified whether persistent    Lung nodules     Patient seen and examined  Chart reviewed  Tele shows sinus  CT chest shows 8mm RUL nodule  68 y/o presents with SOB and swelling  PFT shows small airway disease but no overt restriction/obstruction  ILD lab w/u showing RF of 181. ACE negative. ANCA pending. Other ILD labs negative.  Rheum eval and are planning OP f/u.   Echo notes that her RVSP was elevated and moderate LVH  Appreciate cards input. Have adjusted BP meds  Continues to c/o bradycardia and states her HR dropped to 30's while asleep  ONPO shows nearly of desats, however, has no hypercapnia, she will need an OP PSG to qualify for CPAP.  She does have strong symptoms of OSA.  Pt feels she can move mucus better today  Continue acapella and mucinex   Completed course of CAP treatment  will also need PET in few weeks for RUL nodule f/u  Pt has f/u with PET scan and will need to set up sleep study  Will discuss with my attending      Larraine LITTIE Marc, APRN, CNP,      Pulmonary attending:  Patient seen and examined personally by me  Pertinent labs/images/reports were reviewed personally by me  Chart records and history reviewed  Assessment and plan of care reviewed in detail  I performed majority of Medical Decision Making of this visit on this date of service acounting for greater than 50% of the medical visit.  Patient has acute and  chronic illness requiring review of testing, ordering of follow-up testing, and interpretation of test results.      Patient is stable for discharge from pulmonary standpoint   We will need outpatient polysomnography study as the patient did have some desaturation overnight but did not meet criteria for oxygen and did not qualify for BiPAP therapy   No hypercapnia noted   Patient does feel better overall    Continue efforts with mucus clearance   Encourage use of incentive spirometer   We will need PET scan scheduled in the next couple of weeks with follow up in the office after that    Bernardino LITTIE Hoyle, DO  11/21/2023 20:28

## 2023-11-21 NOTE — Telephone Encounter (Signed)
 Please add a PFT to the appt as well per Jolan from the hospital. Thank you

## 2023-11-21 NOTE — Telephone Encounter (Signed)
 Okey Jolan Caldron, APRN, CNP      11/20/23  1:37 PM  Note  In addition to the PFT's and PET previously requested, she'll need a sleep study as well. Ideally in lab if insurance will approve, home study if not.           Sent message to reception to add PFT'S to Procedure follow up.  Nothing noted of any OSA symptoms will need to order PSG at follow up.

## 2023-11-21 NOTE — Assessment & Plan Note (Signed)
 Had mild leukocytosis without obvious lung exam suggesting wheezing when patient is seen  ESR 41.  D-dimer 0.2.  Procalcitonin 0.06.  CRP 1.3.  ANA screening pending.  Continue DuoNeb PRN  Long-acting beta agonist and inhaled steroids.  Doxycycline  b.i.d.  Discontinued steroids

## 2023-11-21 NOTE — Telephone Encounter (Signed)
 Scheduled appts 10.29.25 - spoke with pt and mailed appt cards

## 2023-11-21 NOTE — Assessment & Plan Note (Signed)
 Outpatient sleep studies.  Probably has untreated sleep apnea  Obtain overnight pulse oximetry, 6 minute test

## 2023-11-21 NOTE — Assessment & Plan Note (Signed)
 A1c 9.7.  Uncontrolled with hyperglycemia.  Increase doses of Lantus  and sliding scale ADA diet

## 2023-11-21 NOTE — Assessment & Plan Note (Signed)
 With echo evidence of LVH  Continue , hydrochlorothiazide and lisinopril   Patient was seen by cardiologist Dr. Jodean. no further inpatient workup required  Added Aldactone

## 2023-11-21 NOTE — Telephone Encounter (Signed)
New encounter created.

## 2023-11-21 NOTE — Discharge Summary (Signed)
 [facility]  DISCHARGE SUMMARY    PATIENT NAME:  [name] [name], [name] [name]  MRN:  [id number]  DOB:  12/06/1955    DATE: 11/21/23  INPATIENT ADMISSION DATE: 11/15/2023  DISCHARGE DATE:  11/21/2023    ATTENDING PHYSICIAN: Jarel Cuadra, Balachandr*  SERVICE: [facility]PITAL[facility] 1  PRIMARY CARE PHYSICIAN: Rosina LOISE Furnish, NP       No lay caregiver identified.    PRIMARY DISCHARGE DIAGNOSIS: Dyspnea     Assessment & Plan  Dependent edema  Exertional shortness of breath likely multifactorial.  Troponin T within normal limit including BNP.    Echo revealed LVEF 60-65%.  Moderate LVH.  no significant valvular heart disease.    CTA angio chest no PE  Dependent edema could be due to hypertensive heart disease possibly secondary to Norvasc .  Evaluated by cardiologist for elevated RVSP.    However Echocardiogram showed estimated RVSP to be at upper limits of normal range with normal left ventricular systolic function.   Cardiology Coreg added instead of metoprolol  for better blood pressure control  Norvasc  discontinued due to ankle edema  Due to bradycardia Coreg has to be held  Increased dose of HCTZ and added aldactone  Acute hypoxic respiratory failure (CMS HCC)  Now needing O2 via nasal cannula.  Oxygen has been weaned off  PFT studies report no significant obstruction restrictive pattern  Rheumatologist consulted.   Latest Reference Range & Units 11/16/23 10:41   ANTI-DNA AB Negative  Negative   CYCLIC CITRULLINATED PEPTIDE ANTIBODY IGG  <3.0 U/mL <0.5   CYCLIC CITRULLINATED PEPTIDE ANTIBODY IGG QUAL Negative  Negative   ANA SCREEN WITH REFLEX  Rpt   ANA SCREEN Negative  Negative   RNP ANTIBODIES, IGG, QUALITATIVE Negative  Negative   RNP ANTIBODIES, IGG, QUANTITATIVE <1.0 AAU/mL <0.2   SS-A/RO ANTIBODIES, IGG, QUALITATIVE Negative  Negative   SS-B/LA ANTIBODIES, IGG, QUALITATIVE Negative  Negative      Latest Reference Range & Units 11/16/23 10:41   RHEUMATOID FACTOR 0 - 14 IU/mL 181 (H)   RHEUMATOID FACTOR, SERUM  Rpt  !   Autoimmune disease workup in progress  ACE and ANCA are pending   Ordered anti-CCP level.  Since patient claims she has rheumatoid arthritis however never followed up with a rheumatologist did not know how diagnosis was made.  Uncomplicated asthma, unspecified asthma severity, unspecified whether persistent  Chronic bronchitis  Had mild leukocytosis without obvious lung exam suggesting wheezing when patient is seen  ESR 41.  D-dimer 0.2.  Procalcitonin 0.06.  CRP 1.3.  ANA screening pending.  Continue DuoNeb PRN  Long-acting beta agonist and inhaled steroids.  Doxycycline  b.i.d.  Discontinued steroids  Lung nodules  Right upper lobe 8 mm pulmonary nodule Pulmonologist evaluated.  check interstitial lung disease panel as well  She will need PET scan in a few weeks to reassess lung nodularity and follow up .  Snoring  Outpatient sleep studies.  Probably has untreated sleep apnea  Obtain overnight pulse oximetry, 6 minute test  Diabetes mellitus type 2, insulin  dependent (CMS HCC)  A1c 9.7.  Uncontrolled with hyperglycemia.  Increase doses of Lantus  and sliding scale ADA diet  Hypertensive heart disease without heart failure  Primary hypertension  With echo evidence of LVH  Continue , hydrochlorothiazide and lisinopril   Patient was seen by cardiologist Dr. Jodean. no further inpatient workup required  Added Aldactone  Gastroesophageal reflux disease without esophagitis  Continue PPI  Hyperglycemia       Active Non-Hospital Problems  Diagnosis Date Noted    Sepsis 02/21/2023    Chest pain 02/23/2021    Breast pain, left 02/23/2021    Breast pain, right 02/23/2021    Family history of breast cancer 02/23/2021           Current Discharge Medication List        START taking these medications.        Details   albuterol  0.63 mg/3 mL Solution for Nebulization  Commonly known as: ACCUNEB   Replaces: albuterol  sulfate 90 mcg/actuation oral inhaler   0.63 mg, Nebulization, 4 TIMES DAILY PRN  Qty: 75 mL  Refills: 3      benazepriL  40 mg Tablet  Commonly known as: LOTENSIN    40 mg, Oral, Daily  Qty: 90 Tablet  Refills: 4     hydroCHLOROthiazide 25 mg Tablet  Commonly known as: HYDRODIURIL  Start taking on: November 22, 2023   25 mg, Oral, Daily  Qty: 30 Tablet  Refills: 0     spironolactone 50 mg Tablet  Commonly known as: ALDACTONE  Start taking on: November 22, 2023   50 mg, Oral, EVERY MORNING WITH BREAKFAST  Qty: 30 Tablet  Refills: 0            CONTINUE these medications which have CHANGED during your visit.        Details   Symbicort  160-4.5 mcg/actuation oral inhaler  Generic drug: budesonide -formoteroL   What changed: when to take this   2 Puffs, Inhalation, 2 TIMES DAILY  Qty: 10.2 g  Refills: 3            CONTINUE these medications - NO CHANGES were made during your visit.        Details   aspirin  81 mg Tablet, Delayed Release (E.C.)  Commonly known as: ECOTRIN   81 mg, Oral, Daily  Refills: 0     cetirizine 10 mg Tablet  Commonly known as: zyrTEC   10 mg, DAILY PRN  Refills: 0     cyclobenzaprine  10 mg Tablet  Commonly known as: FLEXERIL    10 mg, Oral, 3 TIMES DAILY PRN  Refills: 0     fenofibrate nanocrystallized 48 mg Tablet  Commonly known as: TRICOR   145 mg, Oral, EVERY MORNING WITH BREAKFAST, Patient states she takes higher mg of this medication. States she was on 160mg  daily.  Refills: 0     fluticasone  propionate 50 mcg/actuation Spray, Suspension  Commonly known as: FLONASE    2 Sprays, Each Nostril, Daily  Refills: 0     Mounjaro 5 mg/0.5 mL Pen Injector  Generic drug: tirzepatide   5 mg, Subcutaneous, EVERY 7 DAYS, Took 3 weeks ago  Refills: 0     omeprazole 40 mg Capsule, Delayed Release(E.C.)  Commonly known as: PRILOSEC   40 mg, Daily  Refills: 0     Synjardy 12.5-1,000 mg Tablet  Generic drug: empagliflozin-metformin   1 Tablet, Oral, 2 TIMES DAILY, Last taken on 10-7  Refills: 0            STOP taking these medications.      albuterol  sulfate 2.5 mg /3 mL (0.083 %) nebulizer solution  Commonly known as:  PROVENTIL      albuterol  sulfate 90 mcg/actuation oral inhaler  Commonly known as: PROVENTIL  or VENTOLIN  or PROAIR   Replaced by: albuterol  0.63 mg/3 mL Solution for Nebulization     Amlodipine -Benazepril  10-40 mg Capsule     metoprolol  succinate 25 mg Tablet Sustained Release 24 hr  Commonly known as:  TOPROL -XL     naproxen  sodium 550 mg Tablet  Commonly known as: ANAPROX      predniSONE  20 mg Tablet  Commonly known as: DELTASONE             Discharge med list refreshed?  YES     Allergies[1]  HOSPITAL PROCEDURE(S):   No orders of the defined types were placed in this encounter.      REASON FOR HOSPITALIZATION AND HOSPITAL COURSE   68 year old female ex-smoker with suspected asthma/Chronic Obstructive Pulmonary Disease, diabetes and hypertension presented with bilateral lower extremity swelling and uncontrolled blood sugar as well as dyspnea on exertion.  Since admission patient was seen by pulmonologist.  Patient does not need follow up with pulmonology patient uses PRN inhaler.  No history of cardiac failure.  She quit smoking 26 years ago however smoked for 20 years 1 pack per day.    Also complain  lots of snoring never had sleep studies.  CT chest in the past revealed right upper nodularity 6 mm now increased to 8 mm also ground-glass opacity in left side -stable chronic.  Does not use home O2.     11/17/23: seen by Cardiology today for elevated RV systolic pressure.  Metoprolol  changed to Coreg.  Recommendation to avoid Norvasc  due to leg edema.    PFT did not show significant obstruction or restriction.    Probably need outpatient Rheumatology evaluation        11/18/2023 : Denies hemoptysis, fever or bleeding manifestation.  No prior history of invasive cardiac workup.  She had some weird sensation on both side of the neck when she took shower.  Try to wean oxygen.  Patient is feeling like mucus in her chest unable to cough out.  Evaluated by Pulmonary given albuterol  and acapella .  Atrovent Discontinued to  prevent dryness of secretions.      11/20/2023- Patient reported symptomatic improvement in dyspnea. 6 min walk test did not reveal any significant hypoxia. She was noted to have a few episodes of bradycardia as low as 35bpm. Both coreg and  metoprolol  were held. She did have significant hypertension for which she was started on Aldactone and HCTZ. Discussed with cardiology who recommended MCOT on discharge. Rheumatoid workup revealed elevated RF. CCP was pending. Rheumatology was consulted who recommended outpatient follow up for further workup- although to note she did not complain any small joint arthritis symptoms.   For uncontrolled diabetes she was started on Lantus  20 units BID. She was given a script for Glucometer and testing supplies.     Patient was otherwise Hemodynamically stable and discharged home with advise to follow up with PCP, pulmonology and cardiology.     PHYSICAL EXAM:  BP (!) 158/47   Pulse 58   Temp 36.8 C (98.2 F)   Resp 19   Ht 1.6 m (5' 3)   Wt 86.2 kg (190 lb)   SpO2 96%   BMI 33.66 kg/m       Body mass index is 33.66 kg/m. ,  obese built,  afebrile,  not dyspneic,  on room air  Short neck  CVS:  S1 S2  regular, distant,   No murmurs, 1+ ankle leg edema.  Res. Sys:  Diminished breath sounds,  No Rales,  No Wheezing.   GI:  Abdomen, obese,  no distension,  no tenderness,  no masses  Mus. Skele. Sys:  1+ pitting pedal and ankle leg Edema,  no Calf tenderness  Skin:  no Cyanosis,  no  icterus.  No Rash  CNS:  AA x O3,  no focal deficits,  no tremors    CONDITION ON DISCHARGE:  A. Ambulation: Full ambulation  B. Self-care Ability: Complete  C. Cognitive Status Alert and Oriented x 3  D. Code status at discharge:       DISCHARGE DISPOSITION:  Home discharge  DISCHARGE INSTRUCTIONS:  Post-Discharge Follow Up Appointments       Follow up with Vicenta Beaulah LABOR, DO    Phone: [phone]    Where: [address], [place] [address]    Follow up with Family Medicine,  [facility]    Phone: [phone]    Where: [address] [place] [address]    Follow up with Endocrinology, [facility]    Phone: [phone]    Where: [address], [place] [address]    Follow up with Delphina Rosina SAILOR, NP in 1 week    Phone: [phone]    Where: [address] [place], [facility] [place] [address]    Call Jodean Savant, MD in 2 weeks    Phone: [phone]    Where: [facility]    Call Francis Dominion, MD in 2 weeks    Phone: [phone]    Where: [facility]    Call Arnold Pulling, MD in 1 month    Phone: [phone]    Where: [facility]      Wednesday Nov 29, 2023    Debby PET with [facility] PET 1 at  8:15 AM      Imaging Services, [facility], [place]  [address]  [place] [address]  [phone]             DISCHARGE INSTRUCTION - DIABETIC DIET     Diet: DIABETIC DIET      FOLLOW-UP: FAMILY MEDICINE - [facility] - [facility] - [place] [place]     Reason for visit: HOSPITAL DISCHARGE    Post Discharge Destination: Home      FOLLOW-UP: ENDOCRINOLOGY - [facility] - [facility] - [place], [place]     Reason for visit: HOSPITAL DISCHARGE    Post Discharge Destination: Home    Diagnosis Diabetes mellitus [821050]      [facility]          Estell JONELLE Askew, MD    Copies sent to Care Team         Relationship Specialty Notifications Start End    Delphina Rosina SAILOR, NP PCP - General FAMILY NURSE PRACTITIONER  12/26/22     Phone: [phone] Fax: [phone]         [phone] [facility]  [address]    Francis Dominion, MD  PULMONARY DISEASE  11/20/23     Phone: [phone] Fax: [phone]         [address] [place] [address]            Referring providers can utilize [email or web address] to access their referred [facility] patient's information.                    Patient was seen and  examined on the day of discharge.  Approximately 36 minutes were spent on discharge services on the day of discharge.         [1]   Allergies  Allergen Reactions    Ceftriaxone   Other Adverse Reaction (Add comment)  Unsure if it was a steriod or ceftriaxone . It was given at same time. Tongue swelling reported previously     Dilaudid [Hydromorphone]

## 2023-11-22 LAB — ANCA SCREEN WITH REFLEX TO ANCA TITER: ANCA SCREEN: NEGATIVE

## 2023-11-24 ENCOUNTER — Encounter (INDEPENDENT_AMBULATORY_CARE_PROVIDER_SITE_OTHER): Payer: Self-pay | Admitting: INTERVENTIONAL CARDIOLOGY

## 2023-11-28 ENCOUNTER — Other Ambulatory Visit: Payer: Self-pay

## 2023-11-29 ENCOUNTER — Ambulatory Visit
Admission: RE | Admit: 2023-11-29 | Discharge: 2023-11-29 | Disposition: A | Discharge status: Home / Self Care | Payer: Self-pay | Source: Ambulatory Visit | Attending: PULMONARY DISEASE | Admitting: PULMONARY DISEASE

## 2023-11-29 DIAGNOSIS — R918 Other nonspecific abnormal finding of lung field: Secondary | ICD-10-CM

## 2023-11-29 DIAGNOSIS — R0600 Dyspnea, unspecified: Secondary | ICD-10-CM

## 2023-11-30 ENCOUNTER — Telehealth (INDEPENDENT_AMBULATORY_CARE_PROVIDER_SITE_OTHER): Payer: Self-pay | Admitting: PULMONARY DISEASE

## 2023-11-30 NOTE — Telephone Encounter (Signed)
 Pt had to reschedule PET to 12/08/2203 at 11:15am. Please ,pve upcoming appointment and PFT's out 1wk after 12/08/2023.

## 2023-12-01 NOTE — Telephone Encounter (Signed)
 RET Proc and PFT rescheduled to 10.6.25 - spoke with pt and mailed appt cards

## 2023-12-06 ENCOUNTER — Ambulatory Visit (INDEPENDENT_AMBULATORY_CARE_PROVIDER_SITE_OTHER): Payer: Self-pay

## 2023-12-08 ENCOUNTER — Other Ambulatory Visit: Payer: Self-pay

## 2023-12-08 ENCOUNTER — Ambulatory Visit
Admission: RE | Admit: 2023-12-08 | Discharge: 2023-12-08 | Disposition: A | Payer: Self-pay | Source: Ambulatory Visit | Attending: PULMONARY DISEASE

## 2023-12-08 DIAGNOSIS — R918 Other nonspecific abnormal finding of lung field: Secondary | ICD-10-CM

## 2023-12-18 ENCOUNTER — Ambulatory Visit (INDEPENDENT_AMBULATORY_CARE_PROVIDER_SITE_OTHER): Payer: Self-pay

## 2023-12-18 DIAGNOSIS — R0602 Shortness of breath: Secondary | ICD-10-CM

## 2023-12-18 DIAGNOSIS — R001 Bradycardia, unspecified: Secondary | ICD-10-CM

## 2023-12-18 LAB — MCOT - 14 DAY CONTINUOUS
Enrollment Period End: 20251028100126
Enrollment Period Start: 20251014100126
Heart rate (average): 77 {beats}/min
Isolated SVE count: 2200 episodes
Isolated VE Counts: 566 episodes
Longest supraventricular tachycardia episode - duration: 14.2 s
Longest supraventricular tachycardia episode - heart rate (: 98 {beats}/min
Longest supraventricular tachycardia episode - number of be: 23 beats
SVE Couplets Counts: 34 episodes
SVE Triplets Counts: 10 episodes
Supraventricular tachycardia - heart rate (average): 142 {beats}/min
Supraventricular tachycardia - number of episodes: 18
Supraventricular tachycardia with fastest heart rate - dura: 2 s
Supraventricular tachycardia with fastest heart rate - hear: 147 {beats}/min
Supraventricular tachycardia with fastest heart rate - numb: 5 beats
Ventricular tachycardia - heart rate (average): -1 {beats}/min

## 2023-12-19 ENCOUNTER — Ambulatory Visit: Payer: Self-pay | Attending: NURSE PRACTITIONER | Admitting: NURSE PRACTITIONER

## 2023-12-19 ENCOUNTER — Ambulatory Visit (INDEPENDENT_AMBULATORY_CARE_PROVIDER_SITE_OTHER): Admission: RE | Admit: 2023-12-19 | Payer: Self-pay | Source: Ambulatory Visit

## 2023-12-19 ENCOUNTER — Other Ambulatory Visit: Payer: Self-pay

## 2023-12-19 ENCOUNTER — Encounter (INDEPENDENT_AMBULATORY_CARE_PROVIDER_SITE_OTHER): Payer: Self-pay | Admitting: NURSE PRACTITIONER

## 2023-12-19 VITALS — BP 185/77 | HR 73 | Temp 97.7°F | Resp 18 | Ht 63.0 in | Wt 189.0 lb

## 2023-12-19 DIAGNOSIS — I10 Essential (primary) hypertension: Secondary | ICD-10-CM

## 2023-12-19 DIAGNOSIS — Z87891 Personal history of nicotine dependence: Secondary | ICD-10-CM

## 2023-12-19 DIAGNOSIS — G4719 Other hypersomnia: Secondary | ICD-10-CM | POA: Insufficient documentation

## 2023-12-19 DIAGNOSIS — G4734 Idiopathic sleep related nonobstructive alveolar hypoventilation: Secondary | ICD-10-CM | POA: Insufficient documentation

## 2023-12-19 DIAGNOSIS — R918 Other nonspecific abnormal finding of lung field: Secondary | ICD-10-CM | POA: Insufficient documentation

## 2023-12-19 DIAGNOSIS — R0602 Shortness of breath: Secondary | ICD-10-CM

## 2023-12-19 MED ORDER — BUDESONIDE-FORMOTEROL HFA 160 MCG-4.5 MCG/ACTUATION AEROSOL INHALER
2.0000 | INHALATION_SPRAY | Freq: Two times a day (BID) | RESPIRATORY_TRACT | 3 refills | Status: AC
Start: 2023-12-19 — End: ?

## 2023-12-19 MED ORDER — ALBUTEROL SULFATE HFA 90 MCG/ACTUATION AEROSOL INHALER
1.0000 | INHALATION_SPRAY | RESPIRATORY_TRACT | 3 refills | Status: AC | PRN
Start: 2023-12-19 — End: ?

## 2023-12-19 NOTE — Nursing Note (Signed)
 Neck Circumference:  16  Epworth Score:  16     How often do you snore?  frequently  How often do you stop breathing?  frequently  Are you experiencing current symptoms of OSA? Gasping, Choking, Daytime Sleepiness, Non-Restorative Sleep , Restless Leg, and Witness Apneas  Are you currently on CPAP/BIPAP? n/a  DME Company Information: n/a  Have you had a previous sleep study?  No   If yes, what type of sleep study was it?  How long ago was the sleep study?  N/a  Location of previous sleep study?  N/a  Download available? No, Reason:    Oxygen therapy?  No  DME Company Information: n/a  What Liter Flow is the patient on? N/a  Have you received a flu shot? No  Have you received a pneumonia shot? No  Smoking History:    Social History     Tobacco Use   Smoking Status Former    Current packs/day: 0.00    Average packs/day: 1 pack/day for 29.2 years (29.2 ttl pk-yrs)    Types: Cigarettes    Start date: 46    Quit date: 04/07/1997    Years since quitting: 26.7   Smokeless Tobacco Never     Have you had any recent hospitalizations?  Yes  Has the patient had any tests performed since the last visit? CT, Chest Xray, and PET Scan  If so, where were the test(s) performed? [facility] and [facility]  History of prior stroke?  No  History of Myocardial Infarction?  No  Suspicion of Period leg movement during sleep?  no  Leg pain?  Yes, hips and feet.  When do you experience leg pain?  More at night.  Does the patient have any other associated symptoms or modifying factors? Shortness of breath and Wheezing      Palmira Silvan, MA

## 2023-12-19 NOTE — Progress Notes (Signed)
 PULMONARY, [facility] PULMONOLOGY  [address]  [place] [address]  Operated by [facility]     Follow up/Progress Note    Patient Name: FLORDIA KASSEM  Date: 12/19/2023  Department:  PULMONARY, [facility] PULMONOLOGY  MRN: [id number]  DOB: 1955/07/15  Primary Care Provider:  Rosina LOISE Furnish, NP  Referring Provider:  Rosina LOISE Furnish, NP      Chief Complaint:   Chief Complaint   Patient presents with    Follow Up     Procedure f/u.    Asthma    Shortness of Breath     Nursing Notes:   Young [place], [place]  12/19/23 1003  Signed  Neck Circumference:  16  Epworth Score:  16     How often do you snore?  frequently  How often do you stop breathing?  frequently  Are you experiencing current symptoms of OSA? Gasping, Choking, Daytime Sleepiness, Non-Restorative Sleep , Restless Leg, and Witness Apneas  Are you currently on CPAP/BIPAP? n/a  DME Company Information: n/a  Have you had a previous sleep study?  No   If yes, what type of sleep study was it?  How long ago was the sleep study?  N/a  Location of previous sleep study?  N/a  Download available? No, Reason:    Oxygen therapy?  No  DME Company Information: n/a  What Liter Flow is the patient on? N/a  Have you received a flu shot? No  Have you received a pneumonia shot? No  Smoking History:    Social History     Tobacco Use   Smoking Status Former    Current packs/day: 0.00    Average packs/day: 1 pack/day for 29.2 years (29.2 ttl pk-yrs)    Types: Cigarettes    Start date: 51    Quit date: 04/07/1997    Years since quitting: 26.7   Smokeless Tobacco Never     Have you had any recent hospitalizations?  Yes  Has the patient had any tests performed since the last visit? CT, Chest Xray, and PET Scan  If so, where were the test(s) performed? [facility]  History of prior stroke?  No  History of Myocardial Infarction?  No  Suspicion of Period leg movement during sleep?  no  Leg pain?  Yes, hips and feet.  When do you experience  leg pain?  More at night.  Does the patient have any other associated symptoms or modifying factors? Shortness of breath and Wheezing      Crawford Young, MA      HPI:    Patient with a history of asthma, hypertension, shortness breath, pulmonary nodules, history of covid (hospitalized twice for this) and bradycardia who is here for new hospital/procedure follow up.    She was admitted to [facility] on 11/15/2023 with worsening shortness of breath and leg swelling.  Shortness of breath was ultimately thought to be multifactorial. Sputum culture was negative. Echo showed EF 60-65%, moderate LVH, no significant valvular disease, and RVSP 32 mmHg. Cardiology adjusted medication inpatient.  Did have heart monitor that showed minimum heart rate of 41 and max of 190.  She had 18 runs of SVT.    CTA chest was negative for PE but showed 8 mm RUL nodule (previously measured 6 mm on 07/2023 and 01/2021).  She also had stable 1.7 cm solid/ground-glass nodular density within the LUL.  PET on 12/08/2023 showed no hypermetabolic activity and 8 mm RUL nodule.  PFTs showed small airway disease but no over obstruction/restriction. Has a history of asthma and remains on albuterol  PRN at home and we sent Symbicort . Does feel like her shortness of breath/exercise tolerance has improved. Does not shortness of breath, inflammation, hypertension, and diabetes worsened after having COVID several years ago.    Did have some bradycardia at nighttime during admission. ONPO showed nearly 7 minutes of desaturation.  She was recommended outpatient sleep study qualify for PSG she had no hypercapnia during admission. Does have daytime sleepiness.     Patient had reported history of rheumatoid arthritis but never followed with rheumatologist.  Vascular labs showed RF of 181 and elevated sed rate. She was seen by Dr. Arnold and recommended further workup outpatient and is scheduled for march.      Quit smoking in 1999. Denies history of respiratory  exposures.      Past Medical History:  Past Medical History:   Diagnosis Date    Asthma     Diabetes mellitus, type 2     HTN (hypertension)          Past Surgical History  Past Surgical History:   Procedure Laterality Date    HX CESAREAN SECTION         Medication List  Current Outpatient Medications   Medication Sig    albuterol  (ACCUNEB ) 0.63 mg/3 mL Inhalation Solution for Nebulization Take 3 mL (0.63 mg total) by nebulization Four times a day as needed for Wheezing    aspirin  (ECOTRIN) 81 mg Oral Tablet, Delayed Release (E.C.) Take 1 Tablet (81 mg total) by mouth Once a day    benazepriL  (LOTENSIN ) 40 mg Oral Tablet Take 1 Tablet (40 mg total) by mouth Daily    budesonide -formoteroL  (SYMBICORT ) 160-4.5 mcg/actuation Inhalation oral inhaler Take 2 Puffs by inhalation Twice daily    cetirizine (ZYRTEC) 10 mg Oral Tablet Take 1 Tablet (10 mg total) by mouth Once per day as needed for Allergies    cyclobenzaprine  (FLEXERIL ) 10 mg Oral Tablet Take 1 Tablet (10 mg total) by mouth Three times a day as needed for Muscle spasms    fenofibrate nanocrystallized (TRICOR) 48 mg Oral Tablet Take 145 mg by mouth Every morning with breakfast Patient states she takes higher mg of this medication. States she was on 160mg  daily.    fluticasone  propionate (FLONASE ) 50 mcg/actuation Nasal Spray, Suspension Administer 2 Sprays into each nostril Daily    hydroCHLOROthiazide (HYDRODIURIL) 25 mg Oral Tablet Take 1 Tablet (25 mg total) by mouth Daily    insulin  aspart U-100 (NOVOLOG) 100 unit/mL (3 mL) Subcutaneous Insulin  Pen USE FOR SLIDING SCALE <150=0, 150-199=2 UNITS, 200-249=4 UNITS, 250-299=6 UNITS, 300-349=8 UNITS > OR 350=10UNITS (MAX 30 UNITS) (Patient not taking: Reported on 12/19/2023)    insulin  glargine (LANTUS  SOLOSTAR U-100 INSULIN ) 100 unit/mL Subcutaneous Insulin  Pen Inject 20 Units under the skin Twice daily    MOUNJARO 2.5 mg/0.5 mL Subcutaneous Pen Injector SMARTSIG:2.5 Milligram(s) Once a Week    MOUNJARO 5 mg/0.5  mL Subcutaneous Pen Injector Inject 0.5 mL (5 mg total) under the skin Every 7 days Took 3 weeks ago (Patient not taking: Reported on 12/19/2023)    omeprazole (PRILOSEC) 40 mg Oral Capsule, Delayed Release(E.C.) Take 1 Capsule (40 mg total) by mouth Daily    spironolactone (ALDACTONE) 50 mg Oral Tablet Take 1 Tablet (50 mg total) by mouth Every morning with breakfast    SYNJARDY 12.5-1,000 mg Oral Tablet Take 1 Tablet by mouth Twice daily Last taken on  10-7     Allergy List  Allergy History as of 12/19/23       PENICILLINS         Noted Status Severity Type Reaction    11/15/23 2021 Rulon Bari, RN 07/06/16 Deleted       07/06/16 1220 [place] Qu[place], [place] 07/06/16 Active                 HYDROMORPHONE         Noted Status Severity Type Reaction    07/06/16 1220 [place], [place] 07/06/16 Active                 STEROID         Noted Status Severity Type Reaction    02/23/21 1302 Tawni [place], [place] 07/06/16 Deleted       07/06/16 1221 [place], [place] 07/06/16 Active                 CEFTRIAXONE          Noted Status Severity Type Reaction    02/21/23 1028 Burnard Spear, RN 02/21/23 Active    Other Adverse Reaction (Add comment)    Comments: Unsure if it was a steriod or ceftriaxone . It was given at same time. Tongue swelling reported previously                    Family History   Family Medical History:    None         Social History  Social History     Socioeconomic History    Marital status: Married   Tobacco Use    Smoking status: Former     Current packs/day: 0.00     Average packs/day: 1 pack/day for 29.2 years (29.2 ttl pk-yrs)     Types: Cigarettes     Start date: 65     Quit date: 04/07/1997     Years since quitting: 26.7    Smokeless tobacco: Never   Vaping Use    Vaping status: Never Used   Substance and Sexual Activity    Alcohol use: Never    Drug use: Never     Social Determinants of Health     Social Connections: Low Risk (11/16/2023)    Social Connections     SDOH Social Isolation: 5 or more  times a week          Objective:  Vital Signs  BP (!) 185/77 Comment: stated has been having issues with BP.  Pulse 73   Temp 36.5 C (97.7 F)   Resp 18   Ht 1.6 m (5' 3)   Wt 85.7 kg (189 lb)   SpO2 95%   BMI 33.48 kg/m          PHYSICAL EXAMINATION:   General appearance of the patient:  Alert, no acute distress.  Normal appearance, well nourished.  Respiratory:  Auscultation of lungs with normal breath sounds, no rales, no rhonchi, no wheezing.  Respiratory effort with no tractions, breathing regular and unlabored.  Cardiovascular:  Regular rhythm and regular rate.    Mental Status/Psychiatric:  Alert, grossly oriented to person, place, and time.  Appropriate and normal mood.    Assessment    ICD-10-CM    1. Lung nodules  R91.8 CT CHEST WO IV CONTRAST      2. Abnormal CT scan of lung  R91.8 CT CHEST WO IV CONTRAST      3. Ground glass opacity present on imaging of lung  R91.8 CT CHEST WO IV CONTRAST          Imaging  [facility] - 14 DAY CONTINUOUS   Patient had a min HR of 41 bpm, max HR of 190 bpm, and avg HR of 77 bpm.   Predominant underlying rhythm was Sinus Rhythm. 18 Supraventricular   Tachycardia runs occurred, the run with the fastest interval lasting 5   beats with a max rate of 190 bpm, the longest lasting 14.2 secs with an   avg rate of 98 bpm. Supraventricular Tachycardia was detected within +/-   45 seconds of symptomatic patient event(s). Isolated SVEs were rare   (<1.0%), SVE Couplets were rare (<1.0%), and SVE Triplets were rare   (<1.0%). Isolated VEs were rare (<1.0%), and no VE Couplets or VE Triplets   were present.       Plan  Discussed with patient that RUL area has been slowly growing over time after reviewing previous imaging, but is PET negative. Will plan to repeat CT in 3 months.     Discussed liver enlargement. Will send results to PCP.     Continue Symbicort  and albuterol  PRN.    Will order in-lab sleep study for symptoms consistent with OSA.  Will try to get this scheduled at  [facility].    Recommended keeping appointment with Rheumatology in the spring.    Continue to follow with cardiology.    Patient was seen and examined by Dr. Francis.  He saw the patient with me. He is in agreement with the note and plan unless otherwise noted on his addendum.    Return in about 14 weeks (around 03/26/2024) for CT at follow up, in Dr. Ferne pod.     The patient was given the opportunity to ask questions and those questions were answered to the patient's satisfaction. The patient was encouraged to call with any additional questions or concerns. Discussed with the patient effects and side effects of medications. Medication safety was discussed.  The patient was informed to contact the office within 7 business days if a message/lab results/referral/imaging results have not been conveyed to the patient.    Electronically signed by Vernell Simpers, APRN, CNP      This note may have been partially generated using MModal Fluency Direct system, and there may be some incorrect words, spellings, and punctuation that were not noted in checking the note before saving.    I personally saw and evaluated the patient as part of a shared service with an APP.    My substantive findings are:  this is nice lady with remote smoking history, has small nodules, largest has been 8 mm, pet scan performed, no uptake at this time.  She is agreeable to 3 month repeat ct chest and evaluate again then.  Given hypertension, recommend sleep testing for apnea, will try to get tested down at [place].  Continue dyspnea support with Symbicort  bid and prn albuterol .    MDM (complete) I have personally managed 2 or more stable chronic illnesses (lung nodules, probable OSA, shortness of breath)  I have reviewed and actively participated in management of patient's prescription medications.          Trey Francis, MD  12/19/2023 18:08

## 2023-12-27 ENCOUNTER — Telehealth (INDEPENDENT_AMBULATORY_CARE_PROVIDER_SITE_OTHER): Payer: Self-pay | Admitting: PULMONARY DISEASE

## 2023-12-27 NOTE — Telephone Encounter (Signed)
Routed order to [place] per pt request.

## 2024-01-08 ENCOUNTER — Ambulatory Visit: Admitting: Internal Medicine

## 2024-01-08 ENCOUNTER — Other Ambulatory Visit: Payer: Self-pay

## 2024-01-08 ENCOUNTER — Encounter (HOSPITAL_BASED_OUTPATIENT_CLINIC_OR_DEPARTMENT_OTHER): Payer: Self-pay | Admitting: Internal Medicine

## 2024-01-08 VITALS — BP 150/90 | HR 96 | Temp 97.5°F | Ht 63.0 in | Wt 190.0 lb

## 2024-01-08 DIAGNOSIS — E1165 Type 2 diabetes mellitus with hyperglycemia: Secondary | ICD-10-CM | POA: Insufficient documentation

## 2024-01-08 DIAGNOSIS — E7849 Other hyperlipidemia: Secondary | ICD-10-CM | POA: Insufficient documentation

## 2024-01-08 MED ORDER — MOUNJARO 5 MG/0.5 ML SUBCUTANEOUS PEN INJECTOR: 5.0000 mg | PEN_INJECTOR | SUBCUTANEOUS | 5 refills | Status: AC

## 2024-01-08 MED ORDER — DEXCOM G7 SENSOR DEVICE: 5 refills | Status: AC

## 2024-01-08 NOTE — Addendum Note (Signed)
 Addended by: Mennie Spiller on: 01/08/2024 12:05 PM     Modules accepted: Orders

## 2024-01-08 NOTE — Nursing Note (Signed)
 01/08/24 1100   Required: Location Test Performed At:   [facility] - [address] [place], [place] [address]   Glucose  (Fingerstick)   Time Performed 1115   Glucose (Ref Range 74-106 mg/dl) 823   Lot # [id number]   Expiration Date 02/12/25   Internal Control Valid Yes   Initials sw

## 2024-01-08 NOTE — H&P (Signed)
 OUTPATIENT H&P NOTE  CC: Diabetes Mellitus, type 2  Subjective:   The history is provided by the patient.   Diabetes  She presents for her initial diabetic visit. She has type 2 diabetes mellitus. Her disease course has been worsening. There are no hypoglycemic associated symptoms. Associated symptoms include fatigue and foot paresthesias. Pertinent negatives for diabetes include no blurred vision, no chest pain, no foot ulcerations, no polydipsia, no polyphagia, no visual change, no weakness and no weight loss. (Nocturia 3-4x ) There are no hypoglycemic complications. 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 Pertinent negatives for diabetic complications include no CVA, heart disease, nephropathy, PVD or retinopathy. Current diabetic treatment includes insulin  injections and oral agent (dual therapy). She is compliant with treatment most of the time. Her weight is stable. She is following a generally healthy diet. She participates in exercise intermittently. Her home blood glucose trend is fluctuating dramatically. An ACE inhibitor/angiotensin II receptor blocker is being taken. She does not see a podiatrist.Eye exam is not current.      [name] [name], 05/14/1955,  is a 68 y.o. female who presents to the endocrine clinic today for evaluation and management  of Type 2 Diabetes Mellitus. Onset DM 2013.  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 HgA1C 9.7%.     Has diabetes and hyperlipidemia   History of diabetes since 2013  On insulin  since oct 2025   Currently on tresiba 30u qhs and Novolog s/s not every day   Also on mounjaro  2.5mg  q week and jardiance 25 mg no metformin caused stomack issues   Hba1c 9.7  was 8.8 sept 26   Checks BS bid   BS records reviewed  Lowest BS 155  Highest BS 400  Average BS 200  BS in office today 176    Hypoglycemia is not occurring  Last Diabetic Eye Exam: 2023 no retinopathy   Was in hospital in oct with  really high BS and started on insulin  then had sob cardiac workup ok had also high BP   ROS:   Review of Systems   Constitutional:  Positive for fatigue. Negative for weight loss.   Eyes:  Negative for blurred vision.   Cardiovascular:  Negative for chest pain.   Endocrine: Negative for polydipsia and polyphagia.   Neurological:  Negative for weakness.            Labs:  Reviewed    11/17/2023 cr 0.58 gluc 367 ca 8.9hba1c 9.7    Problem List / Past Medical History:   Problem List[1]     Medications:     Current Outpatient Medications   Medication Sig    albuterol  (ACCUNEB ) 0.63 mg/3 mL Inhalation Solution for Nebulization Take 3 mL (0.63 mg total) by nebulization Four times a day as needed for Wheezing    albuterol  sulfate (PROVENTIL  OR VENTOLIN  OR PROAIR ) 90 mcg/actuation Inhalation oral inhaler Take 1-2 Puffs by inhalation Every 4 hours as needed Indications: bronchospasm prevention    Amlodipine -Olmesartan 5-40 mg Oral Tablet Take 1 Tablet by mouth Daily    aspirin  (ECOTRIN) 81 mg Oral Tablet, Delayed Release (E.C.) Take 1 Tablet (81 mg total) by mouth Once a day    budesonide -formoteroL  (SYMBICORT ) 160-4.5 mcg/actuation Inhalation oral inhaler Take 2 Puffs by inhalation Twice daily Indications: controller medication for asthma    cetirizine  (ZYRTEC ) 10 mg Oral Tablet Take 1 Tablet (10 mg total) by mouth Once per day as needed for Allergies    cyclobenzaprine  (FLEXERIL )  10 mg Oral Tablet Take 1 Tablet (10 mg total) by mouth Three times a day as needed for Muscle spasms    fenofibrate  (LOFIBRA) 160 mg Oral Tablet Take 1 Tablet (160 mg total) by mouth Daily    fluticasone  propionate (FLONASE ) 50 mcg/actuation Nasal Spray, Suspension Administer 2 Sprays into each nostril Daily    hydroCHLOROthiazide  (HYDRODIURIL ) 25 mg Oral Tablet Take 1 Tablet (25 mg total) by mouth Daily    JARDIANCE 25 mg Oral Tablet Take 1 Tablet (25 mg total) by mouth Daily    omeprazole (PRILOSEC) 40 mg Oral Capsule, Delayed Release(E.C.) Take  1 Capsule (40 mg total) by mouth Daily    spironolactone  (ALDACTONE ) 50 mg Oral Tablet Take 1 Tablet (50 mg total) by mouth Every morning with breakfast    tirzepatide  (MOUNJARO ) 5 mg/0.5 mL Subcutaneous Pen Injector Inject 0.5 mL (5 mg total) under the skin Every 7 days    TRESIBA FLEXTOUCH U-100 100 unit/mL (3 mL) Subcutaneous Insulin  Pen INJECT 20 UNITS AT BEDTIME       Allergies:   Allergies[2]    Social History:   Social History[3]    Objective:   BP (!) 150/90   Pulse 96   Temp 36.4 C (97.5 F)   Ht 1.6 m (5' 3)   Wt 86.2 kg (190 lb)   SpO2 98%   BMI 33.66 kg/m       Physical Exam:  Physical Exam  Constitutional:       Appearance: Normal appearance. She is obese.   HENT:      Head: Normocephalic.   Neck:      Thyroid: No thyroid mass, thyromegaly or thyroid tenderness.   Cardiovascular:      Rate and Rhythm: Normal rate and regular rhythm.      Heart sounds: Normal heart sounds. No murmur heard.  Pulmonary:      Effort: Pulmonary effort is normal.      Breath sounds: Normal breath sounds.   Lymphadenopathy:      Cervical: No cervical adenopathy.   Neurological:      General: No focal deficit present.      Mental Status: She is alert.      Cranial Nerves: No cranial nerve deficit.   Psychiatric:         Attention and Perception: Attention normal.         Mood and Affect: Mood normal.         Speech: Speech normal.         Behavior: Behavior normal. Behavior is cooperative.         Cognition and Memory: Cognition and memory normal.         Judgment: Judgment normal.          Diabetic foot exam:  No edema bilaterally. No ulcerations or lesions bilaterally. Pulses normal bilaterally. Decreased sensation bilaterally          Assessment & Plan:   (E11.65) Poorly controlled type 2 diabetes mellitus  (primary encounter diagnosis)  Plan: POCT Blood Glucose/Fingerstick (AMB),         COMPREHENSIVE METABOLIC PNL, FASTING, HGA1C         (HEMOGLOBIN A1C WITH EST AVG GLUCOSE),         MICROALBUMIN/CREATININE RATIO,  URINE, RANDOM,         LIPID PANEL, CREATINE KINASE (CK), TOTAL, SERUM        OR PLASMA, THYROID STIMULATING HORMONE WITH         FREE  T4 REFLEX    (E78.49) Other hyperlipidemia  Plan: COMPREHENSIVE METABOLIC PNL, FASTING, HGA1C         (HEMOGLOBIN A1C WITH EST AVG GLUCOSE),         MICROALBUMIN/CREATININE RATIO, URINE, RANDOM,         LIPID PANEL, CREATINE KINASE (CK), TOTAL, SERUM        OR PLASMA, THYROID STIMULATING HORMONE WITH         FREE T4 REFLEX     Orders Placed This Encounter    COMPREHENSIVE METABOLIC PNL, FASTING    HGA1C (HEMOGLOBIN A1C WITH EST AVG GLUCOSE)    MICROALBUMIN/CREATININE RATIO, URINE, RANDOM    LIPID PANEL    CREATINE KINASE (CK), TOTAL, SERUM OR PLASMA    THYROID STIMULATING HORMONE WITH FREE T4 REFLEX    POCT Blood Glucose/Fingerstick (AMB)    tirzepatide  (MOUNJARO ) 5 mg/0.5 mL Subcutaneous Pen Injector     Increase mounjaro  to 5mg  q week   Needs to do s/s if bs >150   Qualifies for cgms will start dexcom g7   Niece who came with her can help her  Went over 1500cal diet   RTC 6 weeks with labs   Get an eye exam   Erick Isles MD                   [1]   Patient Active Problem List  Diagnosis    Chest pain    Breast pain, left    Breast pain, right    Family history of breast cancer    Sepsis    Shortness of breath    Dependent edema    Uncomplicated asthma, unspecified asthma severity, unspecified whether persistent    Lung nodules    Acute hypoxic respiratory failure (CMS HCC)    Chronic bronchitis    Snoring    Diabetes mellitus type 2, insulin  dependent (CMS HCC)    Hypertensive heart disease without heart failure    Gastroesophageal reflux disease without esophagitis    Hypertension, unspecified type    Hyperglycemia    Poorly controlled type 2 diabetes mellitus    Other hyperlipidemia   [2]   Allergies  Allergen Reactions    Bee Venom Protein (Honey Bee)     Ceftriaxone   Other Adverse Reaction (Add comment)     Unsure if it was a steriod or ceftriaxone . It was given at same time.  Tongue swelling reported previously     Dilaudid [Hydromorphone]     Flu Vaccine [Influenza Virus Vaccines]    [3]   Social History  Tobacco Use    Smoking status: Former     Current packs/day: 0.00     Average packs/day: 1 pack/day for 29.2 years (29.2 ttl pk-yrs)     Types: Cigarettes     Start date: 59     Quit date: 04/07/1997     Years since quitting: 26.7    Smokeless tobacco: Never   Vaping Use    Vaping status: Never Used   Substance Use Topics    Alcohol use: Never    Drug use: Never

## 2024-01-08 NOTE — Patient Instructions (Signed)
 patient advised to be compliant with medications and monitoring blood sugar  advised to have a yearly eye exam   advised about foot care and see a podiatrist if necessary  explained short term and long term complications of uncontrolled diabetes   discussed symptoms and signs of hypoglycemia and hyperglycemia and what to do in each situation   discussed targets for lipids  discussed target for BP  discussed targets for blood sugars and HBA1C taking consideration age ,duration of diabetes ,risk of hypoglycemia cardiac and neurological factors

## 2024-01-08 NOTE — Procedures (Signed)
 ENDOCRINOLOGY, [facility]  [address]  [place] [address]  Operated by [facility]  Procedure Note    Name: TALIANA MERSEREAU MRN:  [id number]   Date: 01/08/2024 DOB:  [birth year] (68 y.o.)         95250 - CONTINUOUS GLUCOSE MONITORING OF INTERSTITIAL TISSUE FLUID VIA A SUBCUTANEOUS SENSOR FOR A MINIMUM OF 72 HOURS (AMB ONLY)    Performed by: Gifford Night, MD  Authorized by: Gifford Night, MD    Start dexcom g7 on phone     Night Gifford, MD

## 2024-01-09 ENCOUNTER — Telehealth (HOSPITAL_COMMUNITY): Payer: Self-pay | Admitting: Gastroenterology

## 2024-01-09 NOTE — Telephone Encounter (Signed)
(   1st attempt ) Unable to submit auth due to the patient's  Labs have not been completed  Need to send current labs A1C .   I will continue to follow up and update chart  -CRO will continue to follow up and update chart Diabetes Education Referral to send to  [facility] [phone] .Will send referral when lab reports are available . Referral Packet Started placed in zip file.   I called the patient call would not go through I called her daughter Amy left message to verify when the patient will be getting the lab work completed to be able to send the referral out .     CRO will continue to follow up until patient is scheduled.    Melissa Duffy  01/09/2024 12:44

## 2024-01-16 ENCOUNTER — Telehealth (HOSPITAL_COMMUNITY): Payer: Self-pay | Admitting: Gastroenterology

## 2024-01-16 NOTE — Telephone Encounter (Signed)
(   2nd attempt )  Unable to submit referral  due to the patient's  Labs have not been completed  Need to send current labs A1C .   I will continue to follow up and update chart  -CRO will continue to follow up and update chart Diabetes Education Referral to send to  [facility] Diabetes Education Program [phone] .Will send referral when lab reports are available . Referral Packet Started placed in zip file.   I called patient I spoke the patient verified she will have the labs completed a day this week depending on the weather.     CRO will continue to follow up until patient is scheduled.     Melissa Duffy  01/16/2024 11:58

## 2024-01-17 ENCOUNTER — Other Ambulatory Visit: Payer: Self-pay

## 2024-01-17 ENCOUNTER — Ambulatory Visit: Attending: Internal Medicine

## 2024-01-17 DIAGNOSIS — E1165 Type 2 diabetes mellitus with hyperglycemia: Secondary | ICD-10-CM | POA: Insufficient documentation

## 2024-01-17 DIAGNOSIS — E7849 Other hyperlipidemia: Secondary | ICD-10-CM | POA: Insufficient documentation

## 2024-01-17 LAB — HGA1C (HEMOGLOBIN A1C WITH EST AVG GLUCOSE): HEMOGLOBIN A1C: 9.4 % — ABNORMAL HIGH (ref 4.0–6.0)

## 2024-01-22 ENCOUNTER — Other Ambulatory Visit (HOSPITAL_BASED_OUTPATIENT_CLINIC_OR_DEPARTMENT_OTHER): Payer: Self-pay | Admitting: Internal Medicine

## 2024-01-22 MED ORDER — TRESIBA FLEXTOUCH U-100 INSULIN 100 UNIT/ML (3 ML) SUBCUTANEOUS PEN: PEN_INJECTOR | SUBCUTANEOUS | 3 refills | Status: AC

## 2024-01-23 ENCOUNTER — Other Ambulatory Visit: Payer: Self-pay

## 2024-01-23 ENCOUNTER — Ambulatory Visit: Payer: Self-pay

## 2024-01-23 ENCOUNTER — Telehealth (HOSPITAL_COMMUNITY): Payer: Self-pay

## 2024-01-23 ENCOUNTER — Encounter (INDEPENDENT_AMBULATORY_CARE_PROVIDER_SITE_OTHER): Payer: Self-pay

## 2024-01-23 DIAGNOSIS — E119 Type 2 diabetes mellitus without complications: Secondary | ICD-10-CM

## 2024-01-23 DIAGNOSIS — Z7985 Long-term (current) use of injectable non-insulin antidiabetic drugs: Secondary | ICD-10-CM

## 2024-01-23 DIAGNOSIS — E7849 Other hyperlipidemia: Secondary | ICD-10-CM

## 2024-01-23 DIAGNOSIS — Z8249 Family history of ischemic heart disease and other diseases of the circulatory system: Secondary | ICD-10-CM

## 2024-01-23 DIAGNOSIS — I119 Hypertensive heart disease without heart failure: Secondary | ICD-10-CM

## 2024-01-23 DIAGNOSIS — Z7984 Long term (current) use of oral hypoglycemic drugs: Secondary | ICD-10-CM

## 2024-01-23 DIAGNOSIS — Z87891 Personal history of nicotine dependence: Secondary | ICD-10-CM

## 2024-01-23 NOTE — Telephone Encounter (Signed)
 Called Diabetes Education Referral Faxed to [facility] phone [phone] and spoke with Particia stated spoke with patient and patient will call her back stated call back in a couple for the referral status    Defer for 4 days CRO will follow up until the patient has been scheduled.

## 2024-01-23 NOTE — Progress Notes (Unsigned)
 Cardiology [facility]  [address]  [place] [address]  [phone]    Cardiology  Clinic Note    Name: [name] [name]   DOB: January 19, 1956  [facility] y.o. female]   MRN: [id number]       Visit Date: 01/23/2024   Referring: No referring provider defined for this encounter.   PCP: Rosina LOISE Furnish, NP         Chief Complaint: New Patient      History of Present Illness   [name] [name] is a 68 y.o. White female who presents for hospital follow up. She is an established patient of Dr. Jodean.     She has past medical history of hyperlipidemia, hypertension, T2DM, GERD.     She was hospitalized 11/15/2023 through 11/21/2023. She presented hyperglycemia, hypertension and bilateral lower extremity edema. Cardiology was consulted secondary to elevated RVSP. Echocardiogram showed estimated RVSP to be at upper limits of normal range with normal left ventricular systolic function. Patient did have bilateral lower extremity edema and presentation but he had been taking amlodipine  at home.   Medications were adjusted. Amlodipine  was held and metoprolol  was changed to Carvedilol .     She states she is feeling better at this time. She has had no worsening shortness of breath. Lower extremity edema has improved. She continues to work on her blood glucose level. She is scheduled for sleep study and is following with pulmonary.     She denies any chest pain, worsening dyspnea, palpitations or syncope.     Patient Active Problem List    Diagnosis Date Noted    Poorly controlled type 2 diabetes mellitus 01/08/2024    Other hyperlipidemia 01/08/2024    Hyperglycemia 11/20/2023    Acute hypoxic respiratory failure (CMS HCC) 11/17/2023    Chronic bronchitis 11/17/2023    Snoring 11/17/2023    Diabetes mellitus type 2, insulin  dependent (CMS HCC) 11/17/2023    Hypertensive heart disease without heart failure 11/17/2023    Gastroesophageal reflux disease without esophagitis  11/17/2023    Hypertension, unspecified type 11/17/2023    Dependent edema 11/16/2023    Uncomplicated asthma, unspecified asthma severity, unspecified whether persistent 11/16/2023    Lung nodules 11/16/2023    Shortness of breath 11/15/2023    Sepsis 02/21/2023    Chest pain 02/23/2021    Breast pain, left 02/23/2021    Breast pain, right 02/23/2021    Family history of breast cancer 02/23/2021       Allergies  Allergies[1]    Medications  Current Medications[2]    History  Past Medical History:   Diagnosis Date    Asthma     Diabetes mellitus, type 2     Fatty liver     HTN (hypertension)     Hyperlipidemia          Past Surgical History:   Procedure Laterality Date    HX CESAREAN SECTION      HX TONSIL AND ADENOIDECTOMY  1965     Social History     Socioeconomic History    Marital status: Married   Tobacco Use    Smoking status: Former     Current packs/day: 0.00     Average packs/day: 1 pack/day for 29.2 years (29.2 ttl pk-yrs)     Types: Cigarettes     Start date: 60     Quit date: 04/07/1997     Years since quitting: 26.8    Smokeless  tobacco: Never   Vaping Use    Vaping status: Never Used   Substance and Sexual Activity    Alcohol use: Never    Drug use: Never     Social Determinants of Health     Social Connections: Low Risk (11/16/2023)    Social Connections     SDOH Social Isolation: 5 or more times a week     Family Medical History:       Problem Relation (Age of Onset)    Cancer Brother    Depression Sister    Diabetes Sister, Brother    Heart Disease Sister, Brother    Hypertension (High Blood Pressure) Sister    Osteoporosis Sister              Review of Systems:  General: Denies fever or chills. No fatigue.   CNS: Denies syncope or dizziness. No headache.  Eyes: Denies recent visual changes. No pain.  Ears, Nose, and Throat: Denies ear pain, nasal congestion, or sore throat.  Endocrine: Denies excessive hunger or thirst. No heat/cold intolerance.  Respiratory: + chronic shortness of breath. No  cough.  Cardiovascular: Per HPI Denies claudication or varicose veins.  GI: Denies nausea/vomiting. No black/bloody bowel movements.  GU: Denies hematuria or dysuria. No incontinence.  Musculoskeletal: Denies joint swelling or decreased ROM.  Skin: No rashes, hives, or eczema.  Psychiatric: Denies anxiety, depression, or insomnia.  Hematologic: No bleedings disorders or swollen nodes.    All other systems reviewed and either negative or not pertinent.     Physical Examination:  There were no vitals taken for this visit.      General: Awake. Well nourished. No acute distress. A&O x 3.  HEENT: Normocephalic, atraumatic. PERRL. Normal hearing.  Neck: No JVD or bruit. Trachea midline.  Lungs: Clear to auscultation. No rhonchi or wheezing noted. Unlabored.  Heart: Regular rate and rhythm. No murmur, rub, or gallop.  Abdomen: BS normal x 4 quadrants. Soft, non-tender, non-distended.  Extremities: No clubbing or cyanosis. No edema.  Psychiatric: Cooperative, appropriate mood and affect.  Musculoskeletal: Strength grips are equal. No red, swollen joints. MAE.  Skin: Warm, dry, and intact. No rashes or hives are noted.       Diagnostics      Echo:  11/16/2023  Indications: Dyspnea     Conclusions:  The study images were of technically adequate quality.  The left ventricular ejection fraction by visual assessment is estimated to be 60-65%.  Moderate left ventricular hypertrophy.  Right ventricular systolic pressure is .     Findings  Left Ventricle:   Normal left ventricular size. Moderate left ventricular hypertrophy. The left ventricular ejection fraction by visual assessment is estimated to be 60-65%.  Right Ventricle:   Normal right ventricular size. Normal right ventricular systolic function. RV systolic pressure is at the upper limits of normal. Right ventricular systolic pressure is .  Left Atrium:   The left atrium is normal in size.  Right Atrium:   The right atrium is of normal size.  Mitral Valve:   The  mitral valve is normal. No evidence of mitral stenosis. There is mild mitral regurgitation.  Tricuspid Valve:   Trace tricuspid regurgitation present. There is no evidence of tricuspid stenosis.  Aortic Valve:   The aortic valve is not well visualized. No Aortic valve stenosis. There is no evidence of aortic regurgitation.  Pulmonic Valve:   The pulmonic valve is not well visualized. There is no evidence of pulmonic valve stenosis.  IVC/Hepatic Veins:   Normal IVC size with >50% inspiratory collapse (estimated RA pressure 3 mmHg).  Aorta:   The aortic root is of normal size. The ascending aorta is normal in size.  Pericardium/Pleural space:   No significant pericardial effusion demonstrated.      No orders of the defined types were placed in this encounter.      There are no discontinued medications.    Assessment and Plan:  Assessment/Plan   1. Hypertensive heart disease without heart failure    2. Other hyperlipidemia    3. DM (diabetes mellitus)        PLAN:   Patient is doing well from cardiac standpoint. She has dyspnea with exertion but denies any other anginal symptoms. She is scheduled for sleep study with pulmonary in January 2026. Will plan to see her back in the office in 6 months unless she has needs sooner. She is agreeable and verbalized understanding.       Follow up:  No follow-ups on file.      The patient was given the opportunity to ask questions and those questions were answered to the patient's satisfaction. The patient was encouraged to call with any additional questions or concerns. Discussed with the effects and side effects of medications. Medication safety was discussed. The patient was informed to contact the office within 7 business days if a message/lab results/referral/imaging results have not been conveyed to the patient.       Aldona Lab, APRN, CNP  [facility]  Cardiology  [facility]    A portion of this documentation may have been generated using [facility]  voice recognition software and may contain syntax/voice recognition errors.              [1]   Allergies  Allergen Reactions    Bee Venom Protein (Honey Bee)     Ceftriaxone   Other Adverse Reaction (Add comment)     Unsure if it was a steriod or ceftriaxone . It was given at same time. Tongue swelling reported previously     Dilaudid [Hydromorphone]     Flu Vaccine [Influenza Virus Vaccines]    [2]   Current Outpatient Medications:     albuterol  (ACCUNEB ) 0.63 mg/3 mL Inhalation Solution for Nebulization, Take 3 mL (0.63 mg total) by nebulization Four times a day as needed for Wheezing, Disp: 75 mL, Rfl: 3    albuterol  sulfate (PROVENTIL  OR VENTOLIN  OR PROAIR ) 90 mcg/actuation Inhalation oral inhaler, Take 1-2 Puffs by inhalation Every 4 hours as needed Indications: bronchospasm prevention, Disp: 3 Each, Rfl: 3    Amlodipine -Olmesartan 5-40 mg Oral Tablet, Take 1 Tablet by mouth Daily, Disp: , Rfl:     aspirin  (ECOTRIN) 81 mg Oral Tablet, Delayed Release (E.C.), Take 1 Tablet (81 mg total) by mouth Once a day, Disp: , Rfl:     Blood-Glucose Sensor (DEXCOM G7 SENSOR) Does not apply Device, Change q 3 days, Disp: 3 Each, Rfl: 5    budesonide -formoteroL  (SYMBICORT ) 160-4.5 mcg/actuation Inhalation oral inhaler, Take 2 Puffs by inhalation Twice daily Indications: controller medication for asthma, Disp: 30.6 g, Rfl: 3    cetirizine  (ZYRTEC ) 10 mg Oral Tablet, Take 1 Tablet (10 mg total) by mouth Once per day as needed for Allergies, Disp: , Rfl:     cyclobenzaprine  (FLEXERIL ) 10 mg Oral Tablet, Take 1 Tablet (10 mg total) by mouth Three times a day as needed for Muscle spasms, Disp: , Rfl:     fenofibrate  (LOFIBRA) 160  mg Oral Tablet, Take 1 Tablet (160 mg total) by mouth Daily, Disp: , Rfl:     fluticasone  propionate (FLONASE ) 50 mcg/actuation Nasal Spray, Suspension, Administer 2 Sprays into each nostril Daily, Disp: , Rfl:     hydroCHLOROthiazide  (HYDRODIURIL ) 25 mg Oral Tablet, Take 1 Tablet (25 mg total) by mouth  Daily, Disp: 30 Tablet, Rfl: 0    insulin  aspart U-100 (NOVOLOG FLEXPEN U-100 INSULIN ) 100 unit/mL (3 mL) Subcutaneous Insulin  Pen, Inject under the skin Twice a day before meals, Disp: , Rfl:     JARDIANCE 25 mg Oral Tablet, Take 1 Tablet (25 mg total) by mouth Daily, Disp: , Rfl:     omeprazole (PRILOSEC) 40 mg Oral Capsule, Delayed Release(E.C.), Take 1 Capsule (40 mg total) by mouth Daily, Disp: , Rfl:     spironolactone  (ALDACTONE ) 50 mg Oral Tablet, Take 1 Tablet (50 mg total) by mouth Every morning with breakfast, Disp: 30 Tablet, Rfl: 0    tirzepatide  (MOUNJARO ) 5 mg/0.5 mL Subcutaneous Pen Injector, Inject 0.5 mL (5 mg total) under the skin Every 7 days, Disp: 2 mL, Rfl: 5    TRESIBA  FLEXTOUCH U-100 100 unit/mL (3 mL) Subcutaneous Insulin  Pen, Inject 30 u qhs, Disp: 9 mL, Rfl: 3

## 2024-01-25 ENCOUNTER — Encounter (INDEPENDENT_AMBULATORY_CARE_PROVIDER_SITE_OTHER): Payer: Self-pay

## 2024-01-29 ENCOUNTER — Telehealth (HOSPITAL_COMMUNITY): Payer: Self-pay | Admitting: Internal Medicine

## 2024-01-29 NOTE — Telephone Encounter (Signed)
(   1st attempt) Diabetes Education Referral I called [facility] Diabetes Education Program [phone] - I spoke to [place] referral received the office did speak to the patient on 01/22/2024 the patient was going to call back to be scheduled have not heard back .   I called the patient I spoke to the patient I did conference call the office spoke to [place] . Patient is scheduled 1-20/2026 at 12:00 PM .     Melissa Duffy  01/29/2024 13:48

## 2024-02-16 ENCOUNTER — Other Ambulatory Visit: Payer: Self-pay

## 2024-02-16 ENCOUNTER — Encounter (HOSPITAL_BASED_OUTPATIENT_CLINIC_OR_DEPARTMENT_OTHER): Payer: Self-pay | Admitting: Internal Medicine

## 2024-02-16 ENCOUNTER — Ambulatory Visit: Payer: Self-pay | Attending: Internal Medicine | Admitting: Internal Medicine

## 2024-02-16 DIAGNOSIS — Z794 Long term (current) use of insulin: Secondary | ICD-10-CM

## 2024-02-16 DIAGNOSIS — E7849 Other hyperlipidemia: Secondary | ICD-10-CM | POA: Insufficient documentation

## 2024-02-16 DIAGNOSIS — E1165 Type 2 diabetes mellitus with hyperglycemia: Secondary | ICD-10-CM | POA: Insufficient documentation

## 2024-02-16 NOTE — Nursing Note (Signed)
 02/16/24 1000   Required: Location Test Performed At:   [facility] - [address] [place], [place] [address]   Glucose  (Fingerstick)   Time Performed 1050   Glucose (Ref Range 74-106 mg/dl) 786   Lot # [id number]   Expiration Date 02/12/25   Internal Control Valid Yes   Initials sw

## 2024-02-16 NOTE — Patient Instructions (Signed)
 patient advised to be compliant with medications and monitoring blood sugar  advised to have a yearly eye exam   advised about foot care and see a podiatrist if necessary  explained short term and long term complications of uncontrolled diabetes   discussed symptoms and signs of hypoglycemia and hyperglycemia and what to do in each situation   discussed targets for lipids  discussed target for BP  discussed targets for blood sugars and HBA1C taking consideration age ,duration of diabetes ,risk of hypoglycemia cardiac and neurological factors

## 2024-02-16 NOTE — Procedures (Signed)
 [facility]Y, [facility]  [address]  [place] [address]  Operated by [facility]  Procedure Note    Name: [name] [name] MRN:  [id number]   Date: 02/16/2024 DOB:  06-29-1955 (68 y.o.)         95251 - INTERPRETATION/REPORT CONTINUOUS GLUCOSE MONITORING VIA SUBCUTANEOUS SENSOR (AMB ONLY)    Performed by: Gifford Night, MD  Authorized by: Gifford Night, MD    cgm Sensor  Type dexcom  Average days of CGM 29  Average blood glucose 219  Standard Deviation 56    27 % in range (70-180 mg/dl)  50% high (819 - 749 mg/dl)  75% very high (>749 mg/dl)  0% low (70 -54 mg/dl)  0 % very low (<45 mg/dl)   BS high all time more so evenings   Night Gifford, MD

## 2024-02-16 NOTE — Progress Notes (Signed)
 OUTPATIENT PROGRESS  NOTE  CC: Diabetes Mellitus, type 2  Subjective:   The history is provided by the patient.   Diabetes  She presents for her follow-up diabetic visit. She has type 2 diabetes mellitus. Her disease course has been fluctuating. There are no hypoglycemic associated symptoms. Associated symptoms include fatigue and foot paresthesias. Pertinent negatives for diabetes include no blurred vision, no chest pain, no foot ulcerations, no polydipsia, no polyphagia, no visual change, no weakness and no weight loss. (Nocturia 3-4x ) There are no hypoglycemic complications. 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 Pertinent negatives for diabetic complications include no CVA, heart disease, nephropathy, PVD or retinopathy. Current diabetic treatment includes insulin  injections and oral agent (dual therapy). She is compliant with treatment most of the time. Her weight is stable. She is following a generally healthy diet. She participates in exercise intermittently. Her home blood glucose trend is fluctuating dramatically. An ACE inhibitor/angiotensin II receptor blocker is being taken. She does not see a podiatrist.Eye exam is not current.      [name] [name], 23-Jan-1956,  is a 69 y.o. female who presents to the endocrine clinic today for follow up  of Type 2 Diabetes Mellitus. Onset DM 2013.  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 HgA1C 9.4%.     Has diabetes and hyperlipidemia   History of diabetes since 2013  On insulin  since oct 2025   Currently on tresiba  32u qhs and Novolog s/s taking it 2-3xqd   Also on mounjaro  5 mg q week and jardiance 25 mg no metformin caused stomack issues having major constipation from mounjaro    Hba1c last month 9.4 was  9.7  was 8.8 sept 26   Since last visit 6 weeks ago no hospitalization no surgery had shots in hip 10 days ago   Started dexcom   BS records reviewed  Lowest BS 145  Highest BS  400  Average BS 219  BS in office today 213    Hypoglycemia is not occurring  Last Diabetic Eye Exam: 2023 no retinopathy has apointment soon   To see a dietitan next week   Was in hospital in oct with really high BS and started on insulin  then had sob cardiac workup ok had also high BP   ROS:   Review of Systems   Constitutional:  Positive for fatigue. Negative for weight loss.   Eyes:  Negative for blurred vision.   Cardiovascular:  Negative for chest pain.   Endocrine: Negative for polydipsia and polyphagia.   Neurological:  Negative for weakness.            Labs:  Reviewed    11/17/2023 cr 0.58 gluc 367 ca 8.9hba1c 9.7  10/18/2023 hba1c 9.4  Problem List / Past Medical History:   Problem List[1]     Medications:     Current Outpatient Medications   Medication Sig    albuterol  (ACCUNEB ) 0.63 mg/3 mL Inhalation Solution for Nebulization Take 3 mL (0.63 mg total) by nebulization Four times a day as needed for Wheezing    albuterol  sulfate (PROVENTIL  OR VENTOLIN  OR PROAIR ) 90 mcg/actuation Inhalation oral inhaler Take 1-2 Puffs by inhalation Every 4 hours as needed Indications: bronchospasm prevention    Amlodipine -Olmesartan 5-40 mg Oral Tablet Take 1 Tablet by mouth Daily    aspirin  (ECOTRIN) 81 mg Oral Tablet, Delayed Release (E.C.) Take 1 Tablet (81 mg total) by mouth Once a day    Blood-Glucose Sensor (DEXCOM G7 SENSOR) Does  not apply Device Change q 3 days    budesonide -formoteroL  (SYMBICORT ) 160-4.5 mcg/actuation Inhalation oral inhaler Take 2 Puffs by inhalation Twice daily Indications: controller medication for asthma    cetirizine  (ZYRTEC ) 10 mg Oral Tablet Take 1 Tablet (10 mg total) by mouth Once per day as needed for Allergies    cyclobenzaprine  (FLEXERIL ) 10 mg Oral Tablet Take 1 Tablet (10 mg total) by mouth Three times a day as needed for Muscle spasms    fenofibrate  (LOFIBRA) 160 mg Oral Tablet Take 1 Tablet (160 mg total) by mouth Daily    fluticasone  propionate (FLONASE ) 50 mcg/actuation Nasal  Spray, Suspension Administer 2 Sprays into each nostril Daily    hydroCHLOROthiazide  (HYDRODIURIL ) 25 mg Oral Tablet Take 1 Tablet (25 mg total) by mouth Daily    insulin  aspart U-100 (NOVOLOG FLEXPEN U-100 INSULIN ) 100 unit/mL (3 mL) Subcutaneous Insulin  Pen Inject under the skin Twice a day before meals    JARDIANCE 25 mg Oral Tablet Take 1 Tablet (25 mg total) by mouth Daily    linaCLOtide (LINZESS) 145 mcg Oral Capsule Take 1 Capsule (145 mcg total) by mouth Every morning    omeprazole (PRILOSEC) 40 mg Oral Capsule, Delayed Release(E.C.) Take 1 Capsule (40 mg total) by mouth Daily    spironolactone  (ALDACTONE ) 50 mg Oral Tablet Take 1 Tablet (50 mg total) by mouth Every morning with breakfast    tirzepatide  (MOUNJARO ) 5 mg/0.5 mL Subcutaneous Pen Injector Inject 0.5 mL (5 mg total) under the skin Every 7 days    TRESIBA  FLEXTOUCH U-100 100 unit/mL (3 mL) Subcutaneous Insulin  Pen Inject 30 u qhs       Allergies:   Allergies[2]    Social History:   Social History[3]    Objective:   BP (!) 148/72   Pulse 62   Temp 36.9 C (98.4 F)   Ht 1.6 m (5' 3)   Wt 85.7 kg (189 lb)   SpO2 99%   BMI 33.48 kg/m       Physical Exam:  Physical Exam  Constitutional:       Appearance: Normal appearance. She is obese.   HENT:      Head: Normocephalic.   Neck:      Thyroid: No thyroid mass, thyromegaly or thyroid tenderness.   Cardiovascular:      Rate and Rhythm: Normal rate and regular rhythm.      Heart sounds: Normal heart sounds. No murmur heard.  Pulmonary:      Effort: Pulmonary effort is normal.      Breath sounds: Normal breath sounds.   Lymphadenopathy:      Cervical: No cervical adenopathy.   Neurological:      General: No focal deficit present.      Mental Status: She is alert.      Cranial Nerves: No cranial nerve deficit.   Psychiatric:         Attention and Perception: Attention normal.         Mood and Affect: Mood normal.         Speech: Speech normal.         Behavior: Behavior normal. Behavior is  cooperative.         Cognition and Memory: Cognition and memory normal.         Judgment: Judgment normal.                    Assessment & Plan:   (E11.65) Poorly controlled type 2 diabetes mellitus  (  primary encounter diagnosis)  Plan: 04748 - INTERPRETATION/REPORT CONTINUOUS         GLUCOSE MONITORING VIA SUBCUTANEOUS SENSOR (AMB        ONLY), POCT Blood Glucose/Fingerstick (AMB),         POCT Blood Glucose/Fingerstick (AMB), POCT HGB         A1C, COMPREHENSIVE METABOLIC PNL, FASTING,         HGA1C (HEMOGLOBIN A1C WITH EST AVG GLUCOSE),         MICROALBUMIN/CREATININE RATIO, URINE, RANDOM,         LIPID PANEL, CREATINE KINASE (CK), TOTAL, SERUM        OR PLASMA, THYROID STIMULATING HORMONE WITH         FREE T4 REFLEX, C-PEPTIDE    (E78.49) Other hyperlipidemia  Plan: COMPREHENSIVE METABOLIC PNL, FASTING, HGA1C         (HEMOGLOBIN A1C WITH EST AVG GLUCOSE),         MICROALBUMIN/CREATININE RATIO, URINE, RANDOM,         LIPID PANEL, CREATINE KINASE (CK), TOTAL, SERUM        OR PLASMA, THYROID STIMULATING HORMONE WITH         FREE T4 REFLEX, C-PEPTIDE       Orders Placed This Encounter    95251 - INTERPRETATION/REPORT CONTINUOUS GLUCOSE MONITORING VIA SUBCUTANEOUS SENSOR (AMB ONLY)    COMPREHENSIVE METABOLIC PNL, FASTING    HGA1C (HEMOGLOBIN A1C WITH EST AVG GLUCOSE)    MICROALBUMIN/CREATININE RATIO, URINE, RANDOM    LIPID PANEL    CREATINE KINASE (CK), TOTAL, SERUM OR PLASMA    THYROID STIMULATING HORMONE WITH FREE T4 REFLEX    C-PEPTIDE    POCT Blood Glucose/Fingerstick (AMB)    POCT Blood Glucose/Fingerstick (AMB)    POCT HGB A1C     Increase tresiba  to 38u qam   Take Novolog 6u with carb meals in addition to s/s   Get eye exam   RTC 76m with labs   Erick Isles MD                     [1]   Patient Active Problem List  Diagnosis    Chest pain    Breast pain, left    Breast pain, right    Family history of breast cancer    Sepsis    Shortness of breath    Dependent edema    Uncomplicated asthma, unspecified asthma  severity, unspecified whether persistent    Lung nodules    Acute hypoxic respiratory failure (CMS HCC)    Chronic bronchitis    Snoring    Diabetes mellitus type 2, insulin  dependent (CMS HCC)    Hypertensive heart disease without heart failure    Gastroesophageal reflux disease without esophagitis    Hypertension, unspecified type    Hyperglycemia    Poorly controlled type 2 diabetes mellitus    Other hyperlipidemia   [2]   Allergies  Allergen Reactions    Bee Venom Protein (Honey Bee)     Ceftriaxone   Other Adverse Reaction (Add comment)     Unsure if it was a steriod or ceftriaxone . It was given at same time. Tongue swelling reported previously     Dilaudid [Hydromorphone]     Flu Vaccine [Influenza Virus Vaccines]    [3]   Social History  Tobacco Use    Smoking status: Former     Current packs/day: 0.00     Average packs/day: 1 pack/day for 29.2 years (29.2 ttl  pk-yrs)     Types: Cigarettes     Start date: 57     Quit date: 04/07/1997     Years since quitting: 26.8    Smokeless tobacco: Never   Vaping Use    Vaping status: Never Used   Substance Use Topics    Alcohol use: Never    Drug use: Never

## 2024-02-19 ENCOUNTER — Other Ambulatory Visit: Payer: Self-pay

## 2024-02-19 ENCOUNTER — Ambulatory Visit
Admission: RE | Admit: 2024-02-19 | Discharge: 2024-02-19 | Disposition: A | Payer: Self-pay | Source: Ambulatory Visit | Attending: PULMONARY DISEASE

## 2024-02-19 DIAGNOSIS — G4734 Idiopathic sleep related nonobstructive alveolar hypoventilation: Secondary | ICD-10-CM | POA: Insufficient documentation

## 2024-02-19 DIAGNOSIS — G4719 Other hypersomnia: Secondary | ICD-10-CM | POA: Insufficient documentation

## 2024-02-20 NOTE — Procedures (Signed)
 [facility]    Sleep Study    Patient Name: [name] [name]  Date of Birth: 10/04/1955  Gender: female  Provider: Bernardino Hoyle, DO  Location: [facility]  Location Address: [address] [place], [address]  [facility]  Location Phone: [phone] [facility]      Primary Care Provider: Rosina LOISE Furnish, NP  Referring Provider: Trey Eck, MD    Sleep Study    Past Medical History  Past Medical History:   Diagnosis Date    Asthma     Diabetes mellitus, type 2     Fatty liver     HTN (hypertension)     Hyperlipidemia            Past Surgical History  Past Surgical History:   Procedure Laterality Date    HX CESAREAN SECTION      HX TONSIL AND ADENOIDECTOMY  1965           Medication LIst  No outpatient medications have been marked as taking for the 02/19/24 encounter [facility] Encounter) with SLEEP LAB, RM 3 PRN NEURODIAGNOSTICS.       Allergy List  Allergies[1]    Family Medical History  Family Medical History:       Problem Relation (Age of Onset)    Cancer Brother    Depression Sister    Diabetes Sister, Brother    Heart Disease Sister, Brother    Hypertension (High Blood Pressure) Sister    Osteoporosis Sister              Social History  Social History     Socioeconomic History    Marital status: Married   Tobacco Use    Smoking status: Former     Current packs/day: 0.00     Average packs/day: 1 pack/day for 29.2 years (29.2 ttl pk-yrs)     Types: Cigarettes     Start date: 37     Quit date: 04/07/1997     Years since quitting: 26.8    Smokeless tobacco: Never   Vaping Use    Vaping status: Never Used   Substance and Sexual Activity    Alcohol use: Never    Drug use: Never     Social Determinants of Health     Social Connections: Low Risk (11/16/2023)    Social Connections     SDOH Social Isolation: 5 or more times a week           Patient Information  Sleep apnea symptoms: Excessive Daytime Sleepiness and Snoring  Patient's Height: 63  Patient's Weight: 189  Patient's BMI:  33.5  Neck Circumference: 16  Epworth Scale Score: 18    Date of Study: 02/19/2024  Study Performed By: LOIS Specking, LPN   Study Reviewed By: Izetta Mulders, RRT, RPSGT, CCSH    Procedure : Procedure: Complete PSG with digital sleep system using the international 10-20 electrode placement for recording EEG, EOG, EMG from chin, ECG, Respiratory effort, oximetry, body position, airflow, snoring sound, pulse rate, and limb movement channels.    Sleep Architecture:   Total Recording Time: 410.5 min.  Total Sleep Time: 273.5 min.   Sleep Efficiency Percentage: 60.5%.   Patient Sleep Latency (Minutes): 41.7 min.   Patient REM Latency (Minutes): 192.0 min.   Percentage of Stage I Sleep:  1.6 %.   Percentage of Stage II Sleep: 27.6 %.   Percentage of Stage III Sleep: 39.9 %.   Percentage (%) of REM  sleep: 30.9 %.     Respiratory Data:   Number of Obstructive Apneas Observed: 16 .   Number of Central Apneas Observed: 1.   Number of Mixed Apneas Observed: 0.   Total Number of Apneas: 17.   Total number of Hypopneas: 55.   Snoring During the Study: Yes, moderate    Apnea Hypopnea Index (AHI) per hour: 15.8.   Percent of Time Patient Spent in Supine Position: 0.0 %.  AHI in Supine Position #: 0.0  Non-supine AHI #: 23.7.   REM AHI #: 36.9.   Non-REM AHI #:  6.0.   Lowest Desaturation Percentage: 73%.   Total Amount of Desaturated Time Below or Equal to 6.9 minutes .        Cardiac Data:   The Mean Heart Rate During the Study (Beats/Min): 64.4.   Sinus Rhythm: Normal     Periodic Leg Movements:   Total Number of Periodic Leg Movements During the Study: 29.   PLM Index #: 6.4.    The study was scored by: 30 second EPOCH, 4 % desaturation and  CMS guidelines.AASM Accredited     Assessment:   Diagnosis:   (R09.02) Hypoxemia ([id number]), (R06.83) Snoring (SCT), and (G47.33) OSA - Obstructive sleep apnea (SCT)    PLAN  Procedure Orders  In Lab Titration    Instructions:   Patient advised to avoid sedatives, hypnotics, sedating  antihistamines and alcohol until CPAP titration is completed and therapy initiated., Reviewed raw sleep data and agree with therapeutic directions outlined in report., The natural history of obstructive sleep apnea and its tendency to be associated with cardiac, pulmonary, neuropsychiatric discussed in detail with patient., The etiology of OSA was discussed in detail with patient, Driving while drowsy was discussed in detail, Reviews of trustworthy websites recommended, All benefits of CPAP/BiPAP discussed in detail, Advised to call if any difficulties or questions regarding CPAP/BiPAP device, Positional Therapy may be helpful in snoring, and Nasal steroids may be helpful for primary snoring    Impression  Ahi 15.8 per hour, Moderate OSA, and Patient will need an in lab titration study  Lowest Sp02 recorded was 73%.  Sp02 less than 88% was 6.9 minutes  Sleep efficiency 60.5%    Vina Docker, LPN      Pulmonary Attending:  I have personally reviewed the raw data for the sleep study and  I agree with the assessment and plan as above.    Scoring seem appropriate.   Moderate sleep apnea noted with AHI 15.8 events per hour  We will proceed with in-lab titration study  Will need to follow up in the office to review face to face within 90 days.     Bernardino LITTIE Hoyle, DO  02/22/2024 08:01             [1]   Allergies  Allergen Reactions    Bee Venom Protein (Honey Bee)     Ceftriaxone   Other Adverse Reaction (Add comment)     Unsure if it was a steriod or ceftriaxone . It was given at same time. Tongue swelling reported previously     Dilaudid [Hydromorphone]     Flu Vaccine [Influenza Virus Vaccines]

## 2024-02-22 DIAGNOSIS — G4733 Obstructive sleep apnea (adult) (pediatric): Secondary | ICD-10-CM

## 2024-02-22 DIAGNOSIS — G4734 Idiopathic sleep related nonobstructive alveolar hypoventilation: Secondary | ICD-10-CM

## 2024-03-06 ENCOUNTER — Other Ambulatory Visit (INDEPENDENT_AMBULATORY_CARE_PROVIDER_SITE_OTHER): Payer: Self-pay | Admitting: PULMONARY DISEASE

## 2024-03-06 DIAGNOSIS — G4733 Obstructive sleep apnea (adult) (pediatric): Secondary | ICD-10-CM

## 2024-03-27 ENCOUNTER — Ambulatory Visit (INDEPENDENT_AMBULATORY_CARE_PROVIDER_SITE_OTHER): Payer: Self-pay | Admitting: Radiology

## 2024-03-27 ENCOUNTER — Ambulatory Visit (INDEPENDENT_AMBULATORY_CARE_PROVIDER_SITE_OTHER): Payer: Self-pay | Admitting: PULMONARY DISEASE

## 2024-04-08 ENCOUNTER — Ambulatory Visit (HOSPITAL_BASED_OUTPATIENT_CLINIC_OR_DEPARTMENT_OTHER): Payer: Self-pay | Admitting: Internal Medicine

## 2024-05-16 ENCOUNTER — Ambulatory Visit (HOSPITAL_BASED_OUTPATIENT_CLINIC_OR_DEPARTMENT_OTHER): Payer: Self-pay | Admitting: Internal Medicine

## 2024-06-14 ENCOUNTER — Ambulatory Visit (HOSPITAL_BASED_OUTPATIENT_CLINIC_OR_DEPARTMENT_OTHER): Payer: Self-pay | Admitting: Internal Medicine

## 2024-07-18 ENCOUNTER — Ambulatory Visit (HOSPITAL_BASED_OUTPATIENT_CLINIC_OR_DEPARTMENT_OTHER): Payer: Self-pay | Admitting: Internal Medicine

## 2024-07-24 ENCOUNTER — Ambulatory Visit (INDEPENDENT_AMBULATORY_CARE_PROVIDER_SITE_OTHER)

## 2024-07-24 ENCOUNTER — Ambulatory Visit (INDEPENDENT_AMBULATORY_CARE_PROVIDER_SITE_OTHER): Payer: Self-pay
